# Patient Record
Sex: Female | Born: 1965 | Race: White | Hispanic: No | Marital: Married | Smoking: Never smoker
Health system: Southern US, Community
[De-identification: ages and names within clinical notes are randomized; demographics above are authoritative.]

## PROBLEM LIST (undated history)

## (undated) DIAGNOSIS — F419 Anxiety disorder, unspecified: Secondary | ICD-10-CM

## (undated) DIAGNOSIS — D649 Anemia, unspecified: Secondary | ICD-10-CM

## (undated) DIAGNOSIS — A6000 Herpesviral infection of urogenital system, unspecified: Secondary | ICD-10-CM

## (undated) DIAGNOSIS — E785 Hyperlipidemia, unspecified: Secondary | ICD-10-CM

## (undated) DIAGNOSIS — K219 Gastro-esophageal reflux disease without esophagitis: Secondary | ICD-10-CM

## (undated) HISTORY — DX: Gastro-esophageal reflux disease without esophagitis: K21.9

## (undated) HISTORY — DX: Anxiety disorder, unspecified: F41.9

## (undated) HISTORY — DX: Anemia, unspecified: D64.9

## (undated) HISTORY — DX: Hyperlipidemia, unspecified: E78.5

## (undated) HISTORY — DX: Herpesviral infection of urogenital system, unspecified: A60.00

---
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
## 1998-12-23 ENCOUNTER — Ambulatory Visit (HOSPITAL_COMMUNITY): Admission: RE | Admit: 1998-12-23 | Discharge: 1998-12-23 | Payer: Self-pay | Admitting: Neurosurgery

## 1998-12-23 ENCOUNTER — Encounter: Payer: Self-pay | Admitting: Neurosurgery

## 1999-04-24 ENCOUNTER — Ambulatory Visit (HOSPITAL_COMMUNITY): Admission: RE | Admit: 1999-04-24 | Discharge: 1999-04-24 | Payer: Self-pay | Admitting: *Deleted

## 1999-05-23 ENCOUNTER — Ambulatory Visit (HOSPITAL_COMMUNITY): Admission: RE | Admit: 1999-05-23 | Discharge: 1999-05-23 | Payer: Self-pay | Admitting: Neurosurgery

## 1999-05-23 ENCOUNTER — Encounter: Payer: Self-pay | Admitting: Neurosurgery

## 1999-12-06 ENCOUNTER — Encounter: Payer: Self-pay | Admitting: Neurosurgery

## 1999-12-06 ENCOUNTER — Ambulatory Visit (HOSPITAL_COMMUNITY): Admission: RE | Admit: 1999-12-06 | Discharge: 1999-12-06 | Payer: Self-pay | Admitting: Neurosurgery

## 2000-08-11 ENCOUNTER — Other Ambulatory Visit: Admission: RE | Admit: 2000-08-11 | Discharge: 2000-08-11 | Payer: Self-pay | Admitting: Obstetrics and Gynecology

## 2000-08-11 ENCOUNTER — Encounter: Admission: RE | Admit: 2000-08-11 | Discharge: 2000-08-11 | Payer: Self-pay | Admitting: *Deleted

## 2000-08-13 ENCOUNTER — Other Ambulatory Visit: Admission: RE | Admit: 2000-08-13 | Discharge: 2000-08-13 | Payer: Self-pay | Admitting: *Deleted

## 2000-08-20 ENCOUNTER — Encounter: Payer: Self-pay | Admitting: Obstetrics and Gynecology

## 2000-08-20 ENCOUNTER — Ambulatory Visit (HOSPITAL_COMMUNITY): Admission: RE | Admit: 2000-08-20 | Discharge: 2000-08-20 | Payer: Self-pay | Admitting: Obstetrics and Gynecology

## 2002-04-26 ENCOUNTER — Ambulatory Visit (HOSPITAL_COMMUNITY): Admission: RE | Admit: 2002-04-26 | Discharge: 2002-04-26 | Payer: Self-pay | Admitting: Gastroenterology

## 2003-01-02 ENCOUNTER — Other Ambulatory Visit: Admission: RE | Admit: 2003-01-02 | Discharge: 2003-01-02 | Payer: Self-pay | Admitting: Obstetrics and Gynecology

## 2003-06-10 ENCOUNTER — Encounter: Admission: RE | Admit: 2003-06-10 | Discharge: 2003-06-10 | Payer: Self-pay | Admitting: Neurosurgery

## 2003-06-10 ENCOUNTER — Encounter: Payer: Self-pay | Admitting: Neurosurgery

## 2003-07-09 ENCOUNTER — Encounter: Payer: Self-pay | Admitting: Diagnostic Radiology

## 2003-07-09 ENCOUNTER — Encounter: Admission: RE | Admit: 2003-07-09 | Discharge: 2003-07-09 | Payer: Self-pay | Admitting: Neurosurgery

## 2003-07-09 ENCOUNTER — Encounter: Payer: Self-pay | Admitting: Neurosurgery

## 2003-07-24 ENCOUNTER — Encounter: Admission: RE | Admit: 2003-07-24 | Discharge: 2003-07-24 | Payer: Self-pay | Admitting: Neurosurgery

## 2003-07-24 ENCOUNTER — Encounter: Payer: Self-pay | Admitting: Neurosurgery

## 2004-01-26 ENCOUNTER — Ambulatory Visit (HOSPITAL_COMMUNITY): Admission: RE | Admit: 2004-01-26 | Discharge: 2004-01-26 | Payer: Self-pay | Admitting: Emergency Medicine

## 2004-11-24 ENCOUNTER — Ambulatory Visit (HOSPITAL_COMMUNITY): Admission: RE | Admit: 2004-11-24 | Discharge: 2004-11-24 | Payer: Self-pay | Admitting: Orthopaedic Surgery

## 2004-11-28 ENCOUNTER — Encounter: Admission: RE | Admit: 2004-11-28 | Discharge: 2004-11-28 | Payer: Self-pay | Admitting: Orthopaedic Surgery

## 2005-04-13 ENCOUNTER — Emergency Department (HOSPITAL_COMMUNITY): Admission: EM | Admit: 2005-04-13 | Discharge: 2005-04-13 | Payer: Self-pay | Admitting: Family Medicine

## 2005-04-27 ENCOUNTER — Encounter: Admission: RE | Admit: 2005-04-27 | Discharge: 2005-07-26 | Payer: Self-pay | Admitting: Endocrinology

## 2005-11-20 HISTORY — PX: CHOLECYSTECTOMY: SHX55

## 2005-11-25 ENCOUNTER — Encounter: Admission: RE | Admit: 2005-11-25 | Discharge: 2005-11-25 | Payer: Self-pay | Admitting: Obstetrics and Gynecology

## 2005-11-27 ENCOUNTER — Inpatient Hospital Stay (HOSPITAL_COMMUNITY): Admission: AD | Admit: 2005-11-27 | Discharge: 2005-11-29 | Payer: Self-pay | Admitting: Family Medicine

## 2005-11-27 ENCOUNTER — Emergency Department (HOSPITAL_COMMUNITY): Admission: EM | Admit: 2005-11-27 | Discharge: 2005-11-27 | Payer: Self-pay | Admitting: Family Medicine

## 2005-11-28 ENCOUNTER — Encounter (INDEPENDENT_AMBULATORY_CARE_PROVIDER_SITE_OTHER): Payer: Self-pay | Admitting: Specialist

## 2006-11-26 ENCOUNTER — Encounter: Admission: RE | Admit: 2006-11-26 | Discharge: 2007-02-24 | Payer: Self-pay | Admitting: Endocrinology

## 2007-02-03 ENCOUNTER — Ambulatory Visit (HOSPITAL_BASED_OUTPATIENT_CLINIC_OR_DEPARTMENT_OTHER): Admission: RE | Admit: 2007-02-03 | Discharge: 2007-02-03 | Payer: Self-pay | Admitting: *Deleted

## 2007-02-06 ENCOUNTER — Ambulatory Visit: Payer: Self-pay | Admitting: Internal Medicine

## 2007-02-22 ENCOUNTER — Ambulatory Visit (HOSPITAL_COMMUNITY): Admission: RE | Admit: 2007-02-22 | Discharge: 2007-02-22 | Payer: Self-pay | Admitting: General Surgery

## 2007-02-24 ENCOUNTER — Ambulatory Visit (HOSPITAL_COMMUNITY): Admission: RE | Admit: 2007-02-24 | Discharge: 2007-02-24 | Payer: Self-pay | Admitting: General Surgery

## 2007-03-02 ENCOUNTER — Encounter: Admission: RE | Admit: 2007-03-02 | Discharge: 2007-05-31 | Payer: Self-pay | Admitting: Endocrinology

## 2007-04-13 ENCOUNTER — Emergency Department (HOSPITAL_COMMUNITY): Admission: EM | Admit: 2007-04-13 | Discharge: 2007-04-13 | Payer: Self-pay | Admitting: Family Medicine

## 2007-04-19 ENCOUNTER — Emergency Department (HOSPITAL_COMMUNITY): Admission: EM | Admit: 2007-04-19 | Discharge: 2007-04-19 | Payer: Self-pay | Admitting: Emergency Medicine

## 2007-05-02 ENCOUNTER — Ambulatory Visit: Admission: RE | Admit: 2007-05-02 | Discharge: 2007-05-02 | Payer: Self-pay | Admitting: General Surgery

## 2007-05-03 ENCOUNTER — Encounter: Admission: RE | Admit: 2007-05-03 | Discharge: 2007-05-03 | Payer: Self-pay | Admitting: General Surgery

## 2007-05-05 ENCOUNTER — Ambulatory Visit (HOSPITAL_COMMUNITY): Admission: RE | Admit: 2007-05-05 | Discharge: 2007-05-05 | Payer: Self-pay | Admitting: Surgery

## 2007-06-27 ENCOUNTER — Ambulatory Visit (HOSPITAL_COMMUNITY): Admission: RE | Admit: 2007-06-27 | Discharge: 2007-06-27 | Payer: Self-pay | Admitting: Surgery

## 2007-07-04 ENCOUNTER — Inpatient Hospital Stay (HOSPITAL_COMMUNITY): Admission: RE | Admit: 2007-07-04 | Discharge: 2007-07-06 | Payer: Self-pay | Admitting: General Surgery

## 2007-07-04 HISTORY — PX: GASTRIC BYPASS: SHX52

## 2007-07-07 ENCOUNTER — Ambulatory Visit: Payer: Self-pay | Admitting: Vascular Surgery

## 2007-07-07 ENCOUNTER — Ambulatory Visit (HOSPITAL_COMMUNITY): Admission: RE | Admit: 2007-07-07 | Discharge: 2007-07-07 | Payer: Self-pay | Admitting: General Surgery

## 2007-07-07 ENCOUNTER — Encounter (INDEPENDENT_AMBULATORY_CARE_PROVIDER_SITE_OTHER): Payer: Self-pay | Admitting: General Surgery

## 2007-07-11 ENCOUNTER — Encounter: Admission: RE | Admit: 2007-07-11 | Discharge: 2007-10-09 | Payer: Self-pay | Admitting: Endocrinology

## 2007-08-05 ENCOUNTER — Emergency Department (HOSPITAL_COMMUNITY): Admission: EM | Admit: 2007-08-05 | Discharge: 2007-08-05 | Payer: Self-pay | Admitting: Family Medicine

## 2007-08-11 ENCOUNTER — Encounter: Admission: RE | Admit: 2007-08-11 | Discharge: 2007-08-11 | Payer: Self-pay | Admitting: General Surgery

## 2007-08-30 ENCOUNTER — Ambulatory Visit: Payer: Self-pay | Admitting: Family Medicine

## 2007-08-30 DIAGNOSIS — R5383 Other fatigue: Secondary | ICD-10-CM

## 2007-08-30 DIAGNOSIS — E785 Hyperlipidemia, unspecified: Secondary | ICD-10-CM | POA: Insufficient documentation

## 2007-08-30 DIAGNOSIS — F418 Other specified anxiety disorders: Secondary | ICD-10-CM

## 2007-08-30 DIAGNOSIS — R5381 Other malaise: Secondary | ICD-10-CM

## 2007-08-30 DIAGNOSIS — E282 Polycystic ovarian syndrome: Secondary | ICD-10-CM

## 2007-08-30 HISTORY — DX: Polycystic ovarian syndrome: E28.2

## 2007-08-30 HISTORY — DX: Other specified anxiety disorders: F41.8

## 2007-09-08 ENCOUNTER — Telehealth: Payer: Self-pay | Admitting: Family Medicine

## 2007-09-08 LAB — CONVERTED CEMR LAB
ALT: 77 units/L — ABNORMAL HIGH (ref 0–35)
AST: 64 units/L — ABNORMAL HIGH (ref 0–37)
Albumin: 3.8 g/dL (ref 3.5–5.2)
Alkaline Phosphatase: 76 units/L (ref 39–117)
BUN: 4 mg/dL — ABNORMAL LOW (ref 6–23)
Basophils Absolute: 0 10*3/uL (ref 0.0–0.1)
Basophils Relative: 0.3 % (ref 0.0–1.0)
Bilirubin, Direct: 0.1 mg/dL (ref 0.0–0.3)
CO2: 26 meq/L (ref 19–32)
Calcium: 9.1 mg/dL (ref 8.4–10.5)
Chloride: 107 meq/L (ref 96–112)
Creatinine, Ser: 0.6 mg/dL (ref 0.4–1.2)
Eosinophils Absolute: 0.1 10*3/uL (ref 0.0–0.6)
Eosinophils Relative: 1.8 % (ref 0.0–5.0)
Ferritin: 122.4 ng/mL (ref 10.0–291.0)
GFR calc Af Amer: 142 mL/min
GFR calc non Af Amer: 118 mL/min
Glucose, Bld: 90 mg/dL (ref 70–99)
HCT: 39 % (ref 36.0–46.0)
Hemoglobin: 13.5 g/dL (ref 12.0–15.0)
Iron: 61 ug/dL (ref 42–145)
Lymphocytes Relative: 31.1 % (ref 12.0–46.0)
MCHC: 34.5 g/dL (ref 30.0–36.0)
MCV: 89.1 fL (ref 78.0–100.0)
Monocytes Absolute: 0.3 10*3/uL (ref 0.2–0.7)
Monocytes Relative: 6 % (ref 3.0–11.0)
Neutro Abs: 3.5 10*3/uL (ref 1.4–7.7)
Neutrophils Relative %: 60.8 % (ref 43.0–77.0)
Platelets: 202 10*3/uL (ref 150–400)
Potassium: 3.7 meq/L (ref 3.5–5.1)
RBC: 4.38 M/uL (ref 3.87–5.11)
RDW: 14.9 % — ABNORMAL HIGH (ref 11.5–14.6)
Sodium: 141 meq/L (ref 135–145)
TSH: 1.02 microintl units/mL (ref 0.35–5.50)
Total Bilirubin: 0.7 mg/dL (ref 0.3–1.2)
Total Protein: 6.3 g/dL (ref 6.0–8.3)
Transferrin: 258.2 mg/dL (ref >=212.0)
WBC: 5.7 10*3/uL (ref 4.5–10.5)

## 2007-09-16 ENCOUNTER — Ambulatory Visit: Payer: Self-pay | Admitting: Family Medicine

## 2007-09-23 LAB — CONVERTED CEMR LAB
ALT: 59 units/L — ABNORMAL HIGH (ref 0–35)
AST: 43 units/L — ABNORMAL HIGH (ref 0–37)
Albumin: 4.3 g/dL (ref 3.5–5.2)
Alkaline Phosphatase: 84 units/L (ref 39–117)
Bilirubin, Direct: <0.1 mg/dL (ref 0.0–0.3)
GGT: 10 units/L (ref 7–51)
HCV Ab: NEGATIVE
Hep A IgM: NEGATIVE
Hep B C IgM: NEGATIVE
Hepatitis B Surface Ag: NEGATIVE
Total Bilirubin: 0.6 mg/dL (ref 0.3–1.2)
Total Protein: 6.6 g/dL (ref 6.0–8.3)

## 2007-11-03 ENCOUNTER — Ambulatory Visit (HOSPITAL_COMMUNITY): Admission: RE | Admit: 2007-11-03 | Discharge: 2007-11-03 | Payer: Self-pay | Admitting: Surgery

## 2007-11-08 ENCOUNTER — Encounter: Admission: RE | Admit: 2007-11-08 | Discharge: 2008-02-06 | Payer: Self-pay | Admitting: General Surgery

## 2008-04-04 ENCOUNTER — Encounter: Admission: RE | Admit: 2008-04-04 | Discharge: 2008-04-04 | Payer: Self-pay | Admitting: General Surgery

## 2008-04-26 ENCOUNTER — Emergency Department (HOSPITAL_COMMUNITY): Admission: EM | Admit: 2008-04-26 | Discharge: 2008-04-26 | Payer: Self-pay | Admitting: Emergency Medicine

## 2008-04-29 ENCOUNTER — Emergency Department (HOSPITAL_COMMUNITY): Admission: EM | Admit: 2008-04-29 | Discharge: 2008-04-29 | Payer: Self-pay | Admitting: Family Medicine

## 2008-07-06 ENCOUNTER — Emergency Department (HOSPITAL_COMMUNITY): Admission: EM | Admit: 2008-07-06 | Discharge: 2008-07-06 | Payer: Self-pay | Admitting: Emergency Medicine

## 2008-07-06 ENCOUNTER — Encounter: Admission: RE | Admit: 2008-07-06 | Discharge: 2008-07-06 | Payer: Self-pay | Admitting: General Surgery

## 2008-08-02 ENCOUNTER — Encounter: Payer: Self-pay | Admitting: Family Medicine

## 2008-08-21 ENCOUNTER — Telehealth (INDEPENDENT_AMBULATORY_CARE_PROVIDER_SITE_OTHER): Payer: Self-pay | Admitting: *Deleted

## 2008-08-21 ENCOUNTER — Ambulatory Visit: Payer: Self-pay | Admitting: Family Medicine

## 2008-08-31 ENCOUNTER — Ambulatory Visit: Payer: Self-pay | Admitting: Family Medicine

## 2008-08-31 DIAGNOSIS — E538 Deficiency of other specified B group vitamins: Secondary | ICD-10-CM

## 2008-08-31 DIAGNOSIS — IMO0001 Reserved for inherently not codable concepts without codable children: Secondary | ICD-10-CM

## 2008-08-31 DIAGNOSIS — D509 Iron deficiency anemia, unspecified: Secondary | ICD-10-CM

## 2008-08-31 DIAGNOSIS — E559 Vitamin D deficiency, unspecified: Secondary | ICD-10-CM | POA: Insufficient documentation

## 2008-08-31 HISTORY — DX: Vitamin D deficiency, unspecified: E55.9

## 2008-08-31 HISTORY — DX: Iron deficiency anemia, unspecified: D50.9

## 2008-08-31 LAB — CONVERTED CEMR LAB: Vit D, 1,25-Dihydroxy: 22 — ABNORMAL LOW (ref 30–89)

## 2008-09-03 ENCOUNTER — Telehealth (INDEPENDENT_AMBULATORY_CARE_PROVIDER_SITE_OTHER): Payer: Self-pay | Admitting: *Deleted

## 2008-09-07 ENCOUNTER — Ambulatory Visit: Payer: Self-pay | Admitting: Family Medicine

## 2008-09-14 ENCOUNTER — Ambulatory Visit: Payer: Self-pay | Admitting: Family Medicine

## 2008-09-14 ENCOUNTER — Telehealth (INDEPENDENT_AMBULATORY_CARE_PROVIDER_SITE_OTHER): Payer: Self-pay | Admitting: *Deleted

## 2008-10-05 ENCOUNTER — Ambulatory Visit: Payer: Self-pay | Admitting: Family Medicine

## 2008-10-07 LAB — CONVERTED CEMR LAB
Basophils Absolute: 0 10*3/uL (ref 0.0–0.1)
Basophils Relative: 0.2 % (ref 0.0–3.0)
Eosinophils Absolute: 0.1 10*3/uL (ref 0.0–0.7)
Eosinophils Relative: 1.4 % (ref 0.0–5.0)
HCT: 41.2 % (ref 36.0–46.0)
Hemoglobin: 14.1 g/dL (ref 12.0–15.0)
Lymphocytes Relative: 37.9 % (ref 12.0–46.0)
MCHC: 34.2 g/dL (ref 30.0–36.0)
MCV: 93.4 fL (ref 78.0–100.0)
Monocytes Absolute: 0.3 10*3/uL (ref 0.1–1.0)
Monocytes Relative: 7.5 % (ref 3.0–12.0)
Neutro Abs: 2.3 10*3/uL (ref 1.4–7.7)
Neutrophils Relative %: 53 % (ref 43.0–77.0)
Platelets: 193 10*3/uL (ref 150–400)
RBC: 4.41 M/uL (ref 3.87–5.11)
RDW: 12.1 % (ref 11.5–14.6)
Vitamin B-12: 408 pg/mL (ref 211–911)
WBC: 4.3 10*3/uL — ABNORMAL LOW (ref 4.5–10.5)

## 2008-10-08 ENCOUNTER — Encounter (INDEPENDENT_AMBULATORY_CARE_PROVIDER_SITE_OTHER): Payer: Self-pay | Admitting: *Deleted

## 2008-10-08 LAB — CONVERTED CEMR LAB: Vit D, 1,25-Dihydroxy: 44 (ref 30–89)

## 2008-10-30 ENCOUNTER — Telehealth (INDEPENDENT_AMBULATORY_CARE_PROVIDER_SITE_OTHER): Payer: Self-pay | Admitting: *Deleted

## 2009-04-15 ENCOUNTER — Ambulatory Visit: Payer: Self-pay | Admitting: Family Medicine

## 2009-04-16 ENCOUNTER — Encounter (INDEPENDENT_AMBULATORY_CARE_PROVIDER_SITE_OTHER): Payer: Self-pay | Admitting: *Deleted

## 2009-04-17 ENCOUNTER — Encounter (INDEPENDENT_AMBULATORY_CARE_PROVIDER_SITE_OTHER): Payer: Self-pay | Admitting: *Deleted

## 2009-04-24 ENCOUNTER — Telehealth (INDEPENDENT_AMBULATORY_CARE_PROVIDER_SITE_OTHER): Payer: Self-pay | Admitting: *Deleted

## 2009-07-25 ENCOUNTER — Telehealth (INDEPENDENT_AMBULATORY_CARE_PROVIDER_SITE_OTHER): Payer: Self-pay | Admitting: *Deleted

## 2010-01-07 ENCOUNTER — Ambulatory Visit: Payer: Self-pay | Admitting: Family Medicine

## 2010-01-07 DIAGNOSIS — R319 Hematuria, unspecified: Secondary | ICD-10-CM

## 2010-01-07 HISTORY — DX: Hematuria, unspecified: R31.9

## 2010-01-07 LAB — CONVERTED CEMR LAB
Bilirubin Urine: NEGATIVE
Glucose, Urine, Semiquant: NEGATIVE
Ketones, urine, test strip: NEGATIVE
Nitrite: NEGATIVE
Specific Gravity, Urine: 1.02
Urobilinogen, UA: 0.2
WBC Urine, dipstick: NEGATIVE
pH: 6

## 2010-01-08 ENCOUNTER — Encounter: Payer: Self-pay | Admitting: Family Medicine

## 2010-01-10 ENCOUNTER — Telehealth: Payer: Self-pay | Admitting: Family Medicine

## 2010-01-13 ENCOUNTER — Telehealth (INDEPENDENT_AMBULATORY_CARE_PROVIDER_SITE_OTHER): Payer: Self-pay | Admitting: *Deleted

## 2010-01-21 ENCOUNTER — Ambulatory Visit: Payer: Self-pay | Admitting: Family Medicine

## 2010-01-21 DIAGNOSIS — N390 Urinary tract infection, site not specified: Secondary | ICD-10-CM

## 2010-06-03 ENCOUNTER — Ambulatory Visit: Payer: Self-pay | Admitting: Internal Medicine

## 2010-06-03 DIAGNOSIS — R21 Rash and other nonspecific skin eruption: Secondary | ICD-10-CM | POA: Insufficient documentation

## 2010-06-09 LAB — CONVERTED CEMR LAB
Basophils Absolute: 0 10*3/uL (ref 0.0–0.1)
Basophils Relative: 0.2 % (ref 0.0–3.0)
Eosinophils Absolute: 0.1 10*3/uL (ref 0.0–0.7)
Eosinophils Relative: 1.6 % (ref 0.0–5.0)
HCT: 39.5 % (ref 36.0–46.0)
Hemoglobin: 13.8 g/dL (ref 12.0–15.0)
Lymphocytes Relative: 23.9 % (ref 12.0–46.0)
Lymphs Abs: 1.4 10*3/uL (ref 0.7–4.0)
MCHC: 35 g/dL (ref 30.0–36.0)
MCV: 91.2 fL (ref 78.0–100.0)
Monocytes Absolute: 0.4 10*3/uL (ref 0.1–1.0)
Monocytes Relative: 7.1 % (ref 3.0–12.0)
Neutro Abs: 4 10*3/uL (ref 1.4–7.7)
Neutrophils Relative %: 67.2 % (ref 43.0–77.0)
Platelets: 238 10*3/uL (ref 150.0–400.0)
RBC: 4.33 M/uL (ref 3.87–5.11)
RDW: 12.9 % (ref 11.5–14.6)
WBC: 6 10*3/uL (ref 4.5–10.5)

## 2010-06-11 ENCOUNTER — Encounter: Admission: RE | Admit: 2010-06-11 | Discharge: 2010-06-12 | Payer: Self-pay | Admitting: General Surgery

## 2010-10-15 ENCOUNTER — Ambulatory Visit: Payer: Self-pay | Admitting: Family Medicine

## 2010-10-15 DIAGNOSIS — J069 Acute upper respiratory infection, unspecified: Secondary | ICD-10-CM | POA: Insufficient documentation

## 2010-10-15 DIAGNOSIS — J029 Acute pharyngitis, unspecified: Secondary | ICD-10-CM | POA: Insufficient documentation

## 2011-01-11 ENCOUNTER — Encounter: Payer: Self-pay | Admitting: General Surgery

## 2011-01-12 ENCOUNTER — Ambulatory Visit
Admission: RE | Admit: 2011-01-12 | Discharge: 2011-01-12 | Payer: Self-pay | Source: Home / Self Care | Attending: Family | Admitting: Family

## 2011-01-12 DIAGNOSIS — R599 Enlarged lymph nodes, unspecified: Secondary | ICD-10-CM

## 2011-01-12 HISTORY — DX: Enlarged lymph nodes, unspecified: R59.9

## 2011-01-14 ENCOUNTER — Ambulatory Visit: Payer: Self-pay | Admitting: Cardiovascular Disease

## 2011-01-15 ENCOUNTER — Telehealth: Payer: Self-pay | Admitting: Family Medicine

## 2011-01-18 LAB — CONVERTED CEMR LAB
ALT: 18 units/L (ref 0–35)
AST: 22 units/L (ref 0–37)
Albumin: 4 g/dL (ref 3.5–5.2)
Alkaline Phosphatase: 96 units/L (ref 39–117)
BUN: 8 mg/dL (ref 6–23)
Basophils Absolute: 0.1 10*3/uL (ref 0.0–0.1)
Basophils Relative: 0.9 % (ref 0.0–3.0)
Bilirubin, Direct: 0.1 mg/dL (ref 0.0–0.3)
CO2: 31 meq/L (ref 19–32)
Calcium: 8.8 mg/dL (ref 8.4–10.5)
Chloride: 105 meq/L (ref 96–112)
Cholesterol: 174 mg/dL (ref 0–200)
Creatinine, Ser: 0.8 mg/dL (ref 0.4–1.2)
Eosinophils Absolute: 0.1 10*3/uL (ref 0.0–0.7)
Eosinophils Relative: 0.9 % (ref 0.0–5.0)
Ferritin: 22.9 ng/mL (ref 10.0–291.0)
Folate: 6.7 ng/mL
GFR calc non Af Amer: 83.38 mL/min (ref >60)
Glucose, Bld: 96 mg/dL (ref 70–99)
HCT: 42.3 % (ref 36.0–46.0)
HDL: 47.2 mg/dL (ref >39.00)
Hemoglobin: 14.6 g/dL (ref 12.0–15.0)
Iron: 78 ug/dL (ref 42–145)
LDL Cholesterol: 116 mg/dL — ABNORMAL HIGH (ref 0–99)
Lymphocytes Relative: 30.5 % (ref 12.0–46.0)
Lymphs Abs: 1.9 10*3/uL (ref 0.7–4.0)
MCHC: 34.4 g/dL (ref 30.0–36.0)
MCV: 93.9 fL (ref 78.0–100.0)
Monocytes Absolute: 0.3 10*3/uL (ref 0.1–1.0)
Monocytes Relative: 5.4 % (ref 3.0–12.0)
Neutro Abs: 3.8 10*3/uL (ref 1.4–7.7)
Neutrophils Relative %: 62.3 % (ref 43.0–77.0)
Platelets: 217 10*3/uL (ref 150.0–400.0)
Potassium: 4 meq/L (ref 3.5–5.1)
RBC: 4.5 M/uL (ref 3.87–5.11)
RDW: 12.4 % (ref 11.5–14.6)
Saturation Ratios: 14.9 % — ABNORMAL LOW (ref 20.0–50.0)
Sodium: 141 meq/L (ref 135–145)
TSH: 0.93 microintl units/mL (ref 0.35–5.50)
Total Bilirubin: 0.8 mg/dL (ref 0.3–1.2)
Total CHOL/HDL Ratio: 4
Total Protein: 6.8 g/dL (ref 6.0–8.3)
Transferrin: 373.7 mg/dL — ABNORMAL HIGH (ref 212.0–360.0)
Triglycerides: 56 mg/dL (ref 0.0–149.0)
VLDL: 11.2 mg/dL (ref 0.0–40.0)
Vit D, 25-Hydroxy: 44 ng/mL (ref 30–89)
Vitamin B-12: 266 pg/mL (ref 211–911)
WBC: 6.2 10*3/uL (ref 4.5–10.5)

## 2011-01-22 NOTE — Assessment & Plan Note (Signed)
Summary: RASH ON LEG-TICK BITE/CDJ   Vital Signs:  Patient profile:   45 year old female Weight:      202.6 pounds Temp:     98.5 degrees F oral Pulse rate:   72 / minute Resp:     15 per minute BP sitting:   122 / 76  (left arm) Cuff size:   large  Vitals Entered By: Shonna Chock (June 03, 2010 10:42 AM) CC: Rash on legs since Wed/Thursday of last week. Patient noticed a tick on her arm on June 1st and not sure if this rash on legs is related Comments REVIEWED MED LIST, PATIENT AGREED DOSE AND INSTRUCTION CORRECT      Primary Care Provider:  Laury Axon  CC:  Rash on legs since Wed/Thursday of last week. Patient noticed a tick on her arm on June 1st and not sure if this rash on legs is related.  History of Present Illness: Rash around ankles last week @ beach which was  progressing superiorly , but lighter today.Originally thought from sunscreen , but it was used diffusely & rash was localized to legs. Tick found 05/21/2010; ? not bitten. Rx: none except stopping sun screen & Dramaine last night    Allergies: 1)  ! Pcn  Review of Systems General:  Denies chills, fever, and sweats. Derm:  Complains of changes in color of skin; denies itching. Neuro:  Minor dizziness & headache  last night; minor headache  today w/o balance issues.  Physical Exam  General:  well-nourished,in no acute distress; alert,appropriate and cooperative throughout examination Neck:  No deformities, masses, or tenderness noted. Supple Lungs:  Normal respiratory effort, chest expands symmetrically. Lungs are clear to auscultation, no crackles or wheezes. Heart:  Normal rate and regular rhythm. S1 and S2 normal without gallop, murmur, click, rub.S4 with slurring Abdomen:  Bowel sounds positive,abdomen soft and non-tender without masses, organomegaly or hernias noted. Skin:  Faint subcutaneous eruthema of legs w/o increased heat or tenderness Cervical Nodes:  No lymphadenopathy noted Axillary Nodes:  No  palpable lymphadenopathy   Impression & Recommendations:  Problem # 1:  RASH-NONVESICULAR (ICD-782.1)  Orders: Venipuncture (81191) TLB-CBC Platelet - w/Differential (85025-CBCD) T-Lyme Disease (47829-56213) T- * Misc. Laboratory test [phone])  Problem # 2:  TICK BITE (ICD-E906.4)  unproven  Orders: Venipuncture (84696) TLB-CBC Platelet - w/Differential (85025-CBCD) T-Lyme Disease (29528-41324) T- * Misc. Laboratory test [phone])  Complete Medication List: 1)  Mvi  .... 2 once daily 2)  Tums For Calcium  .... 2 once daily 3)  Iron  .Marland Kitchen.. 1 by mouth once daily 4)  B Complex-b12 Tabs (B complex vitamins) .Marland Kitchen.. 1 by mouth once daily 5)  Vitamin D 72536 Unit Caps (Ergocalciferol) .Marland Kitchen.. 1 by mouth weekly 6)  Paxil 10 Mg Tabs (Paroxetine hcl) .Marland Kitchen.. 1 by mouth daily 7)  Nystatin 100000 Unit/ml Susp (Nystatin) .... Swish and spit four times a day. 8)  Doxycycline Hyclate 100 Mg Caps (Doxycycline hyclate) .Marland Kitchen.. 1 two times a day ;avoid direct sun  Patient Instructions: 1)  Hold all supplements until rash gone.Report Warning Signs  as discussed Prescriptions: DOXYCYCLINE HYCLATE 100 MG CAPS (DOXYCYCLINE HYCLATE) 1 two times a day ;AVOID direct sun  #20 x 0   Entered and Authorized by:   Marga Melnick MD   Signed by:   Marga Melnick MD on 06/03/2010   Method used:   Faxed to ...       [facility]* (retail)  [address] [place], [place]  [address]       Ph: [phone] or [phone]       Fax: [phone]   RxID:   [id number]

## 2011-01-22 NOTE — Assessment & Plan Note (Signed)
Summary: KNOT IN FRONT OF EAR/RH..........--rm 5   Vital Signs:  Patient profile:   46 year old female Height:      63.25 inches Weight:      210.50 pounds BMI:     37.13 Temp:     97.8 degrees F oral Pulse rate:   78 / minute Pulse rhythm:   regular Resp:     16 per minute BP sitting:   110 / 70  (right arm) Cuff size:   large  Vitals Entered By: Mervin Kung CMA Duncan Dull) (January 12, 2011 1:12 PM) CC: Pt states she has had a know in front of her left ear x 1 month. Has noticed decreased hearing in her left x 2 days. Mother has history of lymphoma. Is Patient Diabetic? No Pain Assessment Patient in pain? no      Comments Pt is no longer taking tums, iron,. b complex or flonase. Nicki Guadalajara Fergerson CMA (AAMA)  January 12, 2011 1:20 PM    Primary Care Provider:  Laury Axon  CC:  Pt states she has had a know in front of her left ear x 1 month. Has noticed decreased hearing in her left x 2 days. Mother has history of lymphoma..  History of Present Illness: Martha Hampton is a 45 year old female who presents today with complaint of "lump" in front of her left ear x 1 month.  Denies associated tenderness.  Some discoloration of skin beneath the left ear.  Notes a "tickle in her left ear and some "achey" feeling on the left back of her head.  Denies any associated fever.      Allergies: 1)  ! 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 non-tender mobile mass in front of left auricle approximately 1/2 to 3/4 cm in diameter.   Neck:  No deformities,  masses, or tenderness noted. Lungs:  Normal respiratory effort, chest expands symmetrically. Lungs are clear to auscultation, no crackles or wheezes. Heart:  Normal rate and regular rhythm. S1 and S2 normal without gallop, murmur, click, rub or other extra sounds. Extremities:  No peripheral edema is noted Skin:  mild erythema noted of the left side of neck without swelling Cervical Nodes:  No lymphadenopathy noted Axillary Nodes:  No palpable lymphadenopathy Inguinal Nodes:  No significant adenopathy   Impression & Recommendations:  Problem # 1:  ENLARGEMENT OF LYMPH NODES (ICD-785.6) Assessment New  45 year old female with enlarged lymph node noted L pre-auricular lymph node.  Will plan to evaluate further with CT of the neck given family history of lymphoma. Spoke with Dr. Chestine Spore radiologist on call and he stated that CT neck will include this level and recommended that CT be ordered with contrast.   Orders: Misc. Referral (Misc. Ref)  Complete Medication List: 1)  Mvi  .... 2 once daily 2)  Tums For Calcium  .... 2 once daily 3)  Iron  .Marland Kitchen.. 1 by mouth once daily 4)  B Complex-b12 Tabs (B complex vitamins) .Marland Kitchen.. 1 by mouth once daily 5)  Vitamin D 47829 Unit Caps (Ergocalciferol) .Marland Kitchen.. 1 by mouth weekly 6)  Paxil 10 Mg Tabs (Paroxetine hcl) .Marland Kitchen.. 1 by mouth  daily 7)  Claritin 10 Mg Tabs (Loratadine) .Marland Kitchen.. 1 by mouth once daily as needed 8)  Flonase 50 Mcg/act Susp (Fluticasone propionate) .... 2 sprays each nostril once daily  Patient Instructions: 1)  We will call you about further imaging. 2)  Please follow up with Dr. Laury Axon in 1 month.   Orders Added: 1)  Misc. Referral [Misc. Ref] 2)  Est. Patient Level IV [69629]    Current Allergies (reviewed today): ! PCN  Appended Document: KNOT IN FRONT OF EAR/RH..........--rm 5 Left message on pt's phone (with her permission) re: plan for CT neck and that she will be contacted by our referral coordinator with details.

## 2011-01-22 NOTE — Progress Notes (Signed)
Summary: RX  Phone Note Call from Patient Call back at [facility] Phone [phone]   Caller: Patient Summary of Call: PATIENT IS CAOMING WANTING A REFILL ON HER CIPRO TO BE CALL INTO THE [facility] Initial call taken by: Freddy Jaksch,  January 13, 2010 1:49 PM  Follow-up for Phone Call        see phone note. Army Fossa CMA  January 13, 2010 3:09 PM

## 2011-01-22 NOTE — Assessment & Plan Note (Signed)
Summary: uti?/kdc ok per [place]ille   Vital Signs:  Patient profile:   45 year old female Weight:      193 pounds Temp:     98.2 degrees F oral Pulse rate:   76 / minute Pulse rhythm:   regular BP sitting:   122 / 80  (left arm) Cuff size:   regular  Vitals Entered By: Army Fossa CMA (January 07, 2010 11:12 AM) CC: Pt c/o painful when urination, noticied lots of blood. , Dysuria   History of Present Illness:  Dysuria      This is a 45 year old woman who presents with Dysuria.  The symptoms began 1 day ago.  The patient complains of burning with urination and hematuria, but denies urinary frequency, urgency, vaginal discharge, vaginal itching, vaginal sores, and penile discharge.  Associated symptoms include abdominal pain.  The patient denies the following associated symptoms: nausea, vomiting, fever, shaking chills, flank pain, back pain, pelvic pain, and arthralgias.  The patient denies the following risk factors: diabetes, prior antibiotics, immunosuppression, history of GU anomaly, history of pyelonephritis, pregnancy, history of STD, and analgesic abuse.  History is significant for no urinary tract problems.  Pt used to have  UTIs frequently but has not had one in years.   She always sees blood first.     Current Medications (verified): 1)  Mvi .... 2 Once Daily 2)  Tums For Calcium .... 2 Once Daily 3)  Iron .Marland Kitchen.. 1 By Mouth Once Daily 4)  B Complex-B12   Tabs (B Complex Vitamins) .Marland Kitchen.. 1 By Mouth Once Daily 5)  Vitamin D 69629 Unit Caps (Ergocalciferol) .Marland Kitchen.. 1 By Mouth Weekly 6)  Paxil 10 Mg Tabs (Paroxetine Hcl) .Marland Kitchen.. 1 By Mouth Daily 7)  Cipro 500 Mg Tabs (Ciprofloxacin Hcl) .Marland Kitchen.. 1 By Mouth Two Times A Day  Allergies: 1)  ! Pcn  Past History:  Past medical, surgical, family and social histories (including risk factors) reviewed for relevance to current acute and chronic problems.  Past Medical History: Reviewed history from 08/31/2008 and no changes  required. Anxiety Hyperlipidemia Anemia-iron deficiency  Past Surgical History: Reviewed history from 08/30/2007 and no changes required. Gastric bypass:07/04/2007 Cholecystectomy:11/2005 LEFT ANKLE [id number]:4401  Family History: Reviewed history from 08/30/2007 and no changes required. MOTHER: LIVING FATHER;LIVING 2 SISTERS: LIVING  Family History Hypertension: MOTHER AND FATHER CANCER:MOTHER NON-HODGKINS MANTEL CELL RECTAL CANCER:MGM   Family History Breast cancer 1st degree relative <50: AUNT (MOTHER'S SIDE)  Social History: Reviewed history from 08/30/2007 and no changes required. Occupation: [place] [employer] Married  Review of Systems      See HPI  Physical Exam  General:  Well-developed,well-nourished,in no acute distress; alert,appropriate and cooperative throughout examination Abdomen:  Bowel sounds positive,abdomen soft and non-tender without masses, organomegaly or hernias noted. Msk:  no back pain Psych:  Cognition and judgment appear intact. Alert and cooperative with normal attention span and concentration. No apparent delusions, illusions, hallucinations   Impression & Recommendations:  Problem # 1:  HEMATURIA UNSPECIFIED (ICD-599.70) rto 2 weeks  Her updated medication list for this problem includes:    Cipro 500 Mg Tabs (Ciprofloxacin hcl) .Marland Kitchen... 1 by mouth two times a day  Orders: T-Culture, Urine (02725-36644) UA Dipstick w/o Micro (manual) (03474)  Discussed medication use and hematuria work-up.   Problem # 2:  ANXIETY (ICD-300.00)  Her updated medication list for this problem includes:    Paxil 10 Mg Tabs (Paroxetine hcl) .Marland Kitchen... 1 by mouth daily  Discussed medication use  and relaxation techniques.   Complete Medication List: 1)  Mvi  .... 2 once daily 2)  Tums For Calcium  .... 2 once daily 3)  Iron  .Marland Kitchen.. 1 by mouth once daily 4)  B Complex-b12 Tabs (B complex vitamins) .Marland Kitchen.. 1 by mouth once daily 5)  Vitamin D 60454 Unit  Caps (Ergocalciferol) .Marland Kitchen.. 1 by mouth weekly 6)  Paxil 10 Mg Tabs (Paroxetine hcl) .Marland Kitchen.. 1 by mouth daily 7)  Cipro 500 Mg Tabs (Ciprofloxacin hcl) .Marland Kitchen.. 1 by mouth two times a day  Prescriptions: PAXIL 10 MG TABS (PAROXETINE HCL) 1 by mouth daily  #30 x 11   Entered and Authorized by:   Loreen Freud DO   Signed by:   Loreen Freud DO on 01/07/2010   Method used:   Electronically to        [facility]* (retail)       [address] [place], [place]  [address]       Ph: [phone] or [phone]       Fax: [phone]   RxID:   [id number] CIPRO 500 MG TABS (CIPROFLOXACIN HCL) 1 by mouth two times a day  #10 x 0   Entered and Authorized by:   Loreen Freud DO   Signed by:   Loreen Freud DO on 01/07/2010   Method used:   Electronically to        [facility]* (retail)       [address] [place], [place]  [address]       Ph: [phone] or [phone]       Fax: [phone]   RxID:   [id number]   Laboratory Results   Urine Tests    Routine Urinalysis   Color: orange Appearance: Hazy Glucose: negative   (Normal Range: Negative) Bilirubin: negative   (Normal Range: Negative) Ketone: negative   (Normal Range: Negative) Spec. Gravity: 1.020   (Normal Range: 1.003-1.035) Blood: large   (Normal Range: Negative) pH: 6.0   (Normal Range: 5.0-8.0) Protein: trace   (Normal Range: Negative) Urobilinogen: 0.2   (Normal Range: 0-1) Nitrite: negative   (Normal Range: Negative) Leukocyte Esterace: negative   (Normal Range: Negative)    Comments: Army Fossa CMA  January 07, 2010 11:21 AM

## 2011-01-22 NOTE — Progress Notes (Signed)
Summary: Culture Results  Phone Note Outgoing Call   Call placed by: [facility] CMA,  January 10, 2010 10:15 AM Summary of Call: left message to call  office................Marland KitchenFelecia Deloach CMA  January 10, 2010 10:15 AM   + UTI---treated with cipro  Follow-up for Phone Call        Spoke with patient, patient aware of Culture results. Patient said the blood is gone but still with a little pressure and she feels like she is getting thrush in her mouth form ABX. Patient requested that another round of ABX be called in and a mouthwash  Dr.Lowne please futher advise  [facility]. Follow-up by: Shonna Chock,  January 13, 2010 2:42 PM  Additional Follow-up for Phone Call Additional follow up Details #1::        levaquin 500 mg 1 by mouth once daily for 5 days nystatin swish and spit  #150 cc  5 ml swish and spit qid  Additional Follow-up by: Loreen Freud DO,  January 13, 2010 3:46 PM    Additional Follow-up for Phone Call Additional follow up Details #2::    pt is aware. Army Fossa CMA  January 13, 2010 4:51 PM   New/Updated Medications: LEVAQUIN 500 MG TABS (LEVOFLOXACIN) 1 by mouth once daily for 5 day. NYSTATIN 100000 UNIT/ML SUSP (NYSTATIN) swish and spit four times a day. Prescriptions: NYSTATIN 100000 UNIT/ML SUSP (NYSTATIN) swish and spit four times a day.  #150cc x 0   Entered by:   Army Fossa CMA   Authorized by:   Loreen Freud DO   Signed by:   Army Fossa CMA on 01/13/2010   Method used:   Electronically to        [facility]* (retail)       [address] [place], [place]  [address]       Ph: [phone] or [phone]       Fax: [phone]   RxID:   [id number] LEVAQUIN 500 MG TABS (LEVOFLOXACIN) 1 by mouth once daily for 5 day.  #5 x 0   Entered by:   Army Fossa CMA   Authorized by:   Loreen Freud DO   Signed by:   Army Fossa CMA on 01/13/2010   Method  used:   Electronically to        [facility]* (retail)       [address] [place], [place]  [address]       Ph: [phone] or [phone]       Fax: [phone]   RxID:   [id number]

## 2011-01-22 NOTE — Assessment & Plan Note (Signed)
Summary: STREP TEST//PH   Vital Signs:  Patient profile:   45 year old female Weight:      205.2 pounds Temp:     97.0 degrees F oral Pulse rate:   84 / minute Pulse rhythm:   regular BP sitting:   110 / 62  (left arm) Cuff size:   large  Vitals Entered By: Almeta Monas CMA Duncan Dull) (October 15, 2010 10:55 AM) CC: c/o sore throat, post nasal drip and clogged ears, URI symptoms   History of Present Illness:       This is a 45 year old woman who presents with URI symptoms.  The symptoms began 4 days ago.  The patient complains of nasal congestion, sore throat, dry cough, earache, and sick contacts, but denies clear nasal discharge and purulent nasal discharge.  The patient denies fever, low-grade fever (<100.5 degrees), fever of 100.5-103 degrees, fever of 103.1-104 degrees, fever to >104 degrees, stiff neck, dyspnea, wheezing, rash, vomiting, diarrhea, use of an antipyretic, and response to antipyretic.  The patient also reports itchy throat.  The patient denies itchy watery eyes, sneezing, seasonal symptoms, response to antihistamine, headache, muscle aches, and severe fatigue.  The patient denies the following risk factors for Strep sinusitis: unilateral facial pain, unilateral nasal discharge, poor response to decongestant, double sickening, tooth pain, Strep exposure, tender adenopathy, and absence of cough.  Pt only taking sudafed.  Current Medications (verified): 1)  Mvi .... 2 Once Daily 2)  Tums For Calcium .... 2 Once Daily 3)  Iron .Marland Kitchen.. 1 By Mouth Once Daily 4)  B Complex-B12   Tabs (B Complex Vitamins) .Marland Kitchen.. 1 By Mouth Once Daily 5)  Vitamin D 04540 Unit Caps (Ergocalciferol) .Marland Kitchen.. 1 By Mouth Weekly 6)  Paxil 10 Mg Tabs (Paroxetine Hcl) .Marland Kitchen.. 1 By Mouth Daily 7)  Claritin 10 Mg Tabs (Loratadine) .Marland Kitchen.. 1 By Mouth Once Daily As Needed 8)  Flonase 50 Mcg/act Susp (Fluticasone Propionate) .... 2 Sprays Each Nostril Once Daily  Allergies (verified): 1)  ! Pcn  Past  History:  Family History: Last updated: 08/30/2007 MOTHER: LIVING FATHER;LIVING 2 SISTERS: LIVING  Family History Hypertension: MOTHER AND FATHER CANCER:MOTHER NON-HODGKINS MANTEL CELL RECTAL CANCER:MGM   Family History Breast cancer 1st degree relative <50: AUNT (MOTHER'S SIDE)  Social History: Last updated: 08/30/2007 Occupation: [place] [employer] Married  Risk Factors: Exercise: yes (08/30/2007)  Risk Factors: Smoking Status: never (08/30/2007)  Past medical, surgical, family and social histories (including risk factors) reviewed for relevance to current acute and chronic problems.  Past Medical History: Reviewed history from 08/31/2008 and no changes required. Anxiety Hyperlipidemia Anemia-iron deficiency  Past Surgical History: Reviewed history from 08/30/2007 and no changes required. Gastric bypass:07/04/2007 Cholecystectomy:11/2005 LEFT ANKLE [id number]:7829  Family History: Reviewed history from 08/30/2007 and no changes required. MOTHER: LIVING FATHER;LIVING 2 SISTERS: LIVING  Family History Hypertension: MOTHER AND FATHER CANCER:MOTHER NON-HODGKINS MANTEL CELL RECTAL CANCER:MGM   Family History Breast cancer 1st degree relative <50: AUNT (MOTHER'S SIDE)  Social History: Reviewed history from 08/30/2007 and no changes required. Occupation: [place] [employer] Married  Review of Systems      See HPI  Physical Exam  General:  Well-developed,well-nourished,in no acute distress; alert,appropriate and cooperative throughout examination Ears:  External ear exam shows no significant lesions or deformities.  Otoscopic examination reveals clear canals, tympanic membranes are intact bilaterally without bulging, retraction, inflammation or discharge. Hearing is grossly normal bilaterally. Nose:  mucosal erythema and mucosal edema.  Clear drainage. Mouth:  pharyngeal erythema and postnasal drip.   Neck:  No deformities, masses, or  tenderness noted. Lungs:  Normal respiratory effort, chest expands symmetrically. Lungs are clear to auscultation, no crackles or wheezes. Heart:  Normal rate and regular rhythm. S1 and S2 normal without gallop, murmur, click, rub or other extra sounds. Psych:  Oriented X3 and normally interactive.     Impression & Recommendations:  Problem # 1:  URI (ICD-465.9)  Instructed on symptomatic treatment. Call if symptoms persist or worsen.   Her updated medication list for this problem includes:    Claritin 10 Mg Tabs (Loratadine) .Marland Kitchen... 1 by mouth once daily as needed  Orders: Rapid Strep ([address])  Complete Medication List: 1)  Mvi  .... 2 once daily 2)  Tums For Calcium  .... 2 once daily 3)  Iron  .Marland Kitchen.. 1 by mouth once daily 4)  B Complex-b12 Tabs (B complex vitamins) .Marland Kitchen.. 1 by mouth once daily 5)  Vitamin D 60454 Unit Caps (Ergocalciferol) .Marland Kitchen.. 1 by mouth weekly 6)  Paxil 10 Mg Tabs (Paroxetine hcl) .Marland Kitchen.. 1 by mouth daily 7)  Claritin 10 Mg Tabs (Loratadine) .Marland Kitchen.. 1 by mouth once daily as needed 8)  Flonase 50 Mcg/act Susp (Fluticasone propionate) .... 2 sprays each nostril once daily Prescriptions: FLONASE 50 MCG/ACT SUSP (FLUTICASONE PROPIONATE) 2 sprays each nostril once daily  #1 x 1   Entered and Authorized by:   Loreen Freud DO   Signed by:   Loreen Freud DO on 10/15/2010   Method used:   Electronically to        [facility]* (retail)       [address] [place], [place]  [address]       Ph: [phone] or [phone]       Fax: [phone]   RxID:   [id number] CLARITIN 10 MG TABS (LORATADINE) 1 by mouth once daily as needed  #30 x 5   Entered and Authorized by:   Loreen Freud DO   Signed by:   Loreen Freud DO on 10/15/2010   Method used:   Electronically to        [facility]* (retail)       [address] [place], [place]  [address]        Ph: [phone] or [phone]       Fax: [phone]   RxID:   [id number]    Orders Added: 1)  Est. Patient Level III [70623] 2)  Rapid Strep [76283]

## 2011-01-22 NOTE — Assessment & Plan Note (Signed)
Summary: 2 WEEK FU/NS/KDC   Vital Signs:  Patient profile:   45 year old female Height:      63.25 inches Weight:      198 pounds BMI:     34.92 Temp:     98.7 degrees F oral Pulse rate:   80 / minute Pulse rhythm:   regular BP sitting:   124 / 80  (left arm) Cuff size:   regular  Vitals Entered By: Army Fossa CMA (January 21, 2010 8:18 AM) CC: 2 week follow up- UTI symptoms have cleared up.    History of Present Illness: Pt here for f/u UTI-- No complaints.    Current Medications (verified): 1)  Mvi .... 2 Once Daily 2)  Tums For Calcium .... 2 Once Daily 3)  Iron .Marland Kitchen.. 1 By Mouth Once Daily 4)  B Complex-B12   Tabs (B Complex Vitamins) .Marland Kitchen.. 1 By Mouth Once Daily 5)  Vitamin D 16109 Unit Caps (Ergocalciferol) .Marland Kitchen.. 1 By Mouth Weekly 6)  Paxil 10 Mg Tabs (Paroxetine Hcl) .Marland Kitchen.. 1 By Mouth Daily 7)  Nystatin 100000 Unit/ml Susp (Nystatin) .... Swish and Spit Four Times A Day.  Allergies: 1)  ! Pcn  Past History:  Past medical, surgical, family and social histories (including risk factors) reviewed for relevance to current acute and chronic problems.  Past Medical History: Reviewed history from 08/31/2008 and no changes required. Anxiety Hyperlipidemia Anemia-iron deficiency  Past Surgical History: Reviewed history from 08/30/2007 and no changes required. Gastric bypass:07/04/2007 Cholecystectomy:11/2005 LEFT ANKLE [id number]  Family History: Reviewed history from 08/30/2007 and no changes required. MOTHER: LIVING FATHER;LIVING 2 SISTERS: LIVING  Family History Hypertension: MOTHER AND FATHER CANCER:MOTHER NON-HODGKINS MANTEL CELL RECTAL CANCER:MGM   Family History Breast cancer 1st degree relative <50: AUNT (MOTHER'S SIDE)  Social History: Reviewed history from 08/30/2007 and no changes required. 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     Impression & Recommendations:  Problem # 1:  UTI (ICD-599.0) Assessment Improved  symptoms have resolved--rto as needed  The following medications were removed from the medication list:    Levaquin 500 Mg Tabs (Levofloxacin) .Marland Kitchen... 1 by mouth once daily for 5 day.  Orders: UA Dipstick w/o Micro (manual) (14782)  Complete Medication List: 1)  Mvi  .... 2 once daily 2)  Tums For Calcium  .... 2 once daily 3)  Iron  .Marland Kitchen.. 1 by mouth once daily 4)  B Complex-b12 Tabs (B complex vitamins) .Marland Kitchen.. 1 by mouth once daily 5)  Vitamin D 95621 Unit Caps (Ergocalciferol) .Marland Kitchen.. 1 by mouth weekly 6)  Paxil 10 Mg Tabs (Paroxetine hcl) .Marland Kitchen.. 1 by mouth daily 7)  Nystatin 100000 Unit/ml Susp (Nystatin) .... Swish and spit four times a day.

## 2011-01-22 NOTE — Progress Notes (Signed)
Summary: CT results  Phone Note Call from Patient Call back at Home Phone [phone] Call back at Work Phone [phone]   Caller: Patient Summary of Call: Pt called wanting results of CT. Pt seen on yesterday by [facility]. Pls advise...........Marland KitchenFelecia Deloach CMA  January 15, 2011 2:36 PM   Follow-up for Phone Call        normally the Dr or PA ordering the test gives the results--- but CT showed benign lymph node only Follow-up by: Loreen Freud DO,  January 15, 2011 2:59 PM  Additional Follow-up for Phone Call Additional follow up Details #1::        Discuss with patient...........Marland KitchenFelecia Deloach CMA  January 15, 2011 3:12 PM

## 2011-01-29 ENCOUNTER — Telehealth (INDEPENDENT_AMBULATORY_CARE_PROVIDER_SITE_OTHER): Payer: Self-pay | Admitting: *Deleted

## 2011-02-05 NOTE — Progress Notes (Signed)
Summary: refill  Phone Note Refill Request  on January 29, 2011 8:44 AM  Refills Requested: Medication #1:  PAXIL 10 MG TABS 1 by mouth daily [facility] - fax [phone] -- phone [phone]  Initial call taken by: Okey Regal [place],  January 29, 2011 8:45 AM  Follow-up for Phone Call        last written 01/07/10 with 11 refills-- last seen 01/12/11 by Melissa... CPX due...please advise Follow-up by: Almeta Monas CMA Duncan Dull),  January 29, 2011 9:13 AM  Additional Follow-up for Phone Call Additional follow up Details #1::        refill x1  2 refills ---pt should schedule cpe Additional Follow-up by: Loreen Freud DO,  January 29, 2011 12:06 PM    Prescriptions: PAXIL 10 MG TABS (PAROXETINE HCL) 1 by mouth daily  #30 x 2   Entered by:   Doristine Devoid CMA   Authorized by:   Loreen Freud DO   Signed by:   Doristine Devoid CMA on 01/29/2011   Method used:   Electronically to        [facility]* (retail)       [address] [place], [place]  [address]       Ph: [phone] or [phone]       Fax: [phone]   RxID:   [id number]

## 2011-04-15 ENCOUNTER — Encounter: Payer: Self-pay | Admitting: Family Medicine

## 2011-04-20 ENCOUNTER — Encounter: Payer: Self-pay | Admitting: Family Medicine

## 2011-04-20 ENCOUNTER — Ambulatory Visit (INDEPENDENT_AMBULATORY_CARE_PROVIDER_SITE_OTHER): Payer: 59 | Admitting: Family Medicine

## 2011-04-20 VITALS — BP 102/62 | HR 80 | Wt 215.6 lb

## 2011-04-20 DIAGNOSIS — J309 Allergic rhinitis, unspecified: Secondary | ICD-10-CM

## 2011-04-20 DIAGNOSIS — F411 Generalized anxiety disorder: Secondary | ICD-10-CM

## 2011-04-20 DIAGNOSIS — R5383 Other fatigue: Secondary | ICD-10-CM

## 2011-04-20 DIAGNOSIS — J302 Other seasonal allergic rhinitis: Secondary | ICD-10-CM

## 2011-04-20 DIAGNOSIS — F419 Anxiety disorder, unspecified: Secondary | ICD-10-CM

## 2011-04-20 DIAGNOSIS — R5381 Other malaise: Secondary | ICD-10-CM

## 2011-04-20 MED ORDER — PAROXETINE HCL 10 MG PO TABS: 10.0000 mg | ORAL_TABLET | Freq: Every day | ORAL | Status: DC

## 2011-04-20 MED ORDER — LORATADINE 10 MG PO TABS: 10.0000 mg | ORAL_TABLET | Freq: Every day | ORAL | Status: DC

## 2011-04-20 NOTE — Assessment & Plan Note (Signed)
con't paxil  rto prn or 1 year

## 2011-04-20 NOTE — Assessment & Plan Note (Signed)
Check labs Keep sleep schedule rto prn

## 2011-04-20 NOTE — Patient Instructions (Signed)
Refills sent to pharmacy. 

## 2011-04-20 NOTE — Progress Notes (Signed)
  Subjective:    Patient ID: Martha Hampton, female    DOB: 1966/04/13, 45 y.o.   MRN: [id number]  HPI Pt here c/o extreme fatigue and decreased memory for about 1 month.  She feels "cloudy"  And someone at work ask her what was wrong because she forgot something that occurs every work.        Review of Systems  Constitutional: Positive for diaphoresis. Negative for fever, chills, activity change, appetite change and unexpected weight change.  HENT: Negative.   Respiratory: Negative.   Cardiovascular: Negative.   Hematological: Negative for adenopathy.  skin--itching , no rash     Objective:   Physical Exam  Constitutional: She is oriented to person, place, and time. She appears well-developed and well-nourished.  Neck: Normal range of motion. Neck supple.  Cardiovascular: Normal rate, regular rhythm and normal heart sounds.   No murmur heard. Pulmonary/Chest: Effort normal and breath sounds normal.  Musculoskeletal: She exhibits no edema and no tenderness.  Lymphadenopathy:    She has no cervical adenopathy.  Neurological: She is alert and oriented to person, place, and time. She has normal reflexes.  Psychiatric: She has a normal mood and affect. Her behavior is normal. Judgment and thought content normal.          Assessment & Plan:

## 2011-04-21 LAB — CBC WITH DIFFERENTIAL/PLATELET
Basophils Absolute: 0 10*3/uL (ref 0.0–0.1)
Hemoglobin: 13.1 g/dL (ref 12.0–15.0)
Lymphocytes Relative: 43.6 % (ref 12.0–46.0)
Monocytes Relative: 11.8 % (ref 3.0–12.0)
Neutro Abs: 1.7 10*3/uL (ref 1.4–7.7)
Neutrophils Relative %: 42.5 % — ABNORMAL LOW (ref 43.0–77.0)
RBC: 4.32 Mil/uL (ref 3.87–5.11)
RDW: 13.6 % (ref 11.5–14.6)

## 2011-04-21 LAB — BASIC METABOLIC PANEL
CO2: 26 mEq/L (ref 19–32)
Chloride: 104 mEq/L (ref 96–112)
Creatinine, Ser: 0.6 mg/dL (ref 0.4–1.2)
Potassium: 5 mEq/L (ref 3.5–5.1)

## 2011-04-21 LAB — ALLERGY PROFILE REGION II-DC, DE, MD, ~~LOC~~, VA
Aspergillus fumigatus, IgG: <0.35 kU/L (ref <0.35)
Cladosporium Herbarum: <0.35 kU/L (ref <0.35)
Common Ragweed: <0.35 kU/L (ref <0.35)
Elm IgE: <0.35 kU/L (ref <0.35)
IgE (Immunoglobulin E), Serum: 66.1 IU/mL (ref 0.0–180.0)
Johnson Grass: <0.35 kU/L (ref <0.35)
Lamb's Quarters: <0.35 kU/L (ref <0.35)
Meadow Grass: <0.35 kU/L (ref <0.35)

## 2011-04-21 LAB — TSH: TSH: 0.64 u[IU]/mL (ref 0.35–5.50)

## 2011-04-21 LAB — VITAMIN B12: Vitamin B-12: >1500 pg/mL — ABNORMAL HIGH (ref 211–911)

## 2011-04-22 ENCOUNTER — Encounter: Payer: Self-pay | Admitting: *Deleted

## 2011-04-23 LAB — VITAMIN D 1,25 DIHYDROXY
Vitamin D 1, 25 (OH)2 Total: 94 pg/mL — ABNORMAL HIGH (ref 18–72)
Vitamin D2 1, 25 (OH)2: <8 pg/mL
Vitamin D3 1, 25 (OH)2: 94 pg/mL

## 2011-05-05 NOTE — Op Note (Signed)
NAMELAURANNE, BEYERSDORF                ACCOUNT NO.:  [id number]   MEDICAL RECORD NO.:  [id number]          PATIENT TYPE:  AMB   LOCATION:  ENDO                         FACILITY:  [facility]   PHYSICIAN:  Sandria Bales. Ezzard Standing, M.D.  DATE OF BIRTH:  10/20/1966   DATE OF PROCEDURE:  06/27/2007  DATE OF DISCHARGE:                               OPERATIVE REPORT   PREOPERATIVE DIAGNOSIS:  History of pyloric channel ulcer on endoscopy  on May 05, 2007.   POSTOPERATIVE DIAGNOSIS:  Normal esophagus, stomach and duodenum with no  evidence of residual ulcer, esophagogastric junction at 40 cm.   PROCEDURE:  Esophagogastroduodenoscopy.   SURGEON:  Sandria Bales. Ezzard Standing, M.D.   FIRST ASSISTANT:  None.   ANESTHESIA:  50 mg of fentanyl and 4.5 mg of Versed.   COMPLICATIONS:  None.   PROCEDURE:  Ms. [name] is a 45 year old white female who is morbidly  obese who is considering gastric bypass.  Her surgery is scheduled in  one week.  I did an upper endoscopy on May 05, 2007, and found a  prepyloric channel ulcer.  She has undergone treatment.  Her abdominal  symptoms have resolved.  She now comes for repeat endoscopy to document  healing of this ulcer.   OPERATIVE NOTE:  The patient was placed in the left lateral decubitus position.  After  the back of her throat was anesthetized with Cetacaine, she was  monitored with an EKG, pulse oximetry, blood pressure cuff and placed on  2 liters nasal O2.   A flexible adult Pentax upper endoscope was passed down her esophagus,  into her stomach without difficulty.  I cannulated her duodenum, got  down to the second portion of the duodenum.  Her duodenum and duodenal  bulb were unremarkable.  I did create some trauma around the pylorus,  but I did a extensive exam of the pyloric channel and saw no evidence of  residual ulcer, mass or lesion.  Her stomach was unremarkable.  I  retroflexed the scope back.  The cardia was unremarkable.  The scope was  withdrawn to the  esophagogastric junction, which was at 40 cm.  The  esophagus itself was unremarkable.   The patient tolerated the procedure well.  She is scheduled for  bariatric surgery next Monday, which will be July 04, 2007, which I  think is okay to go ahead.      Sandria Bales. Ezzard Standing, M.D.  Electronically Signed     DHN/MEDQ  D:  06/27/2007  T:  06/27/2007  Job:  [id number]   cc:   Lorne Skeens. Hoxworth, M.D.  Tera Mater. Evlyn Kanner, M.D.

## 2011-05-05 NOTE — H&P (Signed)
NAMEGRADIE, BUTRICK                ACCOUNT NO.:  [id number]   MEDICAL RECORD NO.:  [id number]          PATIENT TYPE:  INP   LOCATION:  0103                         FACILITY:  [facility]   PHYSICIAN:  Ollen Gross. Vernell Morgans, M.D. DATE OF BIRTH:  05-Oct-1966   DATE OF ADMISSION:  07/06/2008  DATE OF DISCHARGE:                              HISTORY & PHYSICAL   Ms. Martha Hampton is a 45 year old white female who presented to the emergency  department today with epigastric pain that started acutely at about 4  p.m. this afternoon.  The patient is about a year out from a gastric  bypass procedure by Dr. Johna Sheriff.  She states that the pain feels like  her bowels want to move but cannot, although she does note flatus and  bowel movements today that have been normal.  She denies any nausea or  vomiting.  She denies any fevers.  The pain is a little bit better when  she lies back.  She otherwise denies any chest pain, shortness of  breath, diarrhea, dysuria.  The rest of her review of systems are  unremarkable.   PAST MEDICAL HISTORY:  1. Morbid obesity.  2. Borderline diabetes.   PAST SURGICAL HISTORY:  1. Cholecystectomy.  2. Gastric bypass.   MEDICATIONS:  Multivitamins.   ALLERGIES:  PENICILLIN.   SOCIAL HISTORY:  She denies the use of alcohol or tobacco products.   FAMILY HISTORY:  Noncontributory.   PHYSICAL EXAMINATION:  Temp 97.2, blood pressure 142/76, pulse 71.  O2  sat 98%.  GENERAL:  She is a well-developed and well-nourished white female in no  acute distress.  She has lost about 143 pounds.  SKIN:  Warm and dry.  No jaundice.  HEENT:  Extraocular muscles are intact.  Pupils are equal, round and  reactive to light.  Sclerae are anicteric.  LUNGS:  Clear bilaterally with no use of accessory respiratory muscles.  HEART:  Regular rate and rhythm with known pulse in the left chest.  ABDOMEN:  Soft.  She does have some point tenderness in the epigastric  region but no guarding, no  peritonitis.  The rest of her abdomen is very  soft and benign.  No palpable mass or hepatosplenomegaly.  No abdominal  distention.  EXTREMITIES:  No clubbing, cyanosis or edema with good strength of her  arms and legs.  Psychologically, she is alert and oriented with no evidence of anxiety  or depression.   Labs are pending.   ASSESSMENT/PLAN:  This is a 45 year old white female with acute onset of  epigastric pain about a year out from gastric bypass.  At this point,  probably the most worrisome things might be an internal hernia or a  marginal ulcer.  Will begin by admitting her to the hospital for bowel rest and IV  hydration.  Will check her labs and abdominal x-rays and will discuss  her situation with the bariatric team.  She may also need a CT scan of  her abdomen and pelvis tonight.      Ollen Gross. Vernell Morgans, M.D.  Electronically Signed  PST/MEDQ  D:  07/06/2008  T:  07/06/2008  Job:  [id number]

## 2011-05-05 NOTE — Op Note (Signed)
Martha Hampton, INGMAN                ACCOUNT NO.:  [id number]   MEDICAL RECORD NO.:  [id number]          PATIENT TYPE:  AMB   LOCATION:  ENDO                         FACILITY:  [facility]   PHYSICIAN:  Sandria Bales. Ezzard Standing, M.D.  DATE OF BIRTH:  [birth year]   DATE OF PROCEDURE:  11/03/2007  DATE OF DISCHARGE:                               OPERATIVE REPORT   PREOPERATIVE DIAGNOSIS:  Questionable problems with Roux-en-Y gastric  bypass.   POSTOPERATIVE DIAGNOSIS:  Normal gastrojejunal anastomosis at 40 cm,  esophagogastric junction at 36 cm, normal post Roux-en-Y anatomy.   PROCEDURE:  Esophagogastroduodenoscopy.   SURGEON:  Dr. Ovidio Kin.   ANESTHESIA:  50 mg of Fentanyl, 5 mg of Versed.   COMPLICATIONS:  None.   PROCEDURE:  Mrs. [name] had a Roux-en-Y gastric bypass by Dr. Jaclynn Guarneri on July 04, 2007. She has had trouble with regurgitation of  food and has been eating carbohydrates, to  slip food by her pouch.  However, she has had fairly successful weight loss, but she has a  preoperative weight of 288. She says her weight was even greater than  that when she first started.  She says her weight is currently 227.   The indications of the procedure and complications were explained to the  patient. The patient has actually had upper endoscopy, so she  understands what she goes on   PROCEDURE:  The patient was placed in a sitting position. The back of he  throat was anesthetized with Cetacaine. She was then rolled on her left  side. She had an IV in her left arm. She had nasal oxygen pulse  oximetry, blood pressure cuff and EKG monitoring.   She was given 5 mg of Versed, 50 mg of Fentanyl and flexible Pentax  endoscope was passed down her esophagus without difficulty.   I got into the gastric pouch identified the gastrojejunal anastomosis  and passed down about 40 cm down to the jejunum. I found no obstruction,  mass or tumor. At the gastrojejunal anastomosis about 40 cm from the  incisors, easily admitted the width of the endoscope, which was about 8  or 9 mm, there was a stable anastomosis, which I pulled through but  there was no other irritation or inflammation of the pouch itself. It  was about 4 cm in size and appeared normal with no ulcer or  inflammation. The esophagogastric junction was noted about 36 cm. The  distal esophagus looked normal though she said on x-rays that it showed  to be dilated.   This appeared to be normal upper endoscopy post Roux-en-Y gastric  bypass. I took photos of this, which I will give the patient with the  chart. She tolerated the procedure well. She will go home today. I spoke  to her and her husband after the case.  She sees Dr. Johna Sheriff in two or  three weeks for follow up.      Sandria Bales. Ezzard Standing, M.D.  Electronically Signed     DHN/MEDQ  D:  11/03/2007  T:  11/04/2007  Job:  [id number]   cc:  Lorne Skeens. Hoxworth, M.D.  [address]  [place]  [place] [address]

## 2011-05-05 NOTE — Discharge Summary (Signed)
NAMEKASSADIE, PANCAKE                ACCOUNT NO.:  [id number]   MEDICAL RECORD NO.:  [id number]          PATIENT TYPE:  INP   LOCATION:  1607                         FACILITY:  [facility]   PHYSICIAN:  Sharlet Salina T. Hoxworth, M.D.DATE OF BIRTH:  12/31/1965   DATE OF ADMISSION:  07/04/2007  DATE OF DISCHARGE:  07/06/2007                               DISCHARGE SUMMARY   DISCHARGE DIAGNOSIS:  Morbid obesity.   OPERATIONS AND PROCEDURES:  Laparoscopic Roux-en-Y gastric bypass, Dr.  Johna Sheriff, July 04, 2007,   HISTORY OF PRESENT ILLNESS:  Jamisen Hawes is a 45 year old female with  progressive morbid obesity unresponsive to medical management.  Following extensive preoperative evaluation and teaching, she is  admitted for laparoscopic right gastric bypass.   PAST MEDICAL HISTORY:  Previous surgery includes laparoscopic  cholecystectomy and ankle reconstruction.  She is followed medically for  mild OCD, elevated cholesterol.   MEDICATIONS:  Paxil and Lipitor daily.   ALLERGIES:  PENICILLIN.   SOCIAL HISTORY, FAMILY HISTORY AND REVIEW OF SYSTEMS:  See H&P.   PERTINENT PHYSICAL EXAMINATION:  Weight is 290 pounds, BMI 51.  Her  physical exam was otherwise unremarkable except for obesity.   HOSPITAL COURSE:  On the morning of admission, she underwent an  uneventful laparoscopic Roux-en-Y gastric bypass.  Her postoperative  course was smooth and uncomplicated.  Gastrografin swallow postop day #1  showed no leak or obstruction.  Lower extremity Doppler showed no  evidence of DVT.  Lab was unremarkable with white count of 8, hemoglobin  of 13.  She was begun on clear liquids on the first postoperative day  which she tolerated well and was advanced to protein shake on the second  postoperative day.  At this point, her white count remained normal.  Abdomen was nontender.  She was afebrile.  She was discharged home.   DISCHARGE MEDICATIONS:  Are the same as admission.   FOLLOWUP:  With me in my  office in 1 week.      Lorne Skeens. Hoxworth, M.D.  Electronically Signed     BTH/MEDQ  D:  07/19/2007  T:  07/20/2007  Job:  [id number]   cc:   Tera Mater. Evlyn Kanner, M.D.  Fax: [phone]

## 2011-05-05 NOTE — Op Note (Signed)
NAMEANTHONIA, Martha Hampton                ACCOUNT NO.:  [id number]   MEDICAL RECORD NO.:  [id number]          PATIENT TYPE:  AMB   LOCATION:  ENDO                         FACILITY:  [facility]   PHYSICIAN:  Sandria Bales. Ezzard Standing, M.D.  DATE OF BIRTH:  [birth year]   DATE OF PROCEDURE:  DATE OF DISCHARGE:                               OPERATIVE REPORT   PREOP DIAGNOSIS:  Abdominal pain   POSTOP DIAGNOSIS:  Small prepyloric channel ulcer   PROCEDURE:  Esophagoduodenoscopy, biopsy for CLO test   SURGEON:  Ovidio Kin   ANESTHESIA:  50 mcg Fentanyl, 5 mg Versed   INDICATION:  Ms. [name] is a 44 year old white female who is morbidly obese who is  considering gastric bypass.  She has developed some epigastric abdominal  pain and Dr. Johna Sheriff who is evaluating her and seeing her for the  surgery has asked me to endoscope her preoperatively.   The indications, potential complications of endoscopy are explained to  the patient.   OPERATIVE NOTE:  The patient is placed in a left lateral decubitus  position after the back of her throat was anesthetized with Cetacaine.  We gave her 50 mcg of fentanyl, 5 mg of Versed.  I passed the Pentax  endoscope down her esophagus without difficulty.   I advanced the scope into the duodenum.  The first and second portion of  the duodenum were unremarkable.  The pylorus was unremarkable.  However,  about 3-4 cm proximal to the pylorus was a small ulcer.  Multiple  pictures were taken.  This ulcer appears benign.  It actually appears to  be healing.   I retroflexed the scope within the stomach.  I visualized the cardia and  GE junction, otherwise, her stomach was unremarkable.  I did take a  biopsy for CLO.  I then withdrew the scope to the EG junction, which was  at about 38 cm.  Her esophagus was unremarkable.   I took pictures of this and gave them to her mother-in-law so she had  copies of this, but this may very well delay her surgery.  I told her I  would be in  touch with Dr. Johna Sheriff.  I went on and gave her Protonix 40  mg twice a day to start her for treatment in this with CLOtest pending.      Sandria Bales. Ezzard Standing, M.D.  Electronically Signed     DHN/MEDQ  D:  05/05/2007  T:  05/05/2007  Job:  [id number]   cc:   Sharlet Salina T. Hoxworth, M.D.

## 2011-05-05 NOTE — Op Note (Signed)
[name], Hampton                ACCOUNT NO.:  [id number]   MEDICAL RECORD NO.:  [id number]          PATIENT TYPE:  INP   LOCATION:  1607                         FACILITY:  [facility]   PHYSICIAN:  Sharlet Salina T. Hoxworth, M.D.DATE OF BIRTH:  1966-04-01   DATE OF PROCEDURE:  07/04/2007  DATE OF DISCHARGE:                               OPERATIVE REPORT   PRE-AND-POSTOPERATIVE DIAGNOSIS:  Morbid obesity.   SURGICAL PROCEDURES:  Laparoscopic Roux-en-Y gastric bypass.   SURGEON:  Lorne Skeens. Hoxworth, M.D.   ASSISTANT:  Thornton Park. Daphine Deutscher, MD   ANESTHESIA:  General.   BRIEF HISTORY:  Martha Hampton is a 45 year old female with a long history  of progressive morbid obesity unresponsive to medical management.  Her  initial presentation was a BMI of 51.  Following extensive preoperative  discussion and workup, we have elected to proceed with laparoscopic Roux-  en-Y gastric bypass for surgical treatment of her obesity.  The  procedure and risks have been discussed extensively and detailed  elsewhere.  The patient is now brought to operating room for this  procedure.   DESCRIPTION OF OPERATION:  The patient has undergone a mechanical  antibiotic bowel prep.  She was brought to the operating room, placed in  the supine position on the operating table; and a general endotracheal  anesthesia was induced.  She received preoperative broad-spectrum  antibiotics.  Lovenox 40 mg had been given subcutaneously  preoperatively.  PAS were placed. The abdomen was widely sterilely  prepped and draped.  Correct patient and procedure were verified.   Access was obtained with an 11-mm OptiVu trocar in the left subcostal  space without difficulty and pneumoperitoneum established.  Under direct  vision, a 12-mm trocar was placed in the right paraxiphoid area through  the falciform ligament, in the right upper quadrant, in the left  periumbilical area for the camera port, and an additional 5-mm trocar  was  placed in the left flank.   The omentum was elevated, mesocolon elevated, and ligament of Treitz  carefully identified.  A 40-cm afferent limb was measured; at which  point the mesentery was mobile with the bowel extending easily up to the  inferior edge of liver.  The small intestine was divided, at this point,  with a single firing of the white load 45-mm stapler.  Mesentery was  divided a short distance further with the harmonic scalpel.  Penrose  drain was sutured to the end of the Roux limb for identification.  A 100-  cm Roux limb was then carefully measured.  At this point, a side-to-side  anastomosis was created between the end of the biliopancreatic limb and  the side of the Roux limb with a single firing of the white load 45 mm  stapler.  The staple line was inspected; and there was no bleeding.  The  common enterotomy was then closed with running 2-0 Vicryl begun at  either end and tied centrally.   The mesenteric defect below the jejunojejunostomy was closed with a  running 2-0 silk begun at the base and extended up onto the bowel.  Staple  and suture lines were then coated with Tisseel tissue sealant.  The patient was then placed in steep reverse Trendelenburg and through a  5-mm subxiphoid site, the Nathanson's retractor was placed, and the left  lobe of the liver elevated with very good exposure of the hiatus and  upper stomach.  The peritoneum along the angle of His was incised, and  blunt dissection carried back down toward the posterior stomach with the  finger retractor.  At this point, a 4-cm long gastric pouch was measured  from the EG junction along the lesser curve.   The peritoneum along the lesser curve was incised with the harmonic  scalpel, and dissecting just along the stomach working posteriorly with  the harmonic scalpel; and blunt dissection, the free lesser sac was  entered.  All tubes were removed from the stomach.  Initial firing of  the gold 60-mm  stapler applied for about 4 cm to right angles along the  lesser curve was made.  Two further firings of a 60-mm blue load stapler  up through the previously dissected area of the angle of His performed  to create a nice small gastric pouch.  This staple line of the gastric  remnant was oversewn with a 2-0 silk.  The Roux limb was then brought up  with left facing candy cane to the gastric pouch.  It was sutured to the  pouch along the staple line with a running 2-0 Vicryl suture.   The Maricela Curet was then advanced into the pouch, and enterotomies were made  with the harmonic scalpel and the Roux limb in the gastric pouch.  Approximately a 2 cm anastomosis was then created with a single firing  of the blue load 45-mm linear stapler.  Staple line, again, was  inspected.  It was intact without bleeding.  The common enterotomy was  then closed with running 2-0 Vicryl begun at either end of the staple  line and tied centrally.  The closure was a little longer along the Roux  limb; and there were two small gaps noted at the end of the closure; and  these were additionally closed with interrupted simple sutures of 2-0  Vicryl with good closure of the common enterotomy.  The Ewald tube was  then passed down through the anastomosis into the Roux limb; and an  outer seromuscular running 2-0 Vicryl suture was placed.   Following this with the Roux limb clamped at the outlet, the patient was  endoscoped by Dr. Daphine Deutscher and with the pouch tightly distended, with air  under saline, there was no evidence of leak.  The pouch was  decompressed.  Saline was suctioned.  The abdomen was carefully  inspected for hemostasis.  The Vonita Moss defect had been closed prior to  the endoscopy, closing the edge of the Roux mesentery down to the  transverse mesocolon and transverse colon with running 2-0 silk.  Tisseel tissue sealant was placed over the staple and suture lines.  The  viscera were carefully inspect for any  evidence of trocar injury, and  none seen.  Trocars removed and all CO2 evacuated.  Skin incisions were  closed with staples.  Sponge, needle, and instrument counts were  correct.  Dry dressings were applied; and the patient taken to recovery  in good condition.      Lorne Skeens. Hoxworth, M.D.  Electronically Signed     BTH/MEDQ  D:  07/04/2007  T:  07/04/2007  Job:  [id number]

## 2011-05-06 ENCOUNTER — Telehealth: Payer: Self-pay | Admitting: *Deleted

## 2011-05-06 ENCOUNTER — Encounter: Payer: Self-pay | Admitting: *Deleted

## 2011-05-06 DIAGNOSIS — J309 Allergic rhinitis, unspecified: Secondary | ICD-10-CM

## 2011-05-06 NOTE — Telephone Encounter (Addendum)
Left message to call office, copy of labs mailed  Message copied by Candie Echevaria on Wed May 06, 2011 10:36 AM ------      Message from: Loreen Freud      Created: Tue May 05, 2011 11:31 AM       Negative--- it doesn't test for everything --we can still refer to allergy if pt would like

## 2011-05-08 ENCOUNTER — Encounter: Payer: Self-pay | Admitting: Family Medicine

## 2011-05-08 NOTE — Procedures (Signed)
[place]  Patient:    Martha Hampton, Martha Hampton Visit Number: [id number] MRN: [id number]          Service Type: END Location: ENDO Attending Physician:  Charna Elizabeth Dictated by:   Anselmo Rod, M.D. Proc. Date: 04/26/02 Admit Date:  04/26/2002   CC:         Viviann Spare A. Cleta Alberts, M.D.   Procedure Report  DATE OF BIRTH:  [birth year].  REFERRING PHYSICIAN:  Maylon Peppers. Cleta Alberts, M.D.  PROCEDURE PERFORMED:  Colonoscopy.  ENDOSCOPIST:  Anselmo Rod, M.D.  INSTRUMENT USED:  Olympus video colonoscope.  INDICATIONS FOR PROCEDURE:  Rectal bleeding with a history of rectal cancer in her maternal grandmother and family history of breast cancer in a 45 year old white female.  Rule out colonic polyps, masses, hemorrhoids, etc.  PREPROCEDURE PREPARATION:  Informed consent was procured from the patient. The patient was fasted for eight hours prior to the procedure and prepped with a bottle of magnesium citrate and a gallon of NuLytely the night prior to the procedure.  PREPROCEDURE PHYSICAL:  The patient had stable vital signs.  Neck supple. Chest clear to auscultation.  S1, S2 regular.  Abdomen soft with normal bowel sounds.  DESCRIPTION OF PROCEDURE:  The patient was placed in the left lateral decubitus position and sedated with an additional 25 mg of Demerol and 1 mg of Versed intravenously.  Once the patient was adequately sedated and maintained on low-flow oxygen and continuous cardiac monitoring, the Olympus video colonoscope was advanced from the rectum to the cecum.  There was some residual stool.  Multiple washes were done.  The patient had a few early left-sided diverticula.  Small internal hemorrhoids were appreciated on retroflexion in the rectum.  No masses or polyps were seen.  The appendiceal orifice and ileocecal valve were clearly visualized and photographed.  IMPRESSION: 1. Small nonbleeding internal hemorrhoids. 2. Few early  left-sided diverticula.  RECOMMENDATIONS: 1. A high fiber diet has been recommended for the patient. 2. Repeat colorectal cancer screening is recommended in the next five    years considering her family history of colon cancer. 3. Outpatient follow-up is advised in the next two weeks.  Further    recommendations will be made at that time. Dictated by:   Anselmo Rod, M.D. Attending Physician:  Charna Elizabeth DD:  04/26/02 TD:  04/27/02 Job: [id number]

## 2011-05-08 NOTE — Op Note (Signed)
NAMEKASHAWN, MANZANO                ACCOUNT NO.:  [id number]   MEDICAL RECORD NO.:  [id number]          PATIENT TYPE:  INP   LOCATION:  5737                         FACILITY:  [facility]   PHYSICIAN:  Cherylynn Ridges, M.D.    DATE OF BIRTH:  Aug 15, 1966   DATE OF PROCEDURE:  11/28/2005  DATE OF DISCHARGE:  11/29/2005                                 OPERATIVE REPORT   PREOPERATIVE DIAGNOSIS:  Symptomatic gallstones.   POSTOPERATIVE DIAGNOSIS:  Symptomatic gallstones.   PROCEDURE:  Laparoscopic cholecystectomy.   SURGEON:  Cherylynn Ridges, M.D.   ANESTHESIA:  General endotracheal.   ESTIMATED BLOOD LOSS:  Less than 20 mL.   ASSISTANT:  Clovis Pu. Cornett, M.D.   SPECIMENS:  Gallbladder.   COMPLICATIONS:  None.   CONDITION:  Stable.   INDICATIONS FOR OPERATION:  The patient is a 45 year old with a history of  malignant hypothermia in her family, who comes in with symptomatic  gallstones for cholecystectomy.   FINDINGS:  The patient had probable chronic cholecystitis with  cholelithiasis. No evidence of acute disease.  Anatomy was otherwise normal.  She is morbidly obese.   OPERATION:  The patient was taken to the operating room, placed on the table  in supine position. After an adequate endotracheal anesthetic was  administered, she was prepped and draped in usual sterile manner exposing  the midline and right upper quadrant.   A supraumbilical curvilinear incision was made using #11 blade and taken  down to the midline fascia. It was at the level of the fascia that it was  grasped with two Kocher clamps and incision made in the fascia. Once we made  an incision in the fascia, we opened that into the peritoneum using blunt  dissection with a Kelly clamp. Once we had to penetrate the peritoneum,  while tenting up on it with the Kocher clamps, we placed in a pursestring  suture of 0 Vicryl which was used to secure in the [place] cannula which was  in place. We insufflated carbon  dioxide gas through the Hassan cannula up to  a maximal intra-abdominal pressure of 15 mmHg. Once this was done, two right  costal margin 5 mm cannulas and a subxiphoid 11 and 12 mm cannula were  passed under direct vision into the peritoneal cavity. The patient was  placed in reverse Trendelenburg.  The left side was tilted down and the  dissection begun.   The dome of the gallbladder was grasped using a ratcheted grasper through  the lateral most 5 mm cannula.  A second one was placed on the infundibulum.  We dissected out the peritoneum overlying the triangle of Calot and hepatic  duodenal triangle dissecting out the cystic duct and the cystic artery. We  had an adequate window around the cystic duct and we placed a clip across  the proximal and gallbladder side of the cystic duct and then attempted to  make a cholecystodochotomy the using the laparoscopic scissors. However,  because of the small size of the duct, we could not do so without perhaps  losing control of the  distal end of the cystic duct and therefore the  decision was made not to do a cholangiogram and to clip the distal duct with  three Endo clips. The patient's preoperative LFTs were normal.   Once we did so, we were able to find the cystic artery. We doubly ligated  that along the remaining side and singly on the gallbladder side. We then  dissected out the gallbladder from its bed with minimal difficulty, removing  it from the supraumbilical fascia using an EndoCatch bag. We tied off the  supraumbilical site with a pursestring suture and reinforced with figure-of-  eight stitches of 0 Vicryl. Once this was done, we inspected the gallbladder  bed. There was some bleeding which was cauterized and controlled. Once this  was done, we removed all gas and fluid using the aspirator and then removed  all cannulas.   A 0.7 Marcaine with epi was injected all sites and the we closed the skin  using running subcuticular stitch  of 5-0 Monocryl. Sterile dressing were  applied including Steri-Strips at all sites. All needle, sponge counts and  instrument counts were correct.      Cherylynn Ridges, M.D.  Electronically Signed     JOW/MEDQ  D:  11/28/2005  T:  11/29/2005  Job:  [id number]

## 2011-05-08 NOTE — Procedures (Signed)
[place]  Patient:    Martha Hampton, Martha Hampton Visit Number: [id number] MRN: [id number]          Service Type: END Location: ENDO Attending Physician:  Charna Elizabeth Dictated by:   Anselmo Rod, M.D. Proc. Date: 04/26/02 Admit Date:  04/26/2002   CC:         Viviann Spare A. Cleta Alberts, M.D.   Procedure Report  DATE OF BIRTH:  02/13/1966  REFERRING PHYSICIAN:  Maylon Peppers. Cleta Alberts, M.D.  PROCEDURE PERFORMED:  Esophagogastroduodenoscopy with biopsies.  ENDOSCOPIST:  Anselmo Rod, M.D.  INSTRUMENT USED:  Olympus video panendoscope;  INDICATIONS FOR PROCEDURE:  Epigastric pain and history of rectal bleeding in a 45 year old white female.  Rule out peptic ulcer disease, esophagitis, gastritis, etc.  PREPROCEDURE PREPARATION:  Informed consent was procured from the patient. The patient was fasted for eight hours prior to the procedure.  PREPROCEDURE PHYSICAL:  The patient had stable vital signs.  Neck supple. Chest clear to auscultation.  S1, S2 regular.  Abdomen soft with normal abdominal bowel sounds.  DESCRIPTION OF PROCEDURE:  The patient was placed in left lateral decubitus position and sedated with 75 mg of Demerol and 7 mg of Versed intravenously. Once the patient was adequately sedated and maintained on low-flow oxygen and continuous cardiac monitoring, the Olympus video panendoscope was advanced through the mouthpiece, over the tongue, into the esophagus under direct vision.  The entire esophagus, gastric mucosa and the proximal small bowel appeared normal.  IMPRESSION:  Normal esophagogastroduodenoscopy.  RECOMMENDATION:  Proceed with colonoscopy at this time. Dictated by:   Anselmo Rod, M.D. Attending Physician:  Charna Elizabeth DD:  04/26/02 TD:  04/27/02 Job: [id number]

## 2011-05-08 NOTE — H&P (Signed)
Martha Hampton                ACCOUNT NO.:  [id number]   MEDICAL RECORD NO.:  [id number]          PATIENT TYPE:  INP   LOCATION:  5731                         FACILITY:  [facility]   PHYSICIAN:  Sharlet Salina T. Hoxworth, M.D.DATE OF BIRTH:  [birth year]   DATE OF ADMISSION:  11/27/2005  DATE OF DISCHARGE:                                HISTORY & PHYSICAL   CHIEF COMPLAINT:  Abdominal pain.   HISTORY OF PRESENT ILLNESS:  Martha Hampton is a 45 year old white female who  was in her usual state of health until about two days ago.  At that time she  developed acute onset of mid epigastric abdominal pain.  This was not  particularly severe and then did seem to improve over the next 24 hours.  She had recurrence of her pain early this morning, again not particularly  severe.  She then, however, drank a milkshake later in the morning and had  the rapid onset of severe pain which she describes above her umbilicus in  the midline without radiation.  This was described as a severe burning pain.  It did come and go in waves.  She develops nausea and vomiting following  this.  She did have one diarrheal stool.  She denies any history of any  previous similar pain.  She presented to the Urgent [facility] at [facility] for evaluation.  She does have a history about a year ago some  intermittent mild left lower quadrant pain and underwent EGD and colonoscopy  by Dr. Anselmo Rod with reported findings of just diverticulosis and  this pain resolved.  She has not had fever chills.  No jaundice.  She, after  a long stay at the urgent care center this evening, has continued pain  although slightly improved with medication.   PAST MEDICAL HISTORY:   SURGERY:  Significant only for a left ankle fracture at age 57, done under  spinal anesthesia.   MEDICAL:  1.  She is followed for OCD.  2.  Elevated cholesterol.   MEDICATIONS:  1.  Birth control pills.  2.  Paxil 10 mg a day.  3.  Lipitor  daily.   Followed by Dr. Ardyth Harps.   ALLERGIES:  1.  PENICILLIN.  2.  Also noted is a strong family history of malignant hyperthermia in her      grandfather and proven in her sister with a muscle biopsy.   SOCIAL HISTORY:  She is married, employed, does not smoke cigarettes or  drink alcohol.   FAMILY HISTORY:  Grandfather died from apparent malignant hyperthermia.  Son  had a less well characterized severe reaction following anesthesia.   REVIEW OF SYSTEMS:  GENERAL:  No fever chills.  HEENT:  No vision, hearing,  swallowing problems.  RESPIRATORY:  Denies shortness of breath, cough,  wheezing.  CARDIAC:  Denies chest pain, palpitations, history of heart  disease.  ABDOMEN/GI:  As above.  GU:  No urinary burning, frequency,  hematuria.  EXTREMITIES:  Positive for some ankle swelling.  NEUROLOGIC:  No  syncope, numbness, weakness.   PHYSICAL  EXAMINATION:  VITAL SIGNS:  Temperature is 98, respirations 16,  blood pressure 145/95, pulse 90, O2 sat is 98%.  GENERAL:  She is a morbidly obese white female in no acute distress.  SKIN:  Warm and dry, no rash or infection.  HEENT:  No palpable mass or thyromegaly.  Sclerae not icteric.  Nares and  oropharynx clear.  LUNGS:  Clear without wheezing or increased work of breathing.  CARDIAC:  Regular rate and rhythm without murmurs. No edema.  No JVD.  LYMPH NODES:  No cervical, supraclavicular, inguinal nodes palpable.  ABDOMEN:  Obese.  Bowel sounds present.  There is well localized significant  tenderness in the epigastrium and mostly in the right upper quadrant with  guarding.  No discernable masses or organomegaly.  No hernias palpable.  EXTREMITIES:  Trace ankle edema.  No joint swelling.  NEUROLOGIC:  Alert, oriented.  Motor and sensory exam is grossly normal.   LABORATORY/X-RAY:  White count mildly elevated at 10.8, hemoglobin normal at  15.6.  Electrolytes, LFTs, amylase within normal limits.   Ultrasound of the gallbladder  was obtained which reveals cholelithiasis.  No  apparent gallbladder wall thickening or pericholecystic fluid.  Common bile  duct minimally dilated at 8.4-mm.   ASSESSMENT:  1.  Acute abdominal pain consistent with cholelithiasis and early      cholecystitis.  2.  Morbid obesity.  3.  Strong family history of malignant hyperthermia.   PLAN:  1.  The patient will be admitted.  2.  Made NPO.  3.  Given IV fluids.  4.  Treated for pain control.  5.  Given IV antibiotics.  6.  Anesthesia has been alerted regarding likelihood of malignant      hyperthermia.  7.  We will plan early laparoscopic cholecystectomy.  This was discussed      with the patient and her family.      Lorne Skeens. Hoxworth, M.D.  Electronically Signed     BTH/MEDQ  D:  11/27/2005  T:  11/27/2005  Job:  [id number]

## 2011-05-08 NOTE — Telephone Encounter (Signed)
Discuss with patient referral put in.

## 2011-05-08 NOTE — Procedures (Signed)
Martha Hampton, SHURTZ                ACCOUNT NO.:  [id number]   MEDICAL RECORD NO.:  [id number]          PATIENT TYPE:  OUT   LOCATION:  SLEEP CENTER                 FACILITY:  [facility]   PHYSICIAN:  Clinton D. Maple Hudson, MD, FCCP, FACPDATE OF BIRTH:  1966-12-09   DATE OF STUDY:  02/01/2007                            NOCTURNAL POLYSOMNOGRAM   REFERRING PHYSICIAN:  Alfonse Ras, MD   INDICATIONS FOR PROCEDURE:  Hypersomnia with sleep apnea.   EPWORTH SLEEPINESS SCORE:  8/24; BMI 54.4; weight 308 pounds.   HOME MEDICATIONS:  Lipitor and Paxil.   SLEEP ARCHITECTURE:  Total sleep time 256 minutes with sleep efficiency  61%. Stage 1 was 22%; Stage 2, 74%; Stages 3 and 4, 3%; REM absent.  Sleep latency 65 minutes, awake after sleep onset 103 minutes, arousal  index 4.9. No bedtime medication was taken. Overall impression is of  very frequent awakenings throughout the study.   RESPIRATORY DATA:  Apnea hypopnea index (AHI, RDI) 2.6 obstructive  events per hour, which is within normal limits (normal range 0 to 5 per  hour.) There were 3 obstructive apnea's and 8 hypopnea's. Events were  not positional, occurring mainly while she slept on her sides. There  were insufficiency events to qualify for CPAP titration by split  protocol, on this study night.   OXYGEN DATA:  Mild to moderate snoring with oxygen desaturation to a  nadir of 91%. Mean oxygen saturation through the study was 96% on room  air.   CARDIAC DATA:  Sinus rhythm with occasional PAC.   MOVEMENT/PARASOMNIA:  Rare limb jerk, insignificant.   IMPRESSION/RECOMMENDATION:  1. Short total sleep time, non-specific, with absent REM. The patient      was described as having difficulty maintaining sleep through the      night and this is seen in the pattern of frequent non-specific      waking's.  2. Occasional sleep disorder breathing events, AHI 2.6 per hour, which      is within normal limits ( 0 to 5 per hour.) Events were not    positional. Mild to moderate snoring associated with oxygen      desaturation to a nadir of 91%.      Clinton D. Maple Hudson, MD, [facility], FACP  Diplomate, Biomedical engineer of Sleep Medicine  Electronically Signed     CDY/MEDQ  D:  02/06/2007 11:42:54  T:  02/06/2007 21:17:30  Job:  [id number]

## 2011-06-15 ENCOUNTER — Encounter: Payer: Self-pay | Admitting: Internal Medicine

## 2011-06-16 ENCOUNTER — Institutional Professional Consult (permissible substitution): Payer: 59 | Admitting: Internal Medicine

## 2011-06-29 ENCOUNTER — Other Ambulatory Visit: Payer: Self-pay | Admitting: Obstetrics and Gynecology

## 2011-07-23 ENCOUNTER — Institutional Professional Consult (permissible substitution): Payer: 59 | Admitting: Internal Medicine

## 2011-07-23 NOTE — Progress Notes (Unsigned)
  Subjective:    Patient ID: Martha Hampton, female    DOB: April 17, 1966, 45 y.o.   MRN: [id number]  HPI    Review of Systems     Objective:   Physical Exam        Assessment & Plan:

## 2011-09-01 ENCOUNTER — Ambulatory Visit (INDEPENDENT_AMBULATORY_CARE_PROVIDER_SITE_OTHER): Payer: 59 | Admitting: Family Medicine

## 2011-09-01 ENCOUNTER — Encounter: Payer: Self-pay | Admitting: Family Medicine

## 2011-09-01 DIAGNOSIS — R1310 Dysphagia, unspecified: Secondary | ICD-10-CM

## 2011-09-01 NOTE — Progress Notes (Signed)
  Subjective:    Patient ID: Martha Hampton, female    DOB: 03-28-1966, 45 y.o.   MRN: [id number]  HPI Pt here c/o dysphagia and the feeling that food is getting stuck.  Eventually the food does go down but it is uncomfortable.  She used to take protonix from [facility] but they stopped giving it to her--- ( she has not been there since 2010).     Review of Systems As above    Objective:   Physical Exam  Constitutional: She is oriented to person, place, and time. She appears well-developed and well-nourished.  Cardiovascular: Normal rate, regular rhythm and normal heart sounds.   Pulmonary/Chest: Effort normal and breath sounds normal.  Abdominal: Soft. Bowel sounds are normal.  Neurological: She is alert and oriented to person, place, and time.  Psychiatric: She has a normal mood and affect. Her behavior is normal. Judgment and thought content normal.          Assessment & Plan:  Dysphagia/ gerd---  nexium samples given to pt                               We got [facility] on the phone while pt was here--they will get pt an appointment to f/u                               Pt given GI cocktail in office with relief.

## 2011-09-01 NOTE — Patient Instructions (Signed)
Esophagitis (Heartburn) Esophagitis (heartburn) is a painful, burning sensation in the chest. It may feel worse in certain positions, such as lying down or bending over. It is caused by stomach acid backing up into the tube that carries food from the mouth down to the stomach (lower esophagus). TREATMENT There are a number of non-prescription medicines used to treat heartburn, including:  Antacids.   Acid reducers (also called H-2 blockers).   Proton-pump inhibitors.  HOME CARE INSTRUCTIONS  Raise the head of your bead by putting blocks under the legs.   Eat 2-3 hours before going to bed.   Stop smoking.   Try to reach and maintain a healthy weight.   Do not eat just a few very large meals. Instead, eat many smaller meals throughout the day.   Try to identify foods and beverages that make your symptoms worse, and avoid these.   Avoid tight clothing.   Do not exercise right after eating.  SEEK IMMEDIATE MEDICAL CARE IF YOU:  Have severe chest pain that goes down your arm, or into your jaw or neck.   Feel sweaty, dizzy, or lightheaded.   Are short of breath.   Throw up (vomit) blood.   Have difficulty or pain with swallowing.   Have bloody or black, tarry stools.   Have bouts of heartburn more than three times a week for more than two weeks.  Document Released: 01/14/2005 Document Re-Released: 03/03/2010 [employer] Patient Information 2011 [employer]. 

## 2011-09-02 ENCOUNTER — Telehealth (INDEPENDENT_AMBULATORY_CARE_PROVIDER_SITE_OTHER): Payer: Self-pay | Admitting: General Surgery

## 2011-09-02 NOTE — Telephone Encounter (Signed)
PT CALLED YESTERDAY AFTERNOON/ 09-01-11 RE SWALLOWING ISSUES WHICH STARTED ON Sunday/ FOOD WAS GETTING STUCK AND HER THROAT FELT SORE. SOME EPIGASTRIC BURNING. SHE STATED SHE HAS HAD SIMILAR ISSUES SINCE SURGERY. I RELATED SYMPTOMS TO DR. HOXWORTH AND HE ORDERED PROTONIX 40MG  TO BE CALLED TO [facility]/ [id number]. PT TO CALL WITH PROGRESS REPORT. MAY NEED ENDOSCOPY AT A LATER DATE. PT AWARE

## 2011-09-04 ENCOUNTER — Ambulatory Visit (INDEPENDENT_AMBULATORY_CARE_PROVIDER_SITE_OTHER): Payer: 59 | Admitting: Surgery

## 2011-09-04 ENCOUNTER — Telehealth (INDEPENDENT_AMBULATORY_CARE_PROVIDER_SITE_OTHER): Payer: Self-pay | Admitting: General Surgery

## 2011-09-04 ENCOUNTER — Encounter (INDEPENDENT_AMBULATORY_CARE_PROVIDER_SITE_OTHER): Payer: Self-pay | Admitting: Surgery

## 2011-09-04 DIAGNOSIS — K219 Gastro-esophageal reflux disease without esophagitis: Secondary | ICD-10-CM

## 2011-09-04 DIAGNOSIS — Z9884 Bariatric surgery status: Secondary | ICD-10-CM

## 2011-09-04 NOTE — Telephone Encounter (Signed)
I CALLED PT AT SAME TIME SHE WAS CALLING IN. I WAS CHECKING ON HER SYMPTOMS AND IF PROTONIX WAS HELPING. PT STATED SHE DID NOT PICK-UP PROTONIX DUE TO COST. SHE IS TAKING NEXIUM. NOT HELPING SYMPTOMS. PT C/O PAIN WHEN SHE SWALLOWS. PAIN IN CHEST AND BACK AND ALSO SHE STATES SHE HAS SHORTNESS OF BREATH WITH THE PAIN. ALSO I SPOKE WITH DR. Sallye Lat EVENING RE ENDOSCOPY POSSIBILITY. HE SAID SHE SHOULD BE SEEN FIRST BEFORE ENDO. I CONVEYED THIS TO PT ALSO. I TALKED WITH DR. HOXWORTH IN OR RE PT'S SYMPTOMS. HE SAID FOR HER TO BE SEEN TODAY. PT IS SCHEDULED TO SEE DR. Sheron Nightingale THIS PM.

## 2011-09-04 NOTE — Progress Notes (Signed)
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  She has had problems with GER since her bypass.  Her weight is down to 217 from 288.  She has been lost to followup since September 2010.  She is seen in the urgent office but this is a chronic problem.  Will get UGI and have her followup with Dr. Johna Sheriff.  If she has a significant HH then perhaps he can go in and repair it.

## 2011-09-07 ENCOUNTER — Ambulatory Visit
Admission: RE | Admit: 2011-09-07 | Discharge: 2011-09-07 | Disposition: A | Discharge status: Home / Self Care | Payer: 59 | Source: Ambulatory Visit | Attending: Surgery | Admitting: Surgery

## 2011-09-07 DIAGNOSIS — Z9884 Bariatric surgery status: Secondary | ICD-10-CM

## 2011-09-18 LAB — COMPREHENSIVE METABOLIC PANEL
Albumin: 3.8
BUN: 8
CO2: 23
Chloride: 105
Creatinine, Ser: 0.58
GFR calc non Af Amer: >60
Total Bilirubin: 1.1

## 2011-09-18 LAB — PROTIME-INR
INR: 1.1
Prothrombin Time: 14.5

## 2011-09-18 LAB — URINALYSIS, ROUTINE W REFLEX MICROSCOPIC
Glucose, UA: NEGATIVE
Hgb urine dipstick: NEGATIVE
Specific Gravity, Urine: 1.014
Urobilinogen, UA: 1
pH: 6

## 2011-09-18 LAB — POCT PREGNANCY, URINE
Operator id: 261601
Preg Test, Ur: NEGATIVE

## 2011-09-18 LAB — LIPASE, BLOOD: Lipase: 14

## 2011-09-18 LAB — CBC
HCT: 39.6
MCHC: 32.8
MCV: 93.7
Platelets: 209
WBC: 7.6

## 2011-09-18 LAB — DIFFERENTIAL
Basophils Absolute: 0
Lymphocytes Relative: 30
Neutro Abs: 4.8

## 2011-10-02 LAB — COMPREHENSIVE METABOLIC PANEL
Alkaline Phosphatase: 75
BUN: 7
GFR calc non Af Amer: >60
Glucose, Bld: 93
Potassium: 2.7 — CL
Total Protein: 6.5

## 2011-10-02 LAB — I-STAT 8, (EC8 V) (CONVERTED LAB)
Acid-Base Excess: 2
Bicarbonate: 26.2 — ABNORMAL HIGH
HCT: 46
Operator id: 200941
pCO2, Ven: 40.6 — ABNORMAL LOW

## 2011-10-02 LAB — IRON: Iron: 64

## 2011-10-02 LAB — DIFFERENTIAL
Basophils Absolute: 0
Basophils Relative: 1
Monocytes Relative: 9
Neutro Abs: 3.2
Neutrophils Relative %: 55

## 2011-10-02 LAB — CBC
HCT: 43.5
Hemoglobin: 14.6
MCHC: 33.6
RDW: 14.9 — ABNORMAL HIGH

## 2011-10-02 LAB — VITAMIN B12: Vitamin B-12: 918 — ABNORMAL HIGH (ref 211–911)

## 2011-10-02 LAB — T4, FREE: Free T4: 0.98

## 2011-10-02 LAB — T3, FREE: T3, Free: 2.8 (ref 2.3–4.2)

## 2011-10-05 LAB — CBC
MCHC: 34.5
Platelets: 218
Platelets: 234
RBC: 4.43
RDW: 13.9
RDW: 13.9
WBC: 6.9

## 2011-10-05 LAB — DIFFERENTIAL
Basophils Absolute: 0
Basophils Absolute: 0
Basophils Relative: 0
Eosinophils Absolute: 0
Eosinophils Absolute: 0.1
Lymphocytes Relative: 32
Lymphs Abs: 2.2
Neutro Abs: 5.4
Neutrophils Relative %: 56
Neutrophils Relative %: 67

## 2011-10-05 LAB — HEMOGLOBIN AND HEMATOCRIT, BLOOD
HCT: 36.5
Hemoglobin: 12.4

## 2011-10-06 LAB — COMPREHENSIVE METABOLIC PANEL
AST: 22
Albumin: 3.9
Alkaline Phosphatase: 65
CO2: 26
Chloride: 102
Creatinine, Ser: 0.66
GFR calc Af Amer: >60
GFR calc non Af Amer: >60
Potassium: 3.9
Total Bilirubin: 0.5

## 2011-10-06 LAB — DIFFERENTIAL
Basophils Absolute: 0
Basophils Relative: 0
Eosinophils Absolute: 0.1
Eosinophils Relative: 1
Lymphocytes Relative: 30
Monocytes Absolute: 0.4

## 2011-10-06 LAB — URINALYSIS, ROUTINE W REFLEX MICROSCOPIC
Bilirubin Urine: NEGATIVE
Nitrite: NEGATIVE
Protein, ur: NEGATIVE
Urobilinogen, UA: 0.2

## 2011-10-06 LAB — HEMOGLOBIN AND HEMATOCRIT, BLOOD: HCT: 37.6

## 2011-10-06 LAB — CBC
HCT: 38.8
MCV: 85.7
Platelets: 269
RBC: 4.53
WBC: 6.8

## 2011-11-24 ENCOUNTER — Ambulatory Visit (INDEPENDENT_AMBULATORY_CARE_PROVIDER_SITE_OTHER): Payer: 59 | Admitting: Family

## 2011-11-24 ENCOUNTER — Encounter: Payer: Self-pay | Admitting: Family

## 2011-11-24 DIAGNOSIS — J209 Acute bronchitis, unspecified: Secondary | ICD-10-CM | POA: Insufficient documentation

## 2011-11-24 MED ORDER — FLUTICASONE-SALMETEROL 250-50 MCG/DOSE IN AEPB: 1.0000 | INHALATION_SPRAY | Freq: Two times a day (BID) | RESPIRATORY_TRACT | Status: DC

## 2011-11-24 MED ORDER — HYDROCOD POLST-CHLORPHEN POLST 10-8 MG/5ML PO LQCR: 5.0000 mL | Freq: Every evening | ORAL | Status: DC | PRN

## 2011-11-24 MED ORDER — AZITHROMYCIN 250 MG PO TABS: ORAL_TABLET | ORAL | Status: AC

## 2011-11-24 NOTE — Assessment & Plan Note (Signed)
Will treat with zithromax.  While pt is not overtly wheezing, I suspect a component of bronchospasm given her report.  2 week supply of advair provided to the patient today.  Tussionex HS prn- pt instructed not to take if driving.

## 2011-11-24 NOTE — Progress Notes (Signed)
Subjective:    Patient ID: Martha Hampton, female    DOB: Mar 20, 1966, 45 y.o.   MRN: [id number]  HPI  [name] is a 45 yr old female who presents today with chief complaint of cough.  She reports that symptoms started with a sore throat and runny nose.  Now has turned into a cough, worse at night before she goes to bed.  Noes that she has thick sputum.  Feels a tickle when she breaths. Has tried sudafed and zyrtec without much improvement.  She initially had a headache.  She has some soreness with swallowing.  + post nasal drip- clear nasal drainage.    Review of Systems See HPI  Past Medical History  Diagnosis Date  . Anxiety   . Hyperlipidemia   . Anemia     iron deficiency  . GERD (gastroesophageal reflux disease)     History   Social History  . Marital Status: Married    Spouse Name: N/A    Number of Children: N/A  . Years of Education: N/A   Occupational History  . Not on file.   Social History Main Topics  . Smoking status: Never Smoker   . Smokeless tobacco: Never Used  . Alcohol Use: No  . Drug Use: No  . Sexually Active: Not on file   Other Topics Concern  . Not on file   Social History Narrative  . No narrative on file    Past Surgical History  Procedure Date  . Gastric bypass 07-04-07  . Cholecystectomy 11-2005  . Ankle surgery 1986    left    Family History  Problem Relation Age of Onset  . Hypertension Mother   . Hypertension Father   . Cancer Mother     non-dodgkins mantel cell  . Rectal cancer Maternal Grandmother   . Breast cancer Maternal Aunt     Allergies  Allergen Reactions  . Anesthetics, Amide     Pt has malignant hyperthermia   . Penicillins     REACTION: RASH    Current Outpatient Prescriptions on File Prior to Visit  Medication Sig Dispense Refill  . B Complex Vitamins (B COMPLEX 1 PO) Take by mouth daily.        . Multiple Vitamin (MULTIVITAMIN PO) Take 2 tablets by mouth daily.        Marland Kitchen PARoxetine (PAXIL) 10 MG  tablet Take 1 tablet (10 mg total) by mouth daily.  90 tablet  3  . Vitamin D, Ergocalciferol, (DRISDOL) 50000 UNITS CAPS Take 50,000 Units by mouth every 7 (seven) days.        . Esomeprazole Magnesium (NEXIUM PO) Take by mouth daily.          BP 100/60  Pulse 97  Temp(Src) 98.1 F (36.7 C) (Oral)  Ht 5' 3.25" (1.607 m)  Wt 228 lb 0.6 oz (103.438 kg)  BMI 40.08 kg/m2  SpO2 99%       Objective:   Physical Exam  Constitutional: She appears well-developed and well-nourished.  HENT:  Head: Normocephalic and atraumatic.  Right Ear: Tympanic membrane and ear canal normal.  Left Ear: Tympanic membrane and ear canal normal.  Mouth/Throat: No posterior oropharyngeal edema or posterior oropharyngeal erythema.  Cardiovascular: Normal rate and regular rhythm.   No murmur heard. Pulmonary/Chest: Effort normal and breath sounds normal. No respiratory distress. She has no wheezes. She has no rales. She exhibits no tenderness.       Dry hacking cough intermittently throughout  exam.           Assessment & Plan:

## 2011-11-24 NOTE — Patient Instructions (Signed)
Please call if your symptoms worsen or if your are not feeling better in 2-3 days.  You may use Delsym as needed for cough during the day.   Bronchitis Bronchitis is the body's way of reacting to injury and/or infection (inflammation) of the bronchi. Bronchi are the air tubes that extend from the windpipe into the lungs. If the inflammation becomes severe, it may cause shortness of breath. CAUSES  Inflammation may be caused by:  A virus.   Germs (bacteria).   Dust.   Allergens.   Pollutants and many other irritants.  The cells lining the bronchial tree are covered with tiny hairs (cilia). These constantly beat upward, away from the lungs, toward the mouth. This keeps the lungs free of pollutants. When these cells become too irritated and are unable to do their job, mucus begins to develop. This causes the characteristic cough of bronchitis. The cough clears the lungs when the cilia are unable to do their job. Without either of these protective mechanisms, the mucus would settle in the lungs. Then you would develop pneumonia. Smoking is a common cause of bronchitis and can contribute to pneumonia. Stopping this habit is the single most important thing you can do to help yourself. TREATMENT   Your caregiver may prescribe an antibiotic if the cough is caused by bacteria. Also, medicines that open up your airways make it easier to breathe. Your caregiver may also recommend or prescribe an expectorant. It will loosen the mucus to be coughed up. Only take over-the-counter or prescription medicines for pain, discomfort, or fever as directed by your caregiver.   Removing whatever causes the problem (smoking, for example) is critical to preventing the problem from getting worse.   Cough suppressants may be prescribed for relief of cough symptoms.   Inhaled medicines may be prescribed to help with symptoms now and to help prevent problems from returning.   For those with recurrent (chronic)  bronchitis, there may be a need for steroid medicines.  SEEK IMMEDIATE MEDICAL CARE IF:   During treatment, you develop more pus-like mucus (purulent sputum).   You have a fever.   Your baby is older than 3 months with a rectal temperature of 102 F (38.9 C) or higher.   Your baby is 38 months old or younger with a rectal temperature of 100.4 F (38 C) or higher.   You become progressively more ill.   You have increased difficulty breathing, wheezing, or shortness of breath.  It is necessary to seek immediate medical care if you are elderly or sick from any other disease. MAKE SURE YOU:   Understand these instructions.   Will watch your condition.   Will get help right away if you are not doing well or get worse.  Document Released: 12/07/2005 Document Revised: 08/19/2011 Document Reviewed: 10/16/2008 [facility] Patient Information 2012 [place], [place].

## 2012-01-29 ENCOUNTER — Encounter: Payer: Self-pay | Admitting: Family Medicine

## 2012-01-29 ENCOUNTER — Ambulatory Visit (INDEPENDENT_AMBULATORY_CARE_PROVIDER_SITE_OTHER): Payer: 59 | Admitting: Family Medicine

## 2012-01-29 DIAGNOSIS — J329 Chronic sinusitis, unspecified: Secondary | ICD-10-CM | POA: Insufficient documentation

## 2012-01-29 DIAGNOSIS — F419 Anxiety disorder, unspecified: Secondary | ICD-10-CM

## 2012-01-29 DIAGNOSIS — F411 Generalized anxiety disorder: Secondary | ICD-10-CM

## 2012-01-29 HISTORY — DX: Chronic sinusitis, unspecified: J32.9

## 2012-01-29 MED ORDER — CLARITHROMYCIN ER 500 MG PO TB24: 1000.0000 mg | ORAL_TABLET | Freq: Every day | ORAL | Status: AC

## 2012-01-29 MED ORDER — PAROXETINE HCL 10 MG PO TABS: ORAL_TABLET | ORAL | Status: DC
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
## 2012-01-29 NOTE — Patient Instructions (Signed)
Schedule an appt w/ Dr Laury Axon in 1 month to follow up anxiety This is a sinus infection Start the Biaxin- 2 tabs at the same time daily- w/ food Drink plenty of fluids Add Mucinex to thin your congestion Delsym as needed for cough Increase Paxil to 1.5 tabs daily REST! Hang in there!
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
## 2012-01-29 NOTE — Assessment & Plan Note (Signed)
Pt's sxs and PE consistent w/ sinus infxn.  Start abx.  Reviewed supportive care and red flags that should prompt return.  Pt expressed understanding and is in agreement w/ plan.  

## 2012-02-05 ENCOUNTER — Telehealth: Payer: Self-pay

## 2012-02-05 MED ORDER — NYSTATIN 100000 UNIT/ML MT SUSP: 500000.0000 [IU] | Freq: Four times a day (QID) | OROMUCOSAL | Status: AC

## 2012-02-05 MED ORDER — DOXYCYCLINE HYCLATE 100 MG PO TABS: 100.0000 mg | ORAL_TABLET | Freq: Two times a day (BID) | ORAL | Status: AC

## 2012-02-05 NOTE — Telephone Encounter (Signed)
Patient made aware Rx sent to the pharmacy     KP 

## 2012-02-05 NOTE — Telephone Encounter (Signed)
Patient was seen 01/29/12 and given Biaxin she stated she took it for 2 days and developed thrush in her mouth so she stopped taking the Abx thinking the thrush would get better, she stated it got a little better but it is still all over her tongue and her sinus symptoms have gotten worst. She wanted to know if we could change Abx and call in something for the thrush. Please advise   KP

## 2012-02-05 NOTE — Telephone Encounter (Signed)
Can switch to Doxy 100mg  bid x10 days (w/ food) For the thrush will need nystatin 100,000 units/ml, 5 ml swish and spit/swallow 4x/day until sxs improve

## 2012-03-04 ENCOUNTER — Encounter: Payer: 59 | Admitting: Family Medicine

## 2012-03-04 DIAGNOSIS — Z0289 Encounter for other administrative examinations: Secondary | ICD-10-CM

## 2012-04-20 ENCOUNTER — Encounter: Payer: Self-pay | Admitting: Family Medicine

## 2012-04-20 ENCOUNTER — Ambulatory Visit (INDEPENDENT_AMBULATORY_CARE_PROVIDER_SITE_OTHER): Payer: 59 | Admitting: Family Medicine

## 2012-04-20 VITALS — BP 127/80 | HR 89 | Temp 98.2°F | Ht 63.5 in | Wt 242.2 lb

## 2012-04-20 DIAGNOSIS — M2550 Pain in unspecified joint: Secondary | ICD-10-CM

## 2012-04-20 HISTORY — DX: Pain in unspecified joint: M25.50

## 2012-04-20 LAB — SEDIMENTATION RATE: Sed Rate: 17 mm/hr (ref 0–22)

## 2012-04-20 NOTE — Progress Notes (Signed)
  Subjective:    Patient ID: Martha Hampton, female    DOB: [birth year], 46 y.o.   MRN: [id number]  HPI Pain- pt reports + family hx of RA.  Has been having joint pains for 'years'.  Has had injxns in knees, spine.  'it feels like i'm splitting in half'.  Hips are particularly bad.  Getting up is the worst.  Has always attributed pain to weight.  Pain is worst in the AM.  Bilateral sxs.  Fingers, wrists, elbows, shoulders.  Unable to lie on her side at night due to pain.   Review of Systems For ROS see HPI     Objective:   Physical Exam  Vitals reviewed. Constitutional: She appears well-developed and well-nourished. No distress.  Musculoskeletal:       Some enlargement of PIP joints w/ ulnar deviation          Assessment & Plan:

## 2012-04-20 NOTE — Patient Instructions (Signed)
We'll notify you of your lab results Someone will call you with your Rheumatology appt Call with any questions or concerns Hang in there!!!

## 2012-04-20 NOTE — Assessment & Plan Note (Signed)
New.  Pt's sxs are diffuse and symmetric, worse in AM.  This leans to RA as dx.  Fact that she is complaining most about large joints points more towards OA.  Check labs and refer to rheum.  Pt expressed understanding and is in agreement w/ plan.

## 2012-04-21 ENCOUNTER — Encounter: Payer: Self-pay | Admitting: *Deleted

## 2012-04-21 LAB — ANA: Anti Nuclear Antibody(ANA): NEGATIVE

## 2012-04-21 LAB — RHEUMATOID FACTOR: Rhuematoid fact SerPl-aCnc: <10 IU/mL (ref <=14)

## 2012-09-26 ENCOUNTER — Encounter: Payer: Self-pay | Admitting: Family Medicine

## 2012-09-26 ENCOUNTER — Ambulatory Visit (INDEPENDENT_AMBULATORY_CARE_PROVIDER_SITE_OTHER): Payer: 59 | Admitting: Family Medicine

## 2012-09-26 ENCOUNTER — Ambulatory Visit
Admission: RE | Admit: 2012-09-26 | Discharge: 2012-09-26 | Disposition: A | Discharge status: Home / Self Care | Payer: 59 | Source: Ambulatory Visit | Attending: Family Medicine | Admitting: Family Medicine

## 2012-09-26 VITALS — BP 116/80 | HR 79 | Temp 98.3°F | Wt 236.8 lb

## 2012-09-26 DIAGNOSIS — R42 Dizziness and giddiness: Secondary | ICD-10-CM

## 2012-09-26 DIAGNOSIS — R51 Headache: Secondary | ICD-10-CM

## 2012-09-26 LAB — CBC WITH DIFFERENTIAL/PLATELET
Basophils Absolute: 0 10*3/uL (ref 0.0–0.1)
Eosinophils Relative: 1.6 % (ref 0.0–5.0)
MCV: 84.1 fl (ref 78.0–100.0)
Monocytes Absolute: 0.5 10*3/uL (ref 0.1–1.0)
Neutrophils Relative %: 54.5 % (ref 43.0–77.0)
Platelets: 267 10*3/uL (ref 150.0–400.0)
RDW: 15.2 % — ABNORMAL HIGH (ref 11.5–14.6)
WBC: 5.6 10*3/uL (ref 4.5–10.5)

## 2012-09-26 LAB — BASIC METABOLIC PANEL
BUN: 9 mg/dL (ref 6–23)
Chloride: 104 mEq/L (ref 96–112)
Creatinine, Ser: 0.8 mg/dL (ref 0.4–1.2)
GFR: 82.07 mL/min (ref >60.00)

## 2012-09-26 LAB — HEPATIC FUNCTION PANEL
ALT: 17 U/L (ref 0–35)
Bilirubin, Direct: 0 mg/dL (ref 0.0–0.3)
Total Bilirubin: 0.2 mg/dL — ABNORMAL LOW (ref 0.3–1.2)

## 2012-09-26 MED ORDER — TRAMADOL HCL 50 MG PO TABS: 50.0000 mg | ORAL_TABLET | Freq: Three times a day (TID) | ORAL | Status: DC | PRN

## 2012-09-26 MED ORDER — CYCLOBENZAPRINE HCL 10 MG PO TABS: 10.0000 mg | ORAL_TABLET | Freq: Three times a day (TID) | ORAL | Status: DC | PRN

## 2012-09-26 NOTE — Progress Notes (Signed)
  Subjective:    Martha Hampton is a 46 y.o. female who presents for evaluation of headache. Symptoms began about 1 week ago. Generally, the headaches last about all day and occur continuously. The headaches do not seem to be related to any time of the day. The headaches are usually dull and are located in back of head and go up and around to L frontal region..  The patient rates her most severe headaches a 8 on a scale from 1 to 10. Recently, the headaches have been increasing in both severity and frequency. Work attendance or other daily activities are affected by the headaches. Precipitating factors include: none which have been determined. The headaches are usually not preceded by an aura. Associated neurologic symptoms: dizziness. The patient denies muscle weakness, numbness of extremities, speech difficulties, vision problems, vomiting in the early morning and worsening school/work performance. Home treatment has included acetaminophen and ibuprofen, darkening the room and resting with little improvement. Other history includes: nothing pertinent. Family history includes no known family members with significant headaches. Pt states she gets very dizzy with changes in position. The following portions of the patient's history were reviewed and updated as appropriate: allergies, current medications, past family history, past medical history, past social history, past surgical history and problem list.  Review of Systems Pertinent items are noted in HPI.    Objective:    BP 116/80  Pulse 79  Temp 98.3 F (36.8 C) (Oral)  Wt 236 lb 12.8 oz (107.412 kg)  SpO2 98% General appearance: alert, cooperative, appears stated age and no distress Eyes: conjunctivae/corneas clear. PERRL, EOM's intact. Fundi benign. Ears: normal TM's and external ear canals both ears Nose: Nares normal. Septum midline. Mucosa normal. No drainage or sinus tenderness. Neck: no adenopathy, no carotid bruit, no JVD, supple,  symmetrical, trachea midline and thyroid not enlarged, symmetric, no tenderness/mass/nodules Neurologic: Alert and oriented X 3, normal strength and tone. Normal symmetric reflexes. Normal coordination and gait --no dizziness now-- she took dramamine.   Assessment:    headache and dizziness    Plan:    Lie in darkened room and apply cold packs as needed for pain. Episodic therapy: ultram and flexeril due to low frequency of pain. Side effect profile discussed in detail. Asked to keep headache diary. Patient reassured that neurodiagnostic workup not indicated from benign H&P. Neurodiagnostic workup: MRI to r/o bleed, mass.

## 2012-09-26 NOTE — Patient Instructions (Addendum)

## 2012-09-27 ENCOUNTER — Telehealth: Payer: Self-pay

## 2012-09-27 MED ORDER — CLARITHROMYCIN ER 500 MG PO TB24: 1000.0000 mg | ORAL_TABLET | Freq: Every day | ORAL | Status: DC

## 2012-09-27 NOTE — Telephone Encounter (Signed)
Does pt have copy of films?  Otherwise there is nothing to compare it too.

## 2012-09-27 NOTE — Telephone Encounter (Signed)
Discussed with patient and she voiced understanding, she wanted the radiologist to do a comparison on the pineal glans because she wants to know if anything has changes and whether or not she should wait 6 mos or not. Please advise      KP

## 2012-09-27 NOTE — Telephone Encounter (Signed)
Message copied by Arnette Norris on Tue Sep 27, 2012  8:16 AM ------      Message from: Lelon Perla      Created: Mon Sep 26, 2012  5:50 PM       ? Abnormality pineal gland---- repeat MRI 6 months      ?sinus inflammation----- biaxin xl  2 po qd for 14 days

## 2012-09-27 NOTE — Telephone Encounter (Signed)
Called GI and they will have the radiologist who read it compare the previous MRI's.     KP

## 2012-09-27 NOTE — Telephone Encounter (Signed)
Radiologist did not mention comparing to one done in 2000---  Please call radiology an ask to have radiologist compare pineal gland with mri done in 2000.

## 2012-09-27 NOTE — Telephone Encounter (Signed)
Call from imaging and was made aware I would need to call [facility] at [phone]. Jane from [facility] made me aware that their were no films to compare too so this is why it could not be done, the only thing available are the written reports.      KP

## 2012-09-27 NOTE — Telephone Encounter (Signed)
Discussed with patient and she voice understanding, she will try to call [place] and see if they may have old films for a comparison.     KP

## 2012-12-02 ENCOUNTER — Other Ambulatory Visit: Payer: Self-pay | Admitting: Gastroenterology

## 2012-12-02 ENCOUNTER — Ambulatory Visit: Payer: 59 | Admitting: Family Medicine

## 2013-01-06 ENCOUNTER — Encounter (HOSPITAL_COMMUNITY)
Admission: RE | Disposition: A | Discharge status: Home / Self Care | Payer: Self-pay | Source: Ambulatory Visit | Attending: Gastroenterology

## 2013-01-06 ENCOUNTER — Ambulatory Visit (HOSPITAL_COMMUNITY)
Admission: RE | Admit: 2013-01-06 | Discharge: 2013-01-06 | Disposition: A | Discharge status: Home / Self Care | Payer: 59 | Source: Ambulatory Visit | Attending: Gastroenterology | Admitting: Gastroenterology

## 2013-01-06 ENCOUNTER — Encounter (HOSPITAL_COMMUNITY): Payer: Self-pay | Admitting: Anesthesiology

## 2013-01-06 ENCOUNTER — Ambulatory Visit (HOSPITAL_COMMUNITY): Payer: 59 | Admitting: Anesthesiology

## 2013-01-06 DIAGNOSIS — K644 Residual hemorrhoidal skin tags: Secondary | ICD-10-CM | POA: Insufficient documentation

## 2013-01-06 DIAGNOSIS — Z800 Family history of malignant neoplasm of digestive organs: Secondary | ICD-10-CM | POA: Insufficient documentation

## 2013-01-06 DIAGNOSIS — E785 Hyperlipidemia, unspecified: Secondary | ICD-10-CM | POA: Insufficient documentation

## 2013-01-06 DIAGNOSIS — K6289 Other specified diseases of anus and rectum: Secondary | ICD-10-CM | POA: Insufficient documentation

## 2013-01-06 DIAGNOSIS — K5730 Diverticulosis of large intestine without perforation or abscess without bleeding: Secondary | ICD-10-CM | POA: Insufficient documentation

## 2013-01-06 DIAGNOSIS — D128 Benign neoplasm of rectum: Secondary | ICD-10-CM | POA: Insufficient documentation

## 2013-01-06 DIAGNOSIS — Z9884 Bariatric surgery status: Secondary | ICD-10-CM | POA: Insufficient documentation

## 2013-01-06 DIAGNOSIS — K219 Gastro-esophageal reflux disease without esophagitis: Secondary | ICD-10-CM | POA: Insufficient documentation

## 2013-01-06 DIAGNOSIS — K648 Other hemorrhoids: Secondary | ICD-10-CM | POA: Insufficient documentation

## 2013-01-06 HISTORY — PX: COLONOSCOPY: SHX5424

## 2013-01-06 SURGERY — COLONOSCOPY
Anesthesia: Monitor Anesthesia Care

## 2013-01-06 SURGERY — COLONOSCOPY
Anesthesia: Moderate Sedation

## 2013-01-06 MED ORDER — OXYCODONE HCL 5 MG PO TABS: 5.0000 mg | ORAL_TABLET | Freq: Once | ORAL | Status: DC | PRN

## 2013-01-06 MED ORDER — ACETAMINOPHEN 10 MG/ML IV SOLN
1000.0000 mg | Freq: Once | INTRAVENOUS | Status: DC | PRN
  Filled 2013-01-06: qty 100

## 2013-01-06 MED ORDER — FENTANYL CITRATE 0.05 MG/ML IJ SOLN
INTRAMUSCULAR | Status: DC | PRN
  Administered 2013-01-06: 100 ug via INTRAVENOUS

## 2013-01-06 MED ORDER — OXYCODONE HCL 5 MG/5ML PO SOLN: 5.0000 mg | Freq: Once | ORAL | Status: DC | PRN

## 2013-01-06 MED ORDER — LACTATED RINGERS IV SOLN
INTRAVENOUS | Status: DC
  Administered 2013-01-06: 1000 mL via INTRAVENOUS

## 2013-01-06 MED ORDER — SODIUM CHLORIDE 0.9 % IV SOLN: INTRAVENOUS | Status: DC

## 2013-01-06 MED ORDER — PROMETHAZINE HCL 25 MG/ML IJ SOLN: 6.2500 mg | INTRAMUSCULAR | Status: DC | PRN

## 2013-01-06 MED ORDER — MIDAZOLAM HCL 5 MG/5ML IJ SOLN
INTRAMUSCULAR | Status: DC | PRN
  Administered 2013-01-06: 2 mg via INTRAVENOUS

## 2013-01-06 MED ORDER — PROPOFOL INFUSION 10 MG/ML OPTIME
INTRAVENOUS | Status: DC | PRN
  Administered 2013-01-06: 300 ug/kg/min via INTRAVENOUS

## 2013-01-06 NOTE — Anesthesia Preprocedure Evaluation (Addendum)
Anesthesia Evaluation  Patient identified by MRN, date of birth, ID band Patient awake    Reviewed: Allergy & Precautions, H&P , NPO status , Patient's Chart, lab work & pertinent test results  History of Anesthesia Complications (+) MALIGNANT HYPERTHERMIA  Airway Mallampati: I TM Distance: >3 FB Neck ROM: Full    Dental  (+) Dental Advisory Given and Teeth Intact   Pulmonary neg pulmonary ROS,  breath sounds clear to auscultation  Pulmonary exam normal       Cardiovascular - Past MI and - CHF Rhythm:Regular Rate:Normal     Neuro/Psych PSYCHIATRIC DISORDERS Anxiety  Neuromuscular disease    GI/Hepatic Neg liver ROS, GERD-  Medicated,  Endo/Other  Morbid obesity  Renal/GU negative Renal ROS     Musculoskeletal   Abdominal   Peds  Hematology  (+) Blood dyscrasia, anemia ,   Anesthesia Other Findings   Reproductive/Obstetrics                        Anesthesia Physical Anesthesia Plan  ASA: III  Anesthesia Plan: MAC   Post-op Pain Management:    Induction: Intravenous  Airway Management Planned: Simple Face Mask  Additional Equipment:   Intra-op Plan:   Post-operative Plan:   Informed Consent: I have reviewed the patients History and Physical, chart, labs and discussed the procedure including the risks, benefits and alternatives for the proposed anesthesia with the patient or authorized representative who has indicated his/her understanding and acceptance.   Dental advisory given  Plan Discussed with: CRNA  Anesthesia Plan Comments:        Anesthesia Quick Evaluation

## 2013-01-06 NOTE — Op Note (Addendum)
[facility] ,   OPERATIVE PROCEDURE REPORT  PATIENT: Martha Hampton, Martha Hampton  MR#: [id number] BIRTHDATE: 1966-10-30  GENDER: Female ENDOSCOPIST: Jeani Hawking, MD ASSISTANT:   Cathlean Marseilles, RN, CGRN Oletha Blend, technician PROCEDURE DATE: 01/06/2013 PROCEDURE:   Colonoscopy with snare polypectomy ASA CLASS:   Class III INDICATIONS:FH of colon cancer. MEDICATIONS: See Anesthesia Report.  DESCRIPTION OF PROCEDURE:   After the risks benefits and alternatives of the procedure were thoroughly explained, informed consent was obtained.  A digital rectal exam revealed no abnormalities of the rectum.    The     endoscope was introduced through the anus  and advanced to the cecum, which was identified by both the appendix and ileocecal valve , No adverse events experienced.    The quality of the prep was good. .  The instrument was then slowly withdrawn as the colon was fully examined.     COLON FINDINGS: A 3 mm sessile rectosigmoid colon polyp was removed with a cold snare.  A few scattered left sided diverticula were identified.  No other abnormalities noted.   Retroflexed views revealed internal/external hemorrhoids.     The scope was then withdrawn from the patient and the procedure terminated.  COMPLICATIONS: There were no complications.  IMPRESSION: 1) Polyp. 2) Diverticula. 3) Hemorrhoids  RECOMMENDATIONS: 1) Repeat colonoscopy in 5 years. 2) Follow up with Dr. Loreta Ave as needed.   _______________________________ eSignedJeani Hawking, MD 01/06/2013 9:49 AM

## 2013-01-06 NOTE — Anesthesia Postprocedure Evaluation (Signed)
Anesthesia Post Note  Patient: Martha Hampton  Procedure(s) Performed: Procedure(s) (LRB): COLONOSCOPY (N/A)  Anesthesia type: MAC  Patient location: PACU  Post pain: Pain level controlled  Post assessment: Post-op Vital signs reviewed  Last Vitals: BP 101/62  Pulse 68  Temp 36.6 C  Resp 18  SpO2 100%  Post vital signs: Reviewed  Level of consciousness: awake  Complications: No apparent anesthesia complications

## 2013-01-06 NOTE — H&P (Signed)
Reason for Consult: Rectal pain Referring Physician: Lurline Hampton, M.D.  Martha Hampton HPI:[name] is a 47 year old female with complaints of proctalgia.  Initially it was not certain if the pain was secondary to hemorrhoidal issues versus and abscess.  Ultimately it was positive for an abscess that responded to the sue of antibiotics.  She is much better at this time.  Her grandmother had a history of rectal cancer and died in her 59's.  Past Medical History  Diagnosis Date  . Anxiety   . Hyperlipidemia   . Anemia     iron deficiency  . GERD (gastroesophageal reflux disease)     Past Surgical History  Procedure Date  . Gastric bypass 07-04-07  . Cholecystectomy 11-2005  . Ankle surgery 1986    left    Family History  Problem Relation Age of Onset  . Hypertension Mother   . Hypertension Father   . Cancer Mother     non-dodgkins mantel cell  . Rectal cancer Maternal Grandmother   . Breast cancer Maternal Aunt     Social History:  reports that she has never smoked. She has never used smokeless tobacco. She reports that she does not drink alcohol or use illicit drugs.  Allergies:  Allergies  Allergen Reactions  . Anesthetics, Amide     Pt has malignant hyperthermia   . Penicillins     REACTION: RASH    Medications:  Scheduled:  Continuous:   . sodium chloride    . lactated ringers 1,000 mL (01/06/13 0840)    No results found for this or any previous visit (from the past 24 hour(s)).   No results found.  ROS:  As stated above in the HPI otherwise negative.  There were no vitals taken for this visit.    PE: Gen: NAD, Alert and Oriented HEENT:  [place]/AT, EOMI Neck: Supple, no LAD Lungs: CTA Bilaterally CV: RRR without M/G/R ABM: Soft, NTND, +BS Ext: No C/C/E  Assessment/Plan: 1) Rectal abscess - resolved. 2) FH of rectal cancer.  Plan: 1) Colonoscopy.  [name] D 01/06/2013, 9:12 AM

## 2013-01-06 NOTE — Preoperative (Signed)
Beta Blockers   Reason not to administer Beta Blockers:Not Applicable 

## 2013-01-06 NOTE — Transfer of Care (Signed)
Immediate Anesthesia Transfer of Care Note  Patient: Martha Hampton  Procedure(s) Performed: Procedure(s) (LRB) with comments: COLONOSCOPY (N/A)  Patient Location: PACU  Anesthesia Type:MAC  Level of Consciousness: awake, alert , oriented and patient cooperative  Airway & Oxygen Therapy: Patient Spontanous Breathing and Patient connected to face mask oxygen  Post-op Assessment: Report given to PACU RN and Post -op Vital signs reviewed and stable  Post vital signs: Reviewed and stable  Complications: No apparent anesthesia complications

## 2013-01-13 ENCOUNTER — Encounter (HOSPITAL_COMMUNITY): Payer: Self-pay | Admitting: Gastroenterology

## 2013-02-06 ENCOUNTER — Telehealth: Payer: Self-pay | Admitting: Family Medicine

## 2013-02-06 DIAGNOSIS — F419 Anxiety disorder, unspecified: Secondary | ICD-10-CM

## 2013-02-06 NOTE — Telephone Encounter (Signed)
Refill: Paroxetine hcl 10 mg tab # 135. Take 1 and 1/2 tablets by mouth daily. Last fill 12-15-12

## 2013-02-07 MED ORDER — PAROXETINE HCL 10 MG PO TABS: ORAL_TABLET | ORAL | Status: DC

## 2013-02-07 NOTE — Telephone Encounter (Signed)
Refill x1 

## 2013-02-07 NOTE — Telephone Encounter (Signed)
Please advise on RF request.  Last OV:09-26-12.//AB/CMA

## 2013-05-26 ENCOUNTER — Other Ambulatory Visit: Payer: Self-pay | Admitting: Family Medicine

## 2013-05-26 DIAGNOSIS — M545 Low back pain: Secondary | ICD-10-CM

## 2013-06-12 ENCOUNTER — Ambulatory Visit
Admission: RE | Admit: 2013-06-12 | Discharge: 2013-06-12 | Disposition: A | Discharge status: Home / Self Care | Payer: 59 | Source: Ambulatory Visit | Attending: Family Medicine | Admitting: Family Medicine

## 2013-06-12 DIAGNOSIS — M545 Low back pain: Secondary | ICD-10-CM

## 2013-08-10 ENCOUNTER — Ambulatory Visit (INDEPENDENT_AMBULATORY_CARE_PROVIDER_SITE_OTHER): Payer: 59 | Admitting: Family Medicine

## 2013-08-10 ENCOUNTER — Encounter: Payer: Self-pay | Admitting: Family Medicine

## 2013-08-10 ENCOUNTER — Other Ambulatory Visit: Payer: Self-pay | Admitting: *Deleted

## 2013-08-10 ENCOUNTER — Other Ambulatory Visit: Payer: Self-pay

## 2013-08-10 VITALS — BP 124/84 | HR 80 | Temp 98.7°F | Wt 240.0 lb

## 2013-08-10 DIAGNOSIS — F419 Anxiety disorder, unspecified: Secondary | ICD-10-CM

## 2013-08-10 DIAGNOSIS — R5381 Other malaise: Secondary | ICD-10-CM

## 2013-08-10 DIAGNOSIS — F988 Other specified behavioral and emotional disorders with onset usually occurring in childhood and adolescence: Secondary | ICD-10-CM

## 2013-08-10 DIAGNOSIS — L299 Pruritus, unspecified: Secondary | ICD-10-CM

## 2013-08-10 DIAGNOSIS — F411 Generalized anxiety disorder: Secondary | ICD-10-CM

## 2013-08-10 DIAGNOSIS — R51 Headache: Secondary | ICD-10-CM

## 2013-08-10 MED ORDER — PAROXETINE HCL 10 MG PO TABS: ORAL_TABLET | ORAL | Status: DC

## 2013-08-10 MED ORDER — AMPHETAMINE-DEXTROAMPHET ER 10 MG PO CP24: 10.0000 mg | ORAL_CAPSULE | ORAL | Status: DC

## 2013-08-10 NOTE — Patient Instructions (Signed)
Attention Deficit Hyperactivity Disorder Attention deficit hyperactivity disorder (ADHD) is a problem with behavior issues based on the way the brain functions (neurobehavioral disorder). It is a common reason for behavior and academic problems in school. CAUSES  The cause of ADHD is unknown in most cases. It may run in families. It sometimes can be associated with learning disabilities and other behavioral problems. SYMPTOMS  There are 3 types of ADHD. The 3 types and some of the symptoms include:  Inattentive  Gets bored or distracted easily.  Loses or forgets things. Forgets to hand in homework.  Has trouble organizing or completing tasks.  Difficulty staying on task.  An inability to organize daily tasks and school work.  Leaving projects, chores, or homework unfinished.  Trouble paying attention or responding to details. Careless mistakes.  Difficulty following directions. Often seems like is not listening.  Dislikes activities that require sustained attention (like chores or homework).  Hyperactive-impulsive  Feels like it is impossible to sit still or stay in a seat. Fidgeting with hands and feet.  Trouble waiting turn.  Talking too much or out of turn. Interruptive.  Speaks or acts impulsively.  Aggressive, disruptive behavior.  Constantly busy or on the go, noisy.  Combined  Has symptoms of both of the above. Often children with ADHD feel discouraged about themselves and with school. They often perform well below their abilities in school. These symptoms can cause problems in home, school, and in relationships with peers. As children get older, the excess motor activities can calm down, but the problems with paying attention and staying organized persist. Most children do not outgrow ADHD but with good treatment can learn to cope with the symptoms. DIAGNOSIS  When ADHD is suspected, the diagnosis should be made by professionals trained in ADHD.  Diagnosis will  include:  Ruling out other reasons for the child's behavior.  The caregivers will check with the child's school and check their medical records.  They will talk to teachers and parents.  Behavior rating scales for the child will be filled out by those dealing with the child on a daily basis. A diagnosis is made only after all information has been considered. TREATMENT  Treatment usually includes behavioral treatment often along with medicines. It may include stimulant medicines. The stimulant medicines decrease impulsivity and hyperactivity and increase attention. Other medicines used include antidepressants and certain blood pressure medicines. Most experts agree that treatment for ADHD should address all aspects of the child's functioning. Treatment should not be limited to the use of medicines alone. Treatment should include structured classroom management. The parents must receive education to address rewarding good behavior, discipline, and limit-setting. Tutoring or behavioral therapy or both should be available for the child. If untreated, the disorder can have long-term serious effects into adolescence and adulthood. HOME CARE INSTRUCTIONS   Often with ADHD there is a lot of frustration among the family in dealing with the illness. There is often blame and anger that is not warranted. This is a life long illness. There is no way to prevent ADHD. In many cases, because the problem affects the family as a whole, the entire family may need help. A therapist can help the family find better ways to handle the disruptive behaviors and promote change. If the child is young, most of the therapist's work is with the parents. Parents will learn techniques for coping with and improving their child's behavior. Sometimes only the child with the ADHD needs counseling. Your caregivers can help   you make these decisions.  Children with ADHD may need help in organizing. Some helpful tips include:  Keep  routines the same every day from wake-up time to bedtime. Schedule everything. This includes homework and playtime. This should include outdoor and indoor recreation. Keep the schedule on the refrigerator or a bulletin board where it is frequently seen. Mark schedule changes as far in advance as possible.  Have a place for everything and keep everything in its place. This includes clothing, backpacks, and school supplies.  Encourage writing down assignments and bringing home needed books.  Offer your child a well-balanced diet. Breakfast is especially important for school performance. Children should avoid drinks with caffeine including:  Soft drinks.  Coffee.  Tea.  However, some older children (adolescents) may find these drinks helpful in improving their attention.  Children with ADHD need consistent rules that they can understand and follow. If rules are followed, give small rewards. Children with ADHD often receive, and expect, criticism. Look for good behavior and praise it. Set realistic goals. Give clear instructions. Look for activities that can foster success and self-esteem. Make time for pleasant activities with your child. Give lots of affection.  Parents are their children's greatest advocates. Learn as much as possible about ADHD. This helps you become a stronger and better advocate for your child. It also helps you educate your child's teachers and instructors if they feel inadequate in these areas. Parent support groups are often helpful. A national group with local chapters is called CHADD (Children and Adults with Attention Deficit Hyperactivity Disorder). PROGNOSIS  There is no cure for ADHD. Children with the disorder seldom outgrow it. Many find adaptive ways to accommodate the ADHD as they mature. SEEK MEDICAL CARE IF:  Your child has repeated muscle twitches, cough or speech outbursts.  Your child has sleep problems.  Your child has a marked loss of  appetite.  Your child develops depression.  Your child has new or worsening behavioral problems.  Your child develops dizziness.  Your child has a racing heart.  Your child has stomach pains.  Your child develops headaches. Document Released: 11/27/2002 Document Revised: 02/29/2012 Document Reviewed: 07/09/2008 Exit[employer] Patient Information 2014 [employer].  

## 2013-08-10 NOTE — Telephone Encounter (Signed)
Pharmacy is requesting a refill for paroxetine 10mg .  However the patient has not had a office visit since 09/26/12.  Patient needs an office visit before medication can be refilled.   Ag cma

## 2013-08-10 NOTE — Telephone Encounter (Signed)
Last seen 09/26/12 and filled 02/07/13 #135, apt has been scheduled for 08/10/13.      KP

## 2013-08-10 NOTE — Telephone Encounter (Signed)
Patient has been scheduled to see Dr. Laury Axon today at 3:45 pm.  Ag cma

## 2013-08-11 LAB — VITAMIN B12: Vitamin B-12: >1500 pg/mL — ABNORMAL HIGH (ref 211–911)

## 2013-08-11 LAB — HEPATIC FUNCTION PANEL
AST: 17 U/L (ref 0–37)
Total Bilirubin: 0.4 mg/dL (ref 0.3–1.2)

## 2013-08-12 DIAGNOSIS — E669 Obesity, unspecified: Secondary | ICD-10-CM | POA: Insufficient documentation

## 2013-08-12 DIAGNOSIS — F988 Other specified behavioral and emotional disorders with onset usually occurring in childhood and adolescence: Secondary | ICD-10-CM

## 2013-08-12 HISTORY — DX: Obesity, unspecified: E66.9

## 2013-08-12 HISTORY — DX: Other specified behavioral and emotional disorders with onset usually occurring in childhood and adolescence: F98.8

## 2013-08-12 NOTE — Assessment & Plan Note (Signed)
Refill meds No new symptoms

## 2013-08-12 NOTE — Progress Notes (Signed)
  Subjective:    Patient ID: Martha Hampton, female    DOB: 29-Jan-1966, 47 y.o.   MRN: [id number]  HPI Pt here to f/u anxiety.  She also c/o difficculty concentrating and getting tasks done. One of her kids has ADD and she thinks she has it too.    Review of Systems As above     Objective:   Physical Exam  BP 124/84  Pulse 80  Temp(Src) 98.7 F (37.1 C) (Other (Comment))  Wt 240 lb (108.863 kg)  BMI 41.84 kg/m2  SpO2 98% General appearance: alert, cooperative, appears stated age and no distress Lungs: clear to auscultation bilaterally Heart: S1, S2 normal Extremities: extremities normal, atraumatic, no cyanosis or edema Neurologic: Alert and oriented X 3, normal strength and tone. Normal symmetric reflexes. Normal coordination and gait      Assessment & Plan:

## 2013-08-12 NOTE — Assessment & Plan Note (Signed)
Try adderall rto 1 month 

## 2013-08-14 ENCOUNTER — Telehealth: Payer: Self-pay

## 2013-08-14 ENCOUNTER — Telehealth: Payer: Self-pay | Admitting: Family Medicine

## 2013-08-14 NOTE — Telephone Encounter (Signed)
[id number]  Sep 28 2013 exp   They ok'd it.  ---dr Laury Axon

## 2013-08-14 NOTE — Telephone Encounter (Signed)
Zenon Mayo will call and add the Uric acid   KP

## 2013-08-14 NOTE — Telephone Encounter (Signed)
Message copied by Arnette Norris on Mon Aug 14, 2013 11:52 AM ------      Message from: Darral Dash E      Created: Mon Aug 14, 2013 11:32 AM      Regarding: Peer to Peer         Precert for MRI   Needs peer to peer   [phone] option 3 case # [id number]    Need pt name and date of birth ------

## 2013-08-14 NOTE — Telephone Encounter (Signed)
Can a uric acid be added?

## 2013-08-14 NOTE — Telephone Encounter (Signed)
To MD for review     KP 

## 2013-08-14 NOTE — Telephone Encounter (Signed)
08/14/2013  Martha Hampton called to ask about the blood tests she had drawn on Thursday, wondering if they tested for gout or if she may need to come back in to have that done.  Her right ankle has been bothering her for aprox 2 1/2 weeks.  She did not address with Dr. Laury Axon during her appt 08/10/2013.  Please advise.

## 2013-08-14 NOTE — Telephone Encounter (Signed)
See note with approval number-- it was okd

## 2013-08-14 NOTE — Telephone Encounter (Signed)
Patient is concerned about a possibility of Gout in her foot and would like to be tested. Please advise      KP

## 2013-08-15 ENCOUNTER — Ambulatory Visit: Payer: 59

## 2013-08-15 DIAGNOSIS — M109 Gout, unspecified: Secondary | ICD-10-CM

## 2013-08-15 LAB — URIC ACID: Uric Acid, Serum: 4 mg/dL (ref 2.4–7.0)

## 2013-08-18 ENCOUNTER — Other Ambulatory Visit: Payer: 59

## 2013-08-25 ENCOUNTER — Ambulatory Visit
Admission: RE | Admit: 2013-08-25 | Discharge: 2013-08-25 | Disposition: A | Discharge status: Home / Self Care | Payer: 59 | Source: Ambulatory Visit | Attending: Family Medicine | Admitting: Family Medicine

## 2013-08-25 DIAGNOSIS — R51 Headache: Secondary | ICD-10-CM

## 2013-08-25 MED ORDER — GADOBENATE DIMEGLUMINE 529 MG/ML IV SOLN
20.0000 mL | Freq: Once | INTRAVENOUS | Status: AC | PRN
  Administered 2013-08-25: 20 mL via INTRAVENOUS

## 2013-08-28 ENCOUNTER — Encounter: Payer: Self-pay | Admitting: General Practice

## 2013-08-30 ENCOUNTER — Telehealth: Payer: Self-pay | Admitting: Family Medicine

## 2013-08-30 NOTE — Telephone Encounter (Signed)
Notes Recorded by Lelon Perla, DO on 08/26/2013 at 7:44 PM Unchanged ---recheck 2 years

## 2013-08-30 NOTE — Telephone Encounter (Signed)
Patient aware of results.       KP

## 2013-08-30 NOTE — Telephone Encounter (Signed)
Patient is calling to request 08/25/13 MRI results. Please advise.

## 2013-09-15 ENCOUNTER — Other Ambulatory Visit: Payer: Self-pay | Admitting: Orthopaedic Surgery

## 2013-09-15 DIAGNOSIS — M79671 Pain in right foot: Secondary | ICD-10-CM

## 2013-09-22 ENCOUNTER — Ambulatory Visit
Admission: RE | Admit: 2013-09-22 | Discharge: 2013-09-22 | Disposition: A | Discharge status: Home / Self Care | Payer: 59 | Source: Ambulatory Visit | Attending: Orthopaedic Surgery | Admitting: Orthopaedic Surgery

## 2013-09-22 DIAGNOSIS — M79671 Pain in right foot: Secondary | ICD-10-CM

## 2013-10-02 ENCOUNTER — Other Ambulatory Visit: Payer: Self-pay | Admitting: *Deleted

## 2013-10-02 DIAGNOSIS — F988 Other specified behavioral and emotional disorders with onset usually occurring in childhood and adolescence: Secondary | ICD-10-CM

## 2013-10-02 NOTE — Telephone Encounter (Signed)
Patient would like refill for Aderall ER. Pt states that she would like to know if there is any way that she can get around from having to come in every month to pick up the prescription.  Last visit:08/10/2013  Last filled: 08/10/2013, #30 0 refills   No UDS or contract on file  Please advise. SW

## 2013-10-02 NOTE — Telephone Encounter (Signed)
We can give 3 separate rxs

## 2013-10-03 ENCOUNTER — Telehealth: Payer: Self-pay | Admitting: Family Medicine

## 2013-10-03 NOTE — Telephone Encounter (Signed)
Patient called about seeing was her adderall medication ready to pick up.

## 2013-10-04 ENCOUNTER — Other Ambulatory Visit: Payer: Self-pay | Admitting: *Deleted

## 2013-10-04 DIAGNOSIS — F988 Other specified behavioral and emotional disorders with onset usually occurring in childhood and adolescence: Secondary | ICD-10-CM

## 2013-10-04 MED ORDER — AMPHETAMINE-DEXTROAMPHET ER 10 MG PO CP24: 10.0000 mg | ORAL_CAPSULE | ORAL | Status: DC

## 2013-10-04 NOTE — Telephone Encounter (Signed)
Medication filled and patient has already pick up Rx's

## 2013-10-04 NOTE — Telephone Encounter (Signed)
Pt was given 3 different Rx for Adderall with the date to fill.

## 2014-01-26 ENCOUNTER — Emergency Department (HOSPITAL_COMMUNITY)
Admission: EM | Admit: 2014-01-26 | Discharge: 2014-01-26 | Disposition: A | Discharge status: Home / Self Care | Payer: 59 | Source: Home / Self Care | Attending: Family Medicine | Admitting: Family Medicine

## 2014-01-26 ENCOUNTER — Encounter (HOSPITAL_COMMUNITY): Payer: Self-pay | Admitting: Emergency Medicine

## 2014-01-26 DIAGNOSIS — J069 Acute upper respiratory infection, unspecified: Secondary | ICD-10-CM

## 2014-01-26 MED ORDER — HYDROCOD POLST-CHLORPHEN POLST 10-8 MG/5ML PO LQCR: 5.0000 mL | Freq: Two times a day (BID) | ORAL | Status: DC | PRN

## 2014-01-26 MED ORDER — IPRATROPIUM BROMIDE 0.06 % NA SOLN: 2.0000 | Freq: Four times a day (QID) | NASAL | Status: DC

## 2014-01-26 NOTE — ED Provider Notes (Addendum)
CSN: [id number]     Arrival date & time 01/26/14  5427 History   None    Chief Complaint  Patient presents with  . URI   (Consider location/radiation/quality/duration/timing/severity/associated sxs/prior Treatment) Patient is a 48 y.o. female presenting with URI. The history is provided by the patient.  URI Presenting symptoms: congestion, cough, rhinorrhea and sore throat   Presenting symptoms: no fever   Severity:  Mild Onset quality:  Gradual Progression:  Waxing and waning Chronicity:  New Ineffective treatments:  OTC medications Associated symptoms: no myalgias   Risk factors: sick contacts     Past Medical History  Diagnosis Date  . Anxiety   . Hyperlipidemia   . Anemia     iron deficiency  . GERD (gastroesophageal reflux disease)    Past Surgical History  Procedure Laterality Date  . 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 Cholecystectomy  11-2005  . Ankle surgery  1986    left  . Colonoscopy  01/06/2013    Procedure: COLONOSCOPY;  Surgeon: Beryle Beams, MD;  Location: [facility];  Service: Gastroenterology;  Laterality: N/A;   Family History  Problem Relation Age of Onset  . Hypertension Mother   . Hypertension Father   . Cancer Mother     non-dodgkins mantel cell  . Rectal cancer Maternal Grandmother   . Breast cancer Maternal Aunt    History  Substance Use Topics  . Smoking status: Never Smoker   . Smokeless tobacco: Never Used  . Alcohol Use: No   OB History   Grav Para Term Preterm Abortions TAB SAB Ect Mult Living                 Review of Systems  Constitutional: Negative.  Negative for fever.  HENT: Positive for congestion, postnasal drip, rhinorrhea and sore throat.   Respiratory: Positive for cough.   Cardiovascular: Negative.   Gastrointestinal: Negative.   Genitourinary: Negative.   Musculoskeletal: Negative for myalgias.    Allergies  Anesthetics, amide and Penicillins  Home Medications   Current Outpatient Rx  Name  Route  Sig  Dispense   Refill  . amphetamine-dextroamphetamine (ADDERALL XR) 10 MG 24 hr capsule   Oral   Take 1 capsule (10 mg total) by mouth every morning.   30 capsule   0     Do not fill until December 04, 2013   . B Complex Vitamins (B COMPLEX 1 PO)   Oral   Take by mouth daily.           . cetirizine (ZYRTEC) 10 MG tablet   Oral   Take 10 mg by mouth daily.           . clarithromycin (BIAXIN XL) 500 MG 24 hr tablet   Oral   Take 2 tablets (1,000 mg total) by mouth daily.   28 tablet   0   . guaiFENesin-codeine (CHERATUSSIN AC) 100-10 MG/5ML syrup      1-2 tsp po qhs prn cough   120 mL   0   . methotrexate (RHEUMATREX) 2.5 MG tablet   Oral   Take 6 tablets by mouth once a week.         . Multiple Vitamin (MULTIVITAMIN PO)   Oral   Take 2 tablets by mouth daily.           Marland Kitchen PARoxetine (PAXIL) 10 MG tablet      1.5 tabs daily   45 tablet   11   . predniSONE (  DELTASONE) 10 MG tablet      3 po qd for 3 days then 2 po qd for 3 days the 1 po qd for 3 days   18 tablet   0   . Vitamin D, Ergocalciferol, (DRISDOL) 50000 UNITS CAPS   Oral   Take 50,000 Units by mouth every 7 (seven) days.            BP 124/76  Pulse 69  Temp(Src) 98.5 F (36.9 C) (Oral)  Resp 18  SpO2 100% Physical Exam  Nursing note and vitals reviewed. Constitutional: She is oriented to person, place, and time. She appears well-developed and well-nourished.  HENT:  Head: Normocephalic.  Right Ear: External ear normal.  Left Ear: External ear normal.  Mouth/Throat: Oropharynx is clear and moist.  Neck: Normal range of motion. Neck supple.  Cardiovascular: Normal rate, regular rhythm, normal heart sounds and intact distal pulses.   Pulmonary/Chest: Effort normal and breath sounds normal.  Lymphadenopathy:    She has no cervical adenopathy.  Neurological: She is alert and oriented to person, place, and time.  Skin: Skin is warm and dry.    ED Course  Procedures (including critical care  time) Labs Review Labs Reviewed - No data to display Imaging Review No results found.    MDM      Billy Fischer, MD 01/26/14 [place], MD 01/31/14 2005

## 2014-01-26 NOTE — Discharge Instructions (Signed)
Drink plenty of fluids as discussed, use medicine as prescribed, and mucinex for cough. Return or see your doctor if further problems °

## 2014-01-26 NOTE — ED Notes (Signed)
C/o cold sx States she has nasal drainage, sneezing, non productive cough which is making her throat sore Denies any vomiting and diarrhea Did use zyrtec and cold medication but no relief.

## 2014-01-30 ENCOUNTER — Encounter: Payer: Self-pay | Admitting: Family Medicine

## 2014-01-30 ENCOUNTER — Ambulatory Visit (INDEPENDENT_AMBULATORY_CARE_PROVIDER_SITE_OTHER): Payer: 59 | Admitting: Family Medicine

## 2014-01-30 VITALS — BP 111/64 | HR 89 | Temp 98.4°F | Wt 229.4 lb

## 2014-01-30 DIAGNOSIS — J019 Acute sinusitis, unspecified: Secondary | ICD-10-CM

## 2014-01-30 DIAGNOSIS — J209 Acute bronchitis, unspecified: Secondary | ICD-10-CM

## 2014-01-30 MED ORDER — PREDNISONE 10 MG PO TABS: ORAL_TABLET | ORAL | Status: DC

## 2014-01-30 MED ORDER — GUAIFENESIN-CODEINE 100-10 MG/5ML PO SYRP: ORAL_SOLUTION | ORAL | Status: DC

## 2014-01-30 MED ORDER — CLARITHROMYCIN ER 500 MG PO TB24: 1000.0000 mg | ORAL_TABLET | Freq: Every day | ORAL | Status: AC

## 2014-01-30 NOTE — Progress Notes (Signed)
Pre visit review using our clinic review tool, if applicable. No additional management support is needed unless otherwise documented below in the visit note. 

## 2014-01-30 NOTE — Patient Instructions (Signed)

## 2014-01-30 NOTE — Progress Notes (Signed)
  Subjective:     Martha Hampton is a 48 y.o. female who presents for evaluation of symptoms of a URI. Symptoms include bilateral ear pressure/pain, congestion, cough described as productive and worsening over time, nasal congestion and sinus pressure. Onset of symptoms was 5 days ago, and has been gradually worsening since that time. Treatment to date: cough suppressants.  The following portions of the patient's history were reviewed and updated as appropriate: allergies, current medications, past family history, past medical history, past social history, past surgical history and problem list.  Review of Systems Pertinent items are noted in HPI.   Objective:    BP 111/64  Pulse 89  Temp(Src) 98.4 F (36.9 C) (Oral)  Wt 229 lb 6.4 oz (104.055 kg)  SpO2 98% General appearance: alert, cooperative, appears stated age and no distress Ears: B/L TM dull Nose: green discharge, moderate congestion, sinus tenderness bilateral Throat: lips, mucosa, and tongue normal; teeth and gums normal Neck: mild anterior cervical adenopathy, supple, symmetrical, trachea midline and thyroid not enlarged, symmetric, no tenderness/mass/nodules Lungs: diminished breath sounds bilaterally Heart: S1, S2 normal   Assessment:    bronchitis and sinusitis   Plan:    Discussed the diagnosis and treatment of sinusitis. Suggested symptomatic OTC remedies. Nasal saline spray for congestion. biaxin per orders. Follow up as needed. -

## 2014-02-05 ENCOUNTER — Other Ambulatory Visit: Payer: Self-pay | Admitting: *Deleted

## 2014-02-05 DIAGNOSIS — B370 Candidal stomatitis: Secondary | ICD-10-CM

## 2014-02-05 MED ORDER — MAGIC MOUTHWASH: 5.0000 mL | Freq: Three times a day (TID) | ORAL | Status: DC | PRN

## 2014-02-05 NOTE — Telephone Encounter (Signed)
Rx for magic mouthwash sent to [facility]. Patient made aware.

## 2014-04-05 ENCOUNTER — Encounter: Payer: Self-pay | Admitting: Family Medicine

## 2014-04-05 ENCOUNTER — Ambulatory Visit (INDEPENDENT_AMBULATORY_CARE_PROVIDER_SITE_OTHER): Payer: 59 | Admitting: Family Medicine

## 2014-04-05 VITALS — BP 110/70 | HR 72 | Temp 98.3°F | Wt 232.4 lb

## 2014-04-05 DIAGNOSIS — B3731 Acute candidiasis of vulva and vagina: Secondary | ICD-10-CM

## 2014-04-05 DIAGNOSIS — B373 Candidiasis of vulva and vagina: Secondary | ICD-10-CM

## 2014-04-05 DIAGNOSIS — J019 Acute sinusitis, unspecified: Secondary | ICD-10-CM

## 2014-04-05 LAB — CBC WITH DIFFERENTIAL/PLATELET
BASOS ABS: 0 10*3/uL (ref 0.0–0.1)
Basophils Relative: 0.3 % (ref 0.0–3.0)
EOS ABS: 0.2 10*3/uL (ref 0.0–0.7)
Eosinophils Relative: 2.1 % (ref 0.0–5.0)
HEMATOCRIT: 34.7 % — AB (ref 36.0–46.0)
Hemoglobin: 11.4 g/dL — ABNORMAL LOW (ref 12.0–15.0)
LYMPHS ABS: 1.5 10*3/uL (ref 0.7–4.0)
Lymphocytes Relative: 20.3 % (ref 12.0–46.0)
MCHC: 32.8 g/dL (ref 30.0–36.0)
MCV: 85.8 fl (ref 78.0–100.0)
MONO ABS: 0.6 10*3/uL (ref 0.1–1.0)
Monocytes Relative: 7.6 % (ref 3.0–12.0)
NEUTROS PCT: 69.7 % (ref 43.0–77.0)
Neutro Abs: 5.1 10*3/uL (ref 1.4–7.7)
PLATELETS: 338 10*3/uL (ref 150.0–400.0)
RBC: 4.04 Mil/uL (ref 3.87–5.11)
RDW: 15.6 % — AB (ref 11.5–14.6)
WBC: 7.3 10*3/uL (ref 4.5–10.5)

## 2014-04-05 MED ORDER — FLUCONAZOLE 150 MG PO TABS: ORAL_TABLET | ORAL | Status: DC

## 2014-04-05 MED ORDER — SULFAMETHOXAZOLE-TMP DS 800-160 MG PO TABS: 1.0000 | ORAL_TABLET | Freq: Two times a day (BID) | ORAL | Status: DC

## 2014-04-05 NOTE — Patient Instructions (Signed)

## 2014-04-05 NOTE — Progress Notes (Signed)
  Subjective:     Martha Hampton is a 48 y.o. female here for evaluation of a cough. Onset of symptoms was 1 week ago. Symptoms have been gradually worsening since that time. The cough is dry and productive and is aggravated by exercise and reclining position. Associated symptoms include: postnasal drip, shortness of breath and sputum production. Patient does not have a history of asthma. Patient does not have a history of environmental allergens. Patient has not traveled recently. Patient does not have a history of smoking. Patient has not had a previous chest x-ray. Patient has not had a PPD done.  The following portions of the patient's history were reviewed and updated as appropriate: allergies, current medications, past family history, past medical history, past social history, past surgical history and problem list.  Review of Systems Pertinent items are noted in HPI.    Objective:    Oxygen saturation 98% on room air BP 110/70  Pulse 72  Temp(Src) 98.3 F (36.8 C) (Oral)  Wt 232 lb 6.4 oz (105.416 kg)  SpO2 98% General appearance: alert, cooperative, appears stated age and no distress Ears: normal TM's and external ear canals both ears Nose: green discharge, moderate congestion, turbinates red, swollen, sinus tenderness bilateral Throat: lips, mucosa, and tongue normal; teeth and gums normal Neck: mild anterior cervical adenopathy, supple, symmetrical, trachea midline and thyroid not enlarged, symmetric, no tenderness/mass/nodules Lungs: clear to auscultation bilaterally Heart: S1, S2 normal    Assessment:    Sinusitis    Plan:    Antibiotics per medication orders. Avoid exposure to tobacco smoke and fumes. Call if shortness of breath worsens, blood in sputum, change in character of cough, development of fever or chills, inability to maintain nutrition and hydration. Avoid exposure to tobacco smoke and fumes. Trial of steroid nasal spray.

## 2014-04-05 NOTE — Progress Notes (Signed)
Pre visit review using our clinic review tool, if applicable. No additional management support is needed unless otherwise documented below in the visit note. 

## 2014-06-20 ENCOUNTER — Telehealth: Payer: Self-pay | Admitting: Family Medicine

## 2014-06-20 NOTE — Telephone Encounter (Signed)
Caller name: Javeah Relation to pt: Call back number:[phone]   Reason for call:  Pt would like refills on RX amphetamine-dextroamphetamine (ADDERALL XR) 10 MG 24 hr capsule ( 3 month supply)

## 2014-06-20 NOTE — Telephone Encounter (Signed)
amphetamine-dextroamphetamine (ADDERALL XR) 10 MG 24 hr capsule   Last filled:  10/04/13--3 month supply Last OV:  04/05/14 No UDS or contract on file  Please advise.

## 2014-06-20 NOTE — Telephone Encounter (Signed)
Pt needs ov. 

## 2014-06-21 NOTE — Telephone Encounter (Signed)
Tried to leave VM, but voicemail box was not set up. Will try again this afternoon

## 2014-06-21 NOTE — Telephone Encounter (Signed)
Please offer the patient an apt for medication management.     KP

## 2014-08-13 ENCOUNTER — Telehealth: Payer: Self-pay | Admitting: Family Medicine

## 2014-08-13 DIAGNOSIS — F988 Other specified behavioral and emotional disorders with onset usually occurring in childhood and adolescence: Secondary | ICD-10-CM

## 2014-08-13 MED ORDER — AMPHETAMINE-DEXTROAMPHET ER 10 MG PO CP24: 10.0000 mg | ORAL_CAPSULE | ORAL | Status: DC

## 2014-08-13 NOTE — Telephone Encounter (Signed)
Tried calling the patient, VM not setup yet. Rx left at check in.     KP

## 2014-08-13 NOTE — Telephone Encounter (Signed)
Caller name: Lyllie  Relation to pt: self  Call back number: [phone]    Reason for call:   pt requesting  amphetamine-dextroamphetamine (ADDERALL XR) 10 MG 24 hr capsule script. Pt is off today from work and would like to pick up script please call to notify when ready.

## 2014-08-21 ENCOUNTER — Encounter: Payer: Self-pay | Admitting: Family Medicine

## 2014-08-21 ENCOUNTER — Ambulatory Visit (INDEPENDENT_AMBULATORY_CARE_PROVIDER_SITE_OTHER): Payer: 59 | Admitting: Family Medicine

## 2014-08-21 VITALS — BP 122/80 | HR 88 | Temp 97.5°F | Wt 238.8 lb

## 2014-08-21 DIAGNOSIS — F988 Other specified behavioral and emotional disorders with onset usually occurring in childhood and adolescence: Secondary | ICD-10-CM

## 2014-08-21 DIAGNOSIS — R319 Hematuria, unspecified: Secondary | ICD-10-CM

## 2014-08-21 DIAGNOSIS — N390 Urinary tract infection, site not specified: Secondary | ICD-10-CM

## 2014-08-21 DIAGNOSIS — R300 Dysuria: Secondary | ICD-10-CM

## 2014-08-21 LAB — POCT URINALYSIS DIPSTICK
Bilirubin, UA: NEGATIVE
Glucose, UA: NEGATIVE
Ketones, UA: NEGATIVE
NITRITE UA: POSITIVE
PH UA: 5
Spec Grav, UA: 1.01
UROBILINOGEN UA: 0.2

## 2014-08-21 MED ORDER — AMPHETAMINE-DEXTROAMPHET ER 10 MG PO CP24: 10.0000 mg | ORAL_CAPSULE | Freq: Every day | ORAL | Status: DC

## 2014-08-21 MED ORDER — SULFAMETHOXAZOLE-TMP DS 800-160 MG PO TABS: 1.0000 | ORAL_TABLET | Freq: Two times a day (BID) | ORAL | Status: DC

## 2014-08-21 MED ORDER — AMPHETAMINE-DEXTROAMPHET ER 10 MG PO CP24: 10.0000 mg | ORAL_CAPSULE | ORAL | Status: DC

## 2014-08-21 NOTE — Progress Notes (Signed)
Pre visit review using our clinic review tool, if applicable. No additional management support is needed unless otherwise documented below in the visit note. 

## 2014-08-21 NOTE — Progress Notes (Signed)
  Subjective:    Martha Hampton is a 48 y.o. female who complains of burning with urination. She has had symptoms for 2 days. Patient also complains of stomach ache. Patient denies back pain, congestion, cough, fever, headache, rhinitis, sorethroat and vaginal discharge. Patient does not have a history of recurrent UTI. Patient does not have a history of pyelonephritis.   The following portions of the patient's history were reviewed and updated as appropriate: allergies, current medications, past family history, past medical history, past social history, past surgical history and problem list.  Review of Systems Pertinent items are noted in HPI.    Objective:    BP 122/80  Pulse 88  Temp(Src) 97.5 F (36.4 C) (Oral)  Wt 238 lb 12.1 oz (108.3 kg)  SpO2 98% General appearance: alert, cooperative, appears stated age and no distress Neck: no adenopathy, supple, symmetrical, trachea midline and thyroid not enlarged, symmetric, no tenderness/mass/nodules Lungs: clear to auscultation bilaterally Heart: S1, S2 normal Extremities: extremities normal, atraumatic, no cyanosis or edema  Laboratory:  Urine dipstick: large for hemoglobin, 3+ for leukocyte esterase and positive for nitrites.   Micro exam: not done.    Assessment:    UTI     Plan:    Medications: TMP/SMX. Maintain adequate hydration. Follow up if symptoms not improving, and as needed.   1. Dysuria  - POCT Urinalysis Dipstick - Urine Culture  2. Urinary tract infection with hematuria, site unspecified  - sulfamethoxazole-trimethoprim (BACTRIM DS) 800-160 MG per tablet; Take 1 tablet by mouth 2 (two) times daily.  Dispense: 20 tablet; Refill: 0  3. ADD (attention deficit disorder)  - amphetamine-dextroamphetamine (ADDERALL XR) 10 MG 24 hr capsule; Take 1 capsule (10 mg total) by mouth every morning.  Dispense: 30 capsule; Refill: 0 - amphetamine-dextroamphetamine (ADDERALL XR) 10 MG 24 hr capsule; Take 1 capsule (10 mg  total) by mouth daily.  Dispense: 30 capsule; Refill: 0

## 2014-08-21 NOTE — Patient Instructions (Signed)

## 2014-08-21 NOTE — Addendum Note (Signed)
Addended by: Rosalita Chessman on: 08/21/2014 09:52 AM   Modules accepted: Orders

## 2014-08-23 LAB — URINE CULTURE: Colony Count: >=100000

## 2014-08-28 ENCOUNTER — Telehealth: Payer: Self-pay | Admitting: *Deleted

## 2014-08-28 MED ORDER — CIPROFLOXACIN HCL 500 MG PO TABS: 500.0000 mg | ORAL_TABLET | Freq: Two times a day (BID) | ORAL | Status: DC

## 2014-08-28 NOTE — Telephone Encounter (Signed)
Rx sent [id number] - [place], [place].  Attempted to call pt to make her aware.  No answer.  Will try calling again later.

## 2014-08-28 NOTE — Telephone Encounter (Signed)
Spoke with patient who expressed her frustrations with having to go all weekend without any relief from her current prescription.  She was very upset that Dr. Sherren Mocha would not prescribe her anything to get her through the weekend.  Please see note below for details.    Today she still does not feel well. Pain and pressure on urination.  No blood in urine.  Afebrile.  She would like a prescription for Cipro.  She also shares that she feels she's getting a yeast infection, but states she plans to self treat with Monistat.    Please advise.

## 2014-08-28 NOTE — Telephone Encounter (Signed)
cipro 500 mg 1 po bid x 5 days---- she had bactrim on her med list which should have taken care of it -- may be why Dr todd did not call anything in

## 2014-08-28 NOTE — Telephone Encounter (Signed)
Called work number.  Phone busy.

## 2014-08-28 NOTE — Telephone Encounter (Signed)
Pt called office. She was made aware that her Cipro prescription has [facility].  No further needs expressed.

## 2014-08-28 NOTE — Telephone Encounter (Signed)
[facility] Triage Call Report Triage Record Num: [id number] Operator: Feliberto Harts Patient Name: Martha Hampton Call Date & Time: 08/25/2014 12:36:31PM Patient Phone: [phone] PCP: Patient Gender: Female PCP Fax : Patient DOB: December 22, 1965 Practice Name: [facility] Reason for Call: Caller: [name]/Patient; PCP: Rosalita Chessman.; CB#: [phone]; Call regarding Has a UTI and medication not helping.; Seen in office 08/21/2014 and diagnosed with a UTI. Placed on Bactrim DS [phone] and Takes one tab BID x 10 days. Per C&S UTI is sensitive to Bactrim DS. Not any better since 08/21/2014. Still hurts on urination. Took AZO 08/24/2014. Also seeing a little blood in urine. Afebrile. Triaged per Urinary Symptoms guideline. Disposition See Provider within 2 weeks per Bloody U[facility] . Dr Sherren Mocha contacted and made aware of symptoms. Orders received for pt to go to [facility] to be re evaluated. Pt notified. Pt states on vacation and can't go to [facility] could MD call her in something else until she can be seen at office on Tues. Dr Sherren Mocha called back and made aware pt out of town and pt request. States pt has to be evaluated again and he cannot prescribe medication over the phone. Pt notified and upset that a different medication can't be called in. Protocol(s) Used: Bloody Urine Protocol(s) Used: Urinary Symptoms - Female Recommended Outcome per Protocol: See Provider within 2 Weeks Reason for Outcome: Blood in urine Any blood in urine Care Advice:

## 2014-09-03 ENCOUNTER — Telehealth: Payer: Self-pay | Admitting: Family Medicine

## 2014-09-03 DIAGNOSIS — F419 Anxiety disorder, unspecified: Secondary | ICD-10-CM

## 2014-09-03 MED ORDER — PAROXETINE HCL 10 MG PO TABS
ORAL_TABLET | ORAL | Status: DC
Start: 2014-09-03 — End: 2014-11-30

## 2014-09-03 NOTE — Telephone Encounter (Signed)
[facility], PARoxetine (PAXIL) 10 MG tablet Pt gets 45 tabs takes 1 1/2 daily.

## 2014-11-19 ENCOUNTER — Ambulatory Visit (INDEPENDENT_AMBULATORY_CARE_PROVIDER_SITE_OTHER): Payer: 59 | Admitting: Family Medicine

## 2014-11-19 ENCOUNTER — Telehealth: Payer: Self-pay | Admitting: Family Medicine

## 2014-11-19 ENCOUNTER — Encounter: Payer: Self-pay | Admitting: Family Medicine

## 2014-11-19 VITALS — BP 150/90 | HR 94 | Temp 98.9°F | Wt 238.0 lb

## 2014-11-19 DIAGNOSIS — F418 Other specified anxiety disorders: Secondary | ICD-10-CM

## 2014-11-19 MED ORDER — ALPRAZOLAM 0.25 MG PO TABS: 0.2500 mg | ORAL_TABLET | Freq: Three times a day (TID) | ORAL | Status: DC | PRN

## 2014-11-19 MED ORDER — PAROXETINE HCL 20 MG PO TABS: 20.0000 mg | ORAL_TABLET | Freq: Every day | ORAL | Status: DC

## 2014-11-19 NOTE — Progress Notes (Signed)
   Subjective:    Patient ID: Martha Hampton, female    DOB: Jul 13, 1966, 48 y.o.   MRN: [id number]  HPI Pt is here c/o severe depression and anxiety.  Her husband left her 3 weeks ago for a neighbor in their new neighborhood at [place].  He told her after 30 years he did not love her anymore.  Pt is not sleeping and is crying constantly.   Review of Systems As above    Objective:   Physical Exam BP 150/90 mmHg  Pulse 94  Temp(Src) 98.9 F (37.2 C) (Oral)  Wt 238 lb (107.956 kg)  SpO2 97% General appearance: alert, crying Neurologic: Mental status: Alert, oriented, thought content appropriate, when questioned about suicide, the patient expresses no suicidal ideation, no homicidal ideation-- pt is crying uncontrollably       Assessment & Plan:  1. Depression with anxiety rto 1 month or sooner prn We will get the pt an appointment with counselor Pt is not suicidal - PARoxetine (PAXIL) 20 MG tablet; Take 1 tablet (20 mg total) by mouth daily.  Dispense: 30 tablet; Refill: 5 - ALPRAZolam (XANAX) 0.25 MG tablet; Take 1 tablet (0.25 mg total) by mouth 3 (three) times daily as needed for anxiety or sleep.  Dispense: 30 tablet; Refill: 0 - Ambulatory referral to Psychology Pt here for 30 min > 50% face to face

## 2014-11-19 NOTE — Telephone Encounter (Signed)
To MD for review     KP 

## 2014-11-19 NOTE — Telephone Encounter (Signed)
Tell her to come right after lunch and we will get her in-- but remind her my schedule is full and she will most like have to wait a bit

## 2014-11-19 NOTE — Patient Instructions (Addendum)
Generalized Anxiety Disorder Generalized anxiety disorder (GAD) is a mental disorder. It interferes with life functions, including relationships, work, and school. GAD is different from normal anxiety, which everyone experiences at some point in their lives in response to specific life events and activities. Normal anxiety actually helps us prepare for and get through these life events and activities. Normal anxiety goes away after the event or activity is over.  GAD causes anxiety that is not necessarily related to specific events or activities. It also causes excess anxiety in proportion to specific events or activities. The anxiety associated with GAD is also difficult to control. GAD can vary from mild to severe. People with severe GAD can have intense waves of anxiety with physical symptoms (panic attacks).  SYMPTOMS The anxiety and worry associated with GAD are difficult to control. This anxiety and worry are related to many life events and activities and also occur more days than not for 6 months or longer. People with GAD also have three or more of the following symptoms (one or more in children):  Restlessness.   Fatigue.  Difficulty concentrating.   Irritability.  Muscle tension.  Difficulty sleeping or unsatisfying sleep. DIAGNOSIS GAD is diagnosed through an assessment by your health care provider. Your health care provider will ask you questions aboutyour mood,physical symptoms, and events in your life. Your health care provider may ask you about your medical history and use of alcohol or drugs, including prescription medicines. Your health care provider may also do a physical exam and blood tests. Certain medical conditions and the use of certain substances can cause symptoms similar to those associated with GAD. Your health care provider may refer you to a mental health specialist for further evaluation. TREATMENT The following therapies are usually used to treat GAD:    Medication. Antidepressant medication usually is prescribed for long-term daily control. Antianxiety medicines may be added in severe cases, especially when panic attacks occur.   Talk therapy (psychotherapy). Certain types of talk therapy can be helpful in treating GAD by providing support, education, and guidance. A form of talk therapy called cognitive behavioral therapy can teach you healthy ways to think about and react to daily life events and activities.  Stress managementtechniques. These include yoga, meditation, and exercise and can be very helpful when they are practiced regularly. A mental health specialist can help determine which treatment is best for you. Some people see improvement with one therapy. However, other people require a combination of therapies. Document Released: 04/03/2013 Document Revised: 04/23/2014 Document Reviewed: 04/03/2013 ExitCare Patient Information 2015 [employer]. This information is not intended to replace advice given to you by your health care provider. Make sure you discuss any questions you have with your health care provider.  

## 2014-11-19 NOTE — Telephone Encounter (Signed)
Called patient and made her aware. She said that would be fine.  Please double book Dr. Nonda Lou 1:00 pm appointment.

## 2014-11-19 NOTE — Progress Notes (Signed)
Pre visit review using our clinic review tool, if applicable. No additional management support is needed unless otherwise documented below in the visit note. 

## 2014-11-19 NOTE — Telephone Encounter (Signed)
Patients husband has left her and patients states "she is falling apart" and needs to be seen today, she can not function and needs something for her nerves

## 2014-11-20 ENCOUNTER — Ambulatory Visit: Payer: 59 | Admitting: Family Medicine

## 2014-11-23 ENCOUNTER — Ambulatory Visit (INDEPENDENT_AMBULATORY_CARE_PROVIDER_SITE_OTHER): Payer: 59 | Admitting: Psychology

## 2014-11-23 DIAGNOSIS — F4323 Adjustment disorder with mixed anxiety and depressed mood: Secondary | ICD-10-CM

## 2014-11-30 ENCOUNTER — Other Ambulatory Visit: Payer: Self-pay | Admitting: Family Medicine

## 2014-11-30 ENCOUNTER — Telehealth: Payer: Self-pay | Admitting: Family Medicine

## 2014-11-30 ENCOUNTER — Ambulatory Visit (INDEPENDENT_AMBULATORY_CARE_PROVIDER_SITE_OTHER): Payer: 59 | Admitting: Psychology

## 2014-11-30 DIAGNOSIS — F418 Other specified anxiety disorders: Secondary | ICD-10-CM

## 2014-11-30 DIAGNOSIS — A609 Anogenital herpesviral infection, unspecified: Secondary | ICD-10-CM

## 2014-11-30 DIAGNOSIS — Z202 Contact with and (suspected) exposure to infections with a predominantly sexual mode of transmission: Secondary | ICD-10-CM

## 2014-11-30 DIAGNOSIS — F4323 Adjustment disorder with mixed anxiety and depressed mood: Secondary | ICD-10-CM

## 2014-11-30 MED ORDER — PAROXETINE HCL 20 MG PO TABS: 20.0000 mg | ORAL_TABLET | Freq: Every day | ORAL | Status: DC

## 2014-11-30 NOTE — Telephone Encounter (Signed)
lab orders are in, please schedule      KP

## 2014-11-30 NOTE — Telephone Encounter (Signed)
Paxil re-faxed.    Please advise on patient request.      KP

## 2014-11-30 NOTE — Telephone Encounter (Signed)
Caller name:Clapp Lessie Relation to SP:ZZCK Call back number:[phone] Pharmacy:[phone]  Reason for call: pt states that dr. Etter Sjogren increased her rx PARoxetine (PAXIL) 20 MG tablet , however the pharmacy did not get the new rx, pt also would like to know if she can get an order for labs for STD.

## 2014-11-30 NOTE — Telephone Encounter (Signed)
Orders in 

## 2014-12-04 NOTE — Telephone Encounter (Signed)
appt scheduled for pt.  

## 2014-12-06 ENCOUNTER — Other Ambulatory Visit (INDEPENDENT_AMBULATORY_CARE_PROVIDER_SITE_OTHER): Payer: 59

## 2014-12-06 DIAGNOSIS — Z202 Contact with and (suspected) exposure to infections with a predominantly sexual mode of transmission: Secondary | ICD-10-CM

## 2014-12-06 LAB — HIV ANTIBODY (ROUTINE TESTING W REFLEX): HIV: NONREACTIVE

## 2014-12-06 LAB — RPR

## 2014-12-07 LAB — HSV 2 ANTIBODY, IGG: HSV 2 Glycoprotein G Ab, IgG: <0.1 IV

## 2014-12-10 ENCOUNTER — Ambulatory Visit (INDEPENDENT_AMBULATORY_CARE_PROVIDER_SITE_OTHER): Payer: 59 | Admitting: Psychology

## 2014-12-10 ENCOUNTER — Telehealth: Payer: Self-pay | Admitting: Family Medicine

## 2014-12-10 DIAGNOSIS — F4323 Adjustment disorder with mixed anxiety and depressed mood: Secondary | ICD-10-CM

## 2014-12-10 DIAGNOSIS — F418 Other specified anxiety disorders: Secondary | ICD-10-CM

## 2014-12-10 MED ORDER — ALPRAZOLAM 0.25 MG PO TABS: 0.2500 mg | ORAL_TABLET | Freq: Three times a day (TID) | ORAL | Status: DC | PRN

## 2014-12-10 NOTE — Telephone Encounter (Signed)
Last seen and filled 11/19/14 #30.  Please advise       KP

## 2014-12-10 NOTE — Telephone Encounter (Signed)
Rx faxed.    KP 

## 2014-12-10 NOTE — Telephone Encounter (Signed)
Caller name:Clapp, Katharine Look Relation to ZV:GJFT Call back [phone] [facility]  Reason for call: pt is needing rx ALPRAZolam (XANAX) 0.25 MG tablet, also pt would like to know if her labs are back, states she never heard anything yet

## 2014-12-10 NOTE — Telephone Encounter (Signed)
Ok to refill x1--- see labs--- normal

## 2014-12-24 ENCOUNTER — Ambulatory Visit (INDEPENDENT_AMBULATORY_CARE_PROVIDER_SITE_OTHER): Payer: 59 | Admitting: Psychology

## 2014-12-24 DIAGNOSIS — F4323 Adjustment disorder with mixed anxiety and depressed mood: Secondary | ICD-10-CM

## 2014-12-31 ENCOUNTER — Ambulatory Visit: Payer: 59 | Admitting: Psychology

## 2015-01-11 ENCOUNTER — Ambulatory Visit: Payer: 59 | Admitting: Psychology

## 2015-03-14 ENCOUNTER — Telehealth: Payer: Self-pay | Admitting: Family Medicine

## 2015-03-14 DIAGNOSIS — F988 Other specified behavioral and emotional disorders with onset usually occurring in childhood and adolescence: Secondary | ICD-10-CM

## 2015-03-14 MED ORDER — AMPHETAMINE-DEXTROAMPHET ER 10 MG PO CP24: 10.0000 mg | ORAL_CAPSULE | Freq: Every day | ORAL | Status: DC

## 2015-03-14 MED ORDER — AMPHETAMINE-DEXTROAMPHET ER 10 MG PO CP24: 10.0000 mg | ORAL_CAPSULE | ORAL | Status: DC

## 2015-03-14 NOTE — Telephone Encounter (Signed)
Ok to refill 3 months  

## 2015-03-14 NOTE — Telephone Encounter (Signed)
Rx printed and left at check in and the patient has been made aware.      KP

## 2015-03-14 NOTE — Telephone Encounter (Signed)
Last seen 11/19/14 and filled 08/21/14 #30  No UDS and NO contract.   Please advise     KP

## 2015-03-14 NOTE — Telephone Encounter (Signed)
Caller name: Kanae Relation to pt: self Call back number: [phone] or [phone] Pharmacy:  Reason for call:   Requesting 3 month rx of adderall

## 2015-03-26 ENCOUNTER — Ambulatory Visit: Payer: 59 | Admitting: Family Medicine

## 2015-04-01 ENCOUNTER — Telehealth: Payer: Self-pay | Admitting: Family Medicine

## 2015-04-01 ENCOUNTER — Encounter: Payer: Self-pay | Admitting: Family Medicine

## 2015-04-01 ENCOUNTER — Ambulatory Visit (INDEPENDENT_AMBULATORY_CARE_PROVIDER_SITE_OTHER): Payer: 59 | Admitting: Family Medicine

## 2015-04-01 VITALS — BP 120/76 | HR 83 | Temp 97.9°F | Wt 201.8 lb

## 2015-04-01 DIAGNOSIS — T148 Other injury of unspecified body region: Secondary | ICD-10-CM

## 2015-04-01 DIAGNOSIS — T148XXA Other injury of unspecified body region, initial encounter: Secondary | ICD-10-CM

## 2015-04-01 NOTE — Patient Instructions (Signed)
Contusion °A contusion is a deep bruise. Contusions are the result of an injury that caused bleeding under the skin. The contusion may turn blue, purple, or yellow. Minor injuries will give you a painless contusion, but more severe contusions may stay painful and swollen for a few weeks.  °CAUSES  °A contusion is usually caused by a blow, trauma, or direct force to an area of the body. °SYMPTOMS  °· Swelling and redness of the injured area. °· Bruising of the injured area. °· Tenderness and soreness of the injured area. °· Pain. °DIAGNOSIS  °The diagnosis can be made by taking a history and physical exam. An X-ray, CT scan, or MRI may be needed to determine if there were any associated injuries, such as fractures. °TREATMENT  °Specific treatment will depend on what area of the body was injured. In general, the best treatment for a contusion is resting, icing, elevating, and applying cold compresses to the injured area. Over-the-counter medicines may also be recommended for pain control. Ask your caregiver what the best treatment is for your contusion. °HOME CARE INSTRUCTIONS  °· Put ice on the injured area. °¨ Put ice in a plastic bag. °¨ Place a towel between your skin and the bag. °¨ Leave the ice on for 15-20 minutes, 3-4 times a day, or as directed by your health care provider. °· Only take over-the-counter or prescription medicines for pain, discomfort, or fever as directed by your caregiver. Your caregiver may recommend avoiding anti-inflammatory medicines (aspirin, ibuprofen, and naproxen) for 48 hours because these medicines may increase bruising. °· Rest the injured area. °· If possible, elevate the injured area to reduce swelling. °SEEK IMMEDIATE MEDICAL CARE IF:  °· You have increased bruising or swelling. °· You have pain that is getting worse. °· Your swelling or pain is not relieved with medicines. °MAKE SURE YOU:  °· Understand these instructions. °· Will watch your condition. °· Will get help right  away if you are not doing well or get worse. °Document Released: 09/16/2005 Document Revised: 12/12/2013 Document Reviewed: 10/12/2011 °ExitCare® Patient Information ©2015 [employer]. This information is not intended to replace advice given to you by your health care provider. Make sure you discuss any questions you have with your health care provider. ° °

## 2015-04-01 NOTE — Telephone Encounter (Signed)
Not this time but if it occurs again-- yes

## 2015-04-01 NOTE — Progress Notes (Signed)
Subjective:    Patient ID: Martha Hampton, female    DOB: 04-24-1966, 49 y.o.   MRN: [id number]  HPI  Patient here c/o bruising easily x several weeks.  No other complaints.    Past Medical History  Diagnosis Date  . Anxiety   . Hyperlipidemia   . Anemia     iron deficiency  . GERD (gastroesophageal reflux disease)     Review of Systems  Constitutional: Negative for diaphoresis, appetite change, fatigue and unexpected weight change.  Eyes: Negative for pain, redness and visual disturbance.  Respiratory: Negative for cough, chest tightness, shortness of breath and wheezing.   Cardiovascular: Negative for chest pain, palpitations and leg swelling.  Endocrine: Negative for cold intolerance, heat intolerance, polydipsia, polyphagia and polyuria.  Genitourinary: Negative for dysuria, frequency and difficulty urinating.  Neurological: Negative for dizziness, light-headedness, numbness and headaches.    Current Outpatient Prescriptions on File Prior to Visit  Medication Sig Dispense Refill  . ALPRAZolam (XANAX) 0.25 MG tablet Take 1 tablet (0.25 mg total) by mouth 3 (three) times daily as needed for anxiety or sleep. 30 tablet 0  . amphetamine-dextroamphetamine (ADDERALL XR) 10 MG 24 hr capsule Take 1 capsule (10 mg total) by mouth daily. Do not fill until 04/2015 30 capsule 0  . B Complex Vitamins (B COMPLEX 1 PO) Take by mouth daily.      . cetirizine (ZYRTEC) 10 MG tablet Take 10 mg by mouth daily.      Marland Kitchen PARoxetine (PAXIL) 20 MG tablet Take 1 tablet (20 mg total) by mouth daily. 30 tablet 5  . Vitamin D, Ergocalciferol, (DRISDOL) 50000 UNITS CAPS Take 50,000 Units by mouth every 7 (seven) days.       No current facility-administered medications on file prior to visit.       Objective:    Physical Exam  Constitutional: She is oriented to person, place, and time. She appears well-developed and well-nourished. No distress.  HENT:  Right Ear: External ear normal.  Left Ear:  External ear normal.  Nose: Nose normal.  Mouth/Throat: Oropharynx is clear and moist.  Eyes: EOM are normal. Pupils are equal, round, and reactive to light.  Neck: Normal range of motion. Neck supple.  Cardiovascular: Normal rate, regular rhythm and normal heart sounds.   No murmur heard. Pulmonary/Chest: Effort normal and breath sounds normal. No respiratory distress. She has no wheezes. She has no rales. She exhibits no tenderness.  Neurological: She is alert and oriented to person, place, and time.  Skin: Ecchymosis noted.     Psychiatric: She has a normal mood and affect. Her behavior is normal. Judgment and thought content normal.    BP 120/76 mmHg  Pulse 83  Temp(Src) 97.9 F (36.6 C) (Oral)  Wt 201 lb 12.8 oz (91.536 kg)  SpO2 99% Wt Readings from Last 3 Encounters:  04/01/15 201 lb 12.8 oz (91.536 kg)  11/19/14 238 lb (107.956 kg)  08/21/14 238 lb 12.1 oz (108.3 kg)     Lab Results  Component Value Date   WBC 7.3 04/05/2014   HGB 11.4* 04/05/2014   HCT 34.7* 04/05/2014   PLT 338.0 04/05/2014   GLUCOSE 89 09/26/2012   CHOL 174 04/15/2009   TRIG 56.0 04/15/2009   HDL 47.20 04/15/2009   LDLCALC 116* 04/15/2009   ALT 14 08/10/2013   AST 17 08/10/2013   NA 137 09/26/2012   K 4.0 09/26/2012   CL 104 09/26/2012   CREATININE 0.8 09/26/2012   BUN  9 09/26/2012   CO2 26 09/26/2012   TSH 0.64 04/20/2011   INR 1.1 07/06/2008       Assessment & Plan:   Problem List Items Addressed This Visit    Bruising - Primary    Check labs Consider hematology if no improvement      Relevant Orders   Basic metabolic panel   CBC with Differential/Platelet   Hepatic function panel   Protime-INR   PTT      I have discontinued Ms. Clapp's Multiple Vitamin (MULTIVITAMIN PO). I am also having her maintain her B Complex Vitamins (B COMPLEX 1 PO), Vitamin D (Ergocalciferol), cetirizine, PARoxetine, ALPRAZolam, and amphetamine-dextroamphetamine.  No orders of the defined  types were placed in this encounter.     Garnet Koyanagi, DO

## 2015-04-01 NOTE — Progress Notes (Signed)
Pre visit review using our clinic review tool, if applicable. No additional management support is needed unless otherwise documented below in the visit note. 

## 2015-04-01 NOTE — Assessment & Plan Note (Signed)
Check labs Consider hematology if no improvement

## 2015-04-01 NOTE — Telephone Encounter (Signed)
Pt was no show for appt on 03/26/15- rescheduled for 04/01/15- charge ?

## 2015-04-02 LAB — HEPATIC FUNCTION PANEL
ALT: 13 U/L (ref 0–35)
AST: 20 U/L (ref 0–37)
Albumin: 4.1 g/dL (ref 3.5–5.2)
Alkaline Phosphatase: 111 U/L (ref 39–117)
BILIRUBIN DIRECT: 0.1 mg/dL (ref 0.0–0.3)
BILIRUBIN TOTAL: 0.5 mg/dL (ref 0.2–1.2)
TOTAL PROTEIN: 6.9 g/dL (ref 6.0–8.3)

## 2015-04-02 LAB — CBC WITH DIFFERENTIAL/PLATELET
BASOS ABS: 0.1 10*3/uL (ref 0.0–0.1)
Basophils Relative: 1.9 % (ref 0.0–3.0)
Eosinophils Absolute: 0.1 10*3/uL (ref 0.0–0.7)
Eosinophils Relative: 1.3 % (ref 0.0–5.0)
HCT: 32.9 % — ABNORMAL LOW (ref 36.0–46.0)
Hemoglobin: 10.5 g/dL — ABNORMAL LOW (ref 12.0–15.0)
Lymphocytes Relative: 30 % (ref 12.0–46.0)
Lymphs Abs: 2 10*3/uL (ref 0.7–4.0)
MCHC: 32 g/dL (ref 30.0–36.0)
MCV: 75.6 fl — ABNORMAL LOW (ref 78.0–100.0)
MONO ABS: 0.3 10*3/uL (ref 0.1–1.0)
MONOS PCT: 5.2 % (ref 3.0–12.0)
NEUTROS PCT: 61.6 % (ref 43.0–77.0)
Neutro Abs: 4 10*3/uL (ref 1.4–7.7)
PLATELETS: 365 10*3/uL (ref 150.0–400.0)
RBC: 4.36 Mil/uL (ref 3.87–5.11)
RDW: 15.7 % — AB (ref 11.5–15.5)
WBC: 6.6 10*3/uL (ref 4.0–10.5)

## 2015-04-02 LAB — PROTIME-INR
INR: 1 ratio (ref 0.8–1.0)
PROTHROMBIN TIME: 11.4 s (ref 9.6–13.1)

## 2015-04-02 LAB — BASIC METABOLIC PANEL
BUN: 10 mg/dL (ref 6–23)
CALCIUM: 9.1 mg/dL (ref 8.4–10.5)
CHLORIDE: 101 meq/L (ref 96–112)
CO2: 28 meq/L (ref 19–32)
Creatinine, Ser: 0.69 mg/dL (ref 0.40–1.20)
GFR: 96.3 mL/min (ref >60.00)
Glucose, Bld: 101 mg/dL — ABNORMAL HIGH (ref 70–99)
Potassium: 3.7 mEq/L (ref 3.5–5.1)
Sodium: 136 mEq/L (ref 135–145)

## 2015-04-02 LAB — APTT: aPTT: 28.6 s (ref 23.4–32.7)

## 2015-05-27 ENCOUNTER — Other Ambulatory Visit: Payer: Self-pay | Admitting: Plastic Surgery

## 2015-09-08 ENCOUNTER — Other Ambulatory Visit: Payer: Self-pay | Admitting: Family Medicine

## 2015-10-08 ENCOUNTER — Telehealth: Payer: Self-pay | Admitting: Family Medicine

## 2015-10-08 MED ORDER — AMPHETAMINE-DEXTROAMPHET ER 10 MG PO CP24: 10.0000 mg | ORAL_CAPSULE | Freq: Every day | ORAL | Status: DC

## 2015-10-08 NOTE — Telephone Encounter (Signed)
Last seen 04/01/15 and filled in May 2016 No pending apt UDS 03/14/15 Low risk   Please advise    KP

## 2015-10-08 NOTE — Telephone Encounter (Signed)
Refill x1--- due for appointment

## 2015-10-08 NOTE — Telephone Encounter (Signed)
Patient aware Rx is ready for pick up.      KP 

## 2015-10-08 NOTE — Telephone Encounter (Signed)
Caller name: Martha Hampton   Relationship to patient: Self   Can be reached: [phone]  Pharmacy: [id number] [place] [place]  Reason for call: pt is requesting a refill on her adderall. She would like to have it ready for pick up today if possible.

## 2015-10-28 ENCOUNTER — Ambulatory Visit (INDEPENDENT_AMBULATORY_CARE_PROVIDER_SITE_OTHER): Payer: 59 | Admitting: Family Medicine

## 2015-10-28 ENCOUNTER — Encounter: Payer: Self-pay | Admitting: Family Medicine

## 2015-10-28 VITALS — BP 134/80 | HR 88 | Temp 98.0°F | Ht 63.0 in | Wt 201.8 lb

## 2015-10-28 DIAGNOSIS — F411 Generalized anxiety disorder: Secondary | ICD-10-CM | POA: Diagnosis not present

## 2015-10-28 DIAGNOSIS — F418 Other specified anxiety disorders: Secondary | ICD-10-CM

## 2015-10-28 DIAGNOSIS — B029 Zoster without complications: Secondary | ICD-10-CM | POA: Diagnosis not present

## 2015-10-28 DIAGNOSIS — F988 Other specified behavioral and emotional disorders with onset usually occurring in childhood and adolescence: Secondary | ICD-10-CM

## 2015-10-28 DIAGNOSIS — F909 Attention-deficit hyperactivity disorder, unspecified type: Secondary | ICD-10-CM

## 2015-10-28 MED ORDER — PAROXETINE HCL 30 MG PO TABS: 30.0000 mg | ORAL_TABLET | Freq: Every day | ORAL | Status: DC

## 2015-10-28 MED ORDER — ALPRAZOLAM 0.25 MG PO TABS: 0.2500 mg | ORAL_TABLET | Freq: Three times a day (TID) | ORAL | Status: DC | PRN

## 2015-10-28 MED ORDER — AMPHETAMINE-DEXTROAMPHET ER 10 MG PO CP24: 10.0000 mg | ORAL_CAPSULE | Freq: Every day | ORAL | Status: DC

## 2015-10-28 MED ORDER — ACYCLOVIR 400 MG PO TABS: ORAL_TABLET | ORAL | Status: DC

## 2015-10-28 NOTE — Progress Notes (Signed)
Patient ID: Martha Hampton, female    DOB: 1966-10-17  Age: 49 y.o. MRN: [id number]    Subjective:  Subjective HPI Martha Hampton presents for f/u anxiety.  She is also upset because she took her husband back for one night and then he left again saying she was fat.  She has just started back to counseling.    Review of Systems  Constitutional: Negative for diaphoresis, appetite change, fatigue and unexpected weight change.  Eyes: Negative for pain, redness and visual disturbance.  Respiratory: Negative for cough, chest tightness, shortness of breath and wheezing.   Cardiovascular: Negative for chest pain, palpitations and leg swelling.  Endocrine: Negative for cold intolerance, heat intolerance, polydipsia, polyphagia and polyuria.  Genitourinary: Negative for dysuria, frequency and difficulty urinating.  Neurological: Negative for dizziness, light-headedness, numbness and headaches.  Psychiatric/Behavioral: Negative for dysphoric mood. The patient is nervous/anxious.     History Past Medical History  Diagnosis Date  . Anxiety   . Hyperlipidemia   . Anemia     iron deficiency  . GERD (gastroesophageal reflux disease)   . Genital herpes     She has past surgical history that includes Gastric bypass (07-04-07); Cholecystectomy (11-2005); Ankle surgery (1986); and Colonoscopy (01/06/2013).   Her family history includes Breast cancer in her maternal aunt; Cancer in her mother; Hypertension in her father and mother; Rectal cancer in her maternal grandmother.She reports that she has never smoked. She has never used smokeless tobacco. She reports that she does not drink alcohol or use illicit drugs.  Current Outpatient Prescriptions on File Prior to Visit  Medication Sig Dispense Refill  . B Complex Vitamins (B COMPLEX 1 PO) Take by mouth daily.      . cetirizine (ZYRTEC) 10 MG tablet Take 10 mg by mouth daily.      Marland Kitchen PARoxetine (PAXIL) 20 MG tablet TAKE 1 TABLET BY MOUTH EVERY DAY 30  tablet 5  . Vitamin D, Ergocalciferol, (DRISDOL) 50000 UNITS CAPS Take 50,000 Units by mouth every 7 (seven) days.       No current facility-administered medications on file prior to visit.     Objective:  Objective Physical Exam  Constitutional: She is oriented to person, place, and time. She appears well-developed and well-nourished.  HENT:  Head: Normocephalic and atraumatic.  Eyes: Conjunctivae and EOM are normal.  Neck: Normal range of motion. Neck supple. No JVD present. Carotid bruit is not present. No thyromegaly present.  Cardiovascular: Normal rate, regular rhythm and normal heart sounds.   No murmur heard. Pulmonary/Chest: Effort normal and breath sounds normal. No respiratory distress. She has no wheezes. She has no rales. She exhibits no tenderness.  Musculoskeletal: She exhibits no edema.  Neurological: She is alert and oriented to person, place, and time.  Psychiatric: Her speech is normal. Her mood appears anxious. She exhibits a depressed mood. She expresses no homicidal and no suicidal ideation. She expresses no suicidal plans and no homicidal plans.   BP 134/80 mmHg  Pulse 88  Temp(Src) 98 F (36.7 C) (Oral)  Ht 5\' 3"  (1.6 m)  Wt 201 lb 12.8 oz (91.536 kg)  BMI 35.76 kg/m2  SpO2 98% Wt Readings from Last 3 Encounters:  10/28/15 201 lb 12.8 oz (91.536 kg)  04/01/15 201 lb 12.8 oz (91.536 kg)  11/19/14 238 lb (107.956 kg)     Lab Results  Component Value Date   WBC 6.6 04/01/2015   HGB 10.5* 04/01/2015   HCT 32.9* 04/01/2015   PLT  365.0 04/01/2015   GLUCOSE 101* 04/01/2015   CHOL 174 04/15/2009   TRIG 56.0 04/15/2009   HDL 47.20 04/15/2009   LDLCALC 116* 04/15/2009   ALT 13 04/01/2015   AST 20 04/01/2015   NA 136 04/01/2015   K 3.7 04/01/2015   CL 101 04/01/2015   CREATININE 0.69 04/01/2015   BUN 10 04/01/2015   CO2 28 04/01/2015   TSH 0.64 04/20/2011   INR 1.0 04/01/2015    No results found.   Assessment & Plan:  Plan I am having [name] start on PARoxetine, amphetamine-dextroamphetamine, and amphetamine-dextroamphetamine. I am also having her maintain her B Complex Vitamins (B COMPLEX 1 PO), Vitamin D (Ergocalciferol), cetirizine, PARoxetine, acyclovir ointment, acyclovir, ALPRAZolam, and amphetamine-dextroamphetamine.  Meds ordered this encounter  Medications  . acyclovir ointment (ZOVIRAX) 5 %    Sig:   . DISCONTD: acyclovir (ZOVIRAX) 400 MG tablet    Sig: TK 1 T PO TID    Refill:  0  . acyclovir (ZOVIRAX) 400 MG tablet    Sig: TK 1 T PO TID    Dispense:  30 tablet    Refill:  2  . PARoxetine (PAXIL) 30 MG tablet    Sig: Take 1 tablet (30 mg total) by mouth daily.    Dispense:  90 tablet    Refill:  3  . ALPRAZolam (XANAX) 0.25 MG tablet    Sig: Take 1 tablet (0.25 mg total) by mouth 3 (three) times daily as needed for anxiety or sleep.    Dispense:  30 tablet    Refill:  0    [phone]  . amphetamine-dextroamphetamine (ADDERALL XR) 10 MG 24 hr capsule    Sig: Take 1 capsule (10 mg total) by mouth daily.    Dispense:  30 capsule    Refill:  0  . amphetamine-dextroamphetamine (ADDERALL XR) 10 MG 24 hr capsule    Sig: Take 1 capsule (10 mg total) by mouth daily.    Dispense:  30 capsule    Refill:  0    Do not fill until December  . amphetamine-dextroamphetamine (ADDERALL XR) 10 MG 24 hr capsule    Sig: Take 1 capsule (10 mg total) by mouth daily.    Dispense:  30 capsule    Refill:  0    Do not fill until January    Problem List Items Addressed This Visit    Depression with anxiety    paxil 30 mg  con't xanax con't counseling      Relevant Medications   ALPRAZolam (XANAX) 0.25 MG tablet   ADD (attention deficit disorder)   Relevant Medications   amphetamine-dextroamphetamine (ADDERALL XR) 10 MG 24 hr capsule   amphetamine-dextroamphetamine (ADDERALL XR) 10 MG 24 hr capsule   amphetamine-dextroamphetamine (ADDERALL XR) 10 MG 24 hr capsule    Other Visit Diagnoses    Herpes zoster    -   Primary    Relevant Medications    acyclovir ointment (ZOVIRAX) 5 %    acyclovir (ZOVIRAX) 400 MG tablet    Generalized anxiety disorder        Relevant Medications    PARoxetine (PAXIL) 30 MG tablet       Follow-up: Return in about 6 months (around 04/26/2016), or if symptoms worsen or fail to improve.  Garnet Koyanagi, DO

## 2015-10-28 NOTE — Progress Notes (Signed)
Pre visit review using our clinic review tool, if applicable. No additional management support is needed unless otherwise documented below in the visit note. 

## 2015-10-28 NOTE — Assessment & Plan Note (Signed)
paxil 30 mg  con't xanax con't counseling

## 2015-10-28 NOTE — Patient Instructions (Signed)

## 2015-11-01 ENCOUNTER — Ambulatory Visit: Payer: Self-pay | Admitting: Family Medicine

## 2015-11-18 ENCOUNTER — Encounter: Payer: Self-pay | Admitting: Medical

## 2015-11-18 ENCOUNTER — Ambulatory Visit (INDEPENDENT_AMBULATORY_CARE_PROVIDER_SITE_OTHER): Payer: 59 | Admitting: Medical

## 2015-11-18 VITALS — BP 124/86 | HR 78 | Temp 98.1°F | Ht 63.0 in | Wt 202.0 lb

## 2015-11-18 DIAGNOSIS — R739 Hyperglycemia, unspecified: Secondary | ICD-10-CM | POA: Diagnosis not present

## 2015-11-18 DIAGNOSIS — K0510 Chronic gingivitis, plaque induced: Secondary | ICD-10-CM | POA: Diagnosis not present

## 2015-11-18 DIAGNOSIS — B370 Candidal stomatitis: Secondary | ICD-10-CM

## 2015-11-18 MED ORDER — MAGIC MOUTHWASH: ORAL | Status: DC

## 2015-11-18 MED ORDER — FLUCONAZOLE 150 MG PO TABS: ORAL_TABLET | ORAL | Status: DC

## 2015-11-18 NOTE — Progress Notes (Signed)
Pre visit review using our clinic review tool, if applicable. No additional management support is needed unless otherwise documented below in the visit note. 

## 2015-11-18 NOTE — Patient Instructions (Signed)
For your thrush rx diflucan.  For your gingivitis and inflammation rx magic mouthwash.   I think peroxide gargles caused gingivitis and soreness under tongue. No mouth lesions present. If you notice any then let us recheck.  I do want to get a1-c to see if sugar averages are running high. Since this could be cause for thrush.  Follow up 2 wks or as needed

## 2015-11-18 NOTE — Progress Notes (Signed)
Subjective:    Patient ID: Martha Hampton, female    DOB: 07-28-1966, 49 y.o.   MRN: [id number]  HPI   Pt in with white coating to her tongue recently since last week . Pt states she gargled with vinegar and peroxide over thanksgiving holiday since we were closed. The  gargles  helped some but now under her tongue feels mild inflamed and givngiva feels inflamed. Pt is using acyclovir but she is not taking antibiotics.  Pt does get occasional thrush.   Pt used to be diabetic before her gastric bypass surgery in 2007.   Very mild sugar elevation 7 months ago.   Review of Systems  Constitutional: Negative for fever, chills and fatigue.  HENT: Negative for congestion.        Sore mouth. Recent white film to on tongue.  Respiratory: Negative for cough, choking, shortness of breath and wheezing.   Cardiovascular: Negative for chest pain and palpitations.  Neurological: Negative for dizziness, seizures, weakness, light-headedness and headaches.  Hematological: Negative for adenopathy. Does not bruise/bleed easily.  Psychiatric/Behavioral: Negative for behavioral problems.    Past Medical History  Diagnosis Date  . Anxiety   . Hyperlipidemia   . Anemia     iron deficiency  . GERD (gastroesophageal reflux disease)   . Genital herpes     Social History   Social History  . Marital Status: Married    Spouse Name: N/A  . Number of Children: N/A  . Years of Education: N/A   Occupational History  . Not on file.   Social History Main Topics  . Smoking status: Never Smoker   . Smokeless tobacco: Never Used  . Alcohol Use: No  . Drug Use: No  . Sexual Activity: Not on file   Other Topics Concern  . Not on file   Social History Narrative    Past Surgical History  Procedure Laterality Date  . 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 Cholecystectomy  11-2005  . Ankle surgery  1986    left  . Colonoscopy  01/06/2013    Procedure: COLONOSCOPY;  Surgeon: Beryle Beams, MD;  Location:  [facility];  Service: Gastroenterology;  Laterality: N/A;    Family History  Problem Relation Age of Onset  . Hypertension Mother   . Hypertension Father   . Cancer Mother     non-dodgkins mantel cell  . Rectal cancer Maternal Grandmother   . Breast cancer Maternal Aunt     Allergies  Allergen Reactions  . Anesthetics, Amide     Pt has malignant hyperthermia   . Penicillins     REACTION: RASH    Current Outpatient Prescriptions on File Prior to Visit  Medication Sig Dispense Refill  . acyclovir (ZOVIRAX) 400 MG tablet TK 1 T PO TID 30 tablet 2  . acyclovir ointment (ZOVIRAX) 5 %     . ALPRAZolam (XANAX) 0.25 MG tablet Take 1 tablet (0.25 mg total) by mouth 3 (three) times daily as needed for anxiety or sleep. 30 tablet 0  . amphetamine-dextroamphetamine (ADDERALL XR) 10 MG 24 hr capsule Take 1 capsule (10 mg total) by mouth daily. 30 capsule 0  . amphetamine-dextroamphetamine (ADDERALL XR) 10 MG 24 hr capsule Take 1 capsule (10 mg total) by mouth daily. 30 capsule 0  . amphetamine-dextroamphetamine (ADDERALL XR) 10 MG 24 hr capsule Take 1 capsule (10 mg total) by mouth daily. 30 capsule 0  . B Complex Vitamins (B COMPLEX 1 PO) Take by mouth daily.      Marland Kitchen  cetirizine (ZYRTEC) 10 MG tablet Take 10 mg by mouth daily.      Marland Kitchen PARoxetine (PAXIL) 20 MG tablet TAKE 1 TABLET BY MOUTH EVERY DAY 30 tablet 5  . PARoxetine (PAXIL) 30 MG tablet Take 1 tablet (30 mg total) by mouth daily. 90 tablet 3  . Vitamin D, Ergocalciferol, (DRISDOL) 50000 UNITS CAPS Take 50,000 Units by mouth every 7 (seven) days.       No current facility-administered medications on file prior to visit.    BP 124/86 mmHg  Pulse 78  Temp(Src) 98.1 F (36.7 C) (Oral)  Ht 5\' 3"  (1.6 m)  Wt 202 lb (91.627 kg)  BMI 35.79 kg/m2  SpO2 98%       Objective:   Physical Exam  General  Mental Status - Alert. General Appearance - Well groomed. Not in acute distress.  Skin Rashes- No Rashes.  HEENT Head-  Normal. Ear Auditory Canal - Left- Normal. Right - Normal.Tympanic Membrane- Left- Normal. Right- Normal. Eye Sclera/Conjunctiva- Left- Normal. Right- Normal. Nose & Sinuses Nasal Mucosa- Left-  Not oggy or Congested. Right-  Not  boggy or Congested. Mouth & Throat Lips: Upper Lip- Normal: no dryness, cracking, pallor, cyanosis, or vesicular eruption. Lower Lip-Normal: no dryness, cracking, pallor, cyanosis or vesicular eruption. Buccal Mucosa- Bilateral- No Aphthous ulcers. Oropharynx- pt tongue has faint white film. Under tongue floor of mouth mild bright red raw appearance. Gingiva looks slight enflamed. Tonsils: Characteristics- Bilateral- No Erythema or Congestion. Size/Enlargement- Bilateral- No enlargement. Discharge- bilateral-None.  Neck Neck- Supple. No Masses.   Chest and Lung Exam Auscultation: Breath Sounds:- even and unlabored, but bilateral upper lobe rhonchi.  Cardiovascular Auscultation:Rythm- Regular, rate and rhythm. Murmurs & Other Heart Sounds:Ausculatation of the heart reveal- No Murmurs.  Lymphatic Head & Neck General Head & Neck Lymphatics: Bilateral: Description- No Localized lymphadenopathy.       Assessment & Plan:  For your thrush rx diflucan.  For your gingivitis and inflammation rx magic mouthwash.   I think peroxide gargles caused gingivitis and soreness under tongue. No mouth lesions present. If you notice any then let us recheck.  I do want to get a1-c to see if sugar averages are running high. Since this could be cause for thrush.  Follow up 2 wks or as needed

## 2015-11-19 LAB — HEMOGLOBIN A1C: HEMOGLOBIN A1C: 5.8 % (ref 4.6–6.5)

## 2015-11-25 MED ORDER — AZITHROMYCIN 250 MG PO TABS: 250.0000 mg | ORAL_TABLET | Freq: Every day | ORAL | Status: DC

## 2015-11-25 NOTE — Addendum Note (Signed)
Addended by: Tasia Catchings on: 11/25/2015 09:36 AM   Modules accepted: Orders, Medications

## 2016-02-17 ENCOUNTER — Telehealth: Payer: Self-pay | Admitting: Family Medicine

## 2016-02-17 NOTE — Telephone Encounter (Signed)
Documented.   KP 

## 2016-02-17 NOTE — Telephone Encounter (Signed)
Pt declined flu shot. Afraid to take it and get sick.

## 2016-04-17 ENCOUNTER — Other Ambulatory Visit: Payer: Self-pay

## 2016-05-29 ENCOUNTER — Other Ambulatory Visit: Payer: Self-pay

## 2016-06-15 ENCOUNTER — Ambulatory Visit (INDEPENDENT_AMBULATORY_CARE_PROVIDER_SITE_OTHER): Payer: 59 | Admitting: Medical

## 2016-06-15 ENCOUNTER — Encounter: Payer: Self-pay | Admitting: Medical

## 2016-06-15 VITALS — BP 135/73 | HR 71 | Temp 98.7°F | Ht 63.0 in | Wt 215.0 lb

## 2016-06-15 DIAGNOSIS — R059 Cough, unspecified: Secondary | ICD-10-CM

## 2016-06-15 DIAGNOSIS — J3489 Other specified disorders of nose and nasal sinuses: Secondary | ICD-10-CM

## 2016-06-15 DIAGNOSIS — J209 Acute bronchitis, unspecified: Secondary | ICD-10-CM | POA: Diagnosis not present

## 2016-06-15 DIAGNOSIS — R05 Cough: Secondary | ICD-10-CM

## 2016-06-15 MED ORDER — BENZONATATE 100 MG PO CAPS: 100.0000 mg | ORAL_CAPSULE | Freq: Three times a day (TID) | ORAL | Status: DC | PRN

## 2016-06-15 MED ORDER — SULFAMETHOXAZOLE-TRIMETHOPRIM 800-160 MG PO TABS: 1.0000 | ORAL_TABLET | Freq: Two times a day (BID) | ORAL | Status: DC

## 2016-06-15 NOTE — Progress Notes (Signed)
Subjective:    Patient ID: Martha Hampton, female    DOB: July 26, 1966, 50 y.o.   MRN: [id number]  HPI  Pt has pnd for one week with some sinus pressure and nasal congestion. She thought was just allergies. She got flonase and sudafed otc but symptoms worsened. Then yesterday productive cough and chest congestion.  Pt not a smoker.  Also faint rt side nares mild sore.  She is heading out of town on Thursday.     Review of Systems  Constitutional: Negative for fever, chills and fatigue.  HENT: Positive for congestion, postnasal drip and sinus pressure. Negative for rhinorrhea and sore throat.   Respiratory: Positive for cough. Negative for chest tightness and wheezing.        Chest congestion  Cardiovascular: Negative for chest pain and palpitations.  Gastrointestinal: Negative for abdominal pain.  Musculoskeletal: Negative for back pain.  Skin: Negative for rash.  Neurological: Negative for dizziness, facial asymmetry, speech difficulty, weakness, light-headedness and headaches.  Hematological: Negative for adenopathy. Does not bruise/bleed easily.  Psychiatric/Behavioral: Negative for behavioral problems, confusion and sleep disturbance. The patient is not nervous/anxious.     Past Medical History  Diagnosis Date  . Anxiety   . Hyperlipidemia   . Anemia     iron deficiency  . GERD (gastroesophageal reflux disease)   . Genital herpes      Social History   Social History  . Marital Status: Married    Spouse Name: N/A  . Number of Children: N/A  . Years of Education: N/A   Occupational History  . Not on file.   Social History Main Topics  . Smoking status: Never Smoker   . Smokeless tobacco: Never Used  . Alcohol Use: No  . Drug Use: No  . Sexual Activity: Not on file   Other Topics Concern  . Not on file   Social History Narrative    Past Surgical History  Procedure Laterality Date  . 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 Cholecystectomy  11-2005  . Ankle  surgery  1986    left  . Colonoscopy  01/06/2013    Procedure: COLONOSCOPY;  Surgeon: Beryle Beams, MD;  Location: [facility];  Service: Gastroenterology;  Laterality: N/A;    Family History  Problem Relation Age of Onset  . Hypertension Mother   . Hypertension Father   . Cancer Mother     non-dodgkins mantel cell  . Rectal cancer Maternal Grandmother   . Breast cancer Maternal Aunt     Allergies  Allergen Reactions  . Anesthetics, Amide     Pt has malignant hyperthermia   . Penicillins     REACTION: RASH    Current Outpatient Prescriptions on File Prior to Visit  Medication Sig Dispense Refill  . acyclovir (ZOVIRAX) 400 MG tablet TK 1 T PO TID 30 tablet 2  . acyclovir ointment (ZOVIRAX) 5 %     . amphetamine-dextroamphetamine (ADDERALL XR) 10 MG 24 hr capsule Take 1 capsule (10 mg total) by mouth daily. 30 capsule 0  . B Complex Vitamins (B COMPLEX 1 PO) Take by mouth daily.      . cetirizine (ZYRTEC) 10 MG tablet Take 10 mg by mouth daily.      Marland Kitchen PARoxetine (PAXIL) 20 MG tablet TAKE 1 TABLET BY MOUTH EVERY DAY 30 tablet 5  . PARoxetine (PAXIL) 30 MG tablet Take 1 tablet (30 mg total) by mouth daily. 90 tablet 3  . ALPRAZolam (XANAX) 0.25 MG tablet  Take 1 tablet (0.25 mg total) by mouth 3 (three) times daily as needed for anxiety or sleep. (Patient not taking: Reported on 06/15/2016) 30 tablet 0  . fluconazole (DIFLUCAN) 150 MG tablet 1 tab po. Repeat in 3 days if needed (Patient not taking: Reported on 06/15/2016) 2 tablet 0  . Vitamin D, Ergocalciferol, (DRISDOL) 50000 UNITS CAPS Take 50,000 Units by mouth every 7 (seven) days. Reported on 06/15/2016     No current facility-administered medications on file prior to visit.    BP 135/73 mmHg  Pulse 71  Temp(Src) 98.7 F (37.1 C) (Oral)  Ht 5\' 3"  (1.6 m)  Wt 215 lb (97.523 kg)  BMI 38.09 kg/m2  SpO2 100%       Objective:   Physical Exam  General  Mental Status - Alert. General Appearance - Well groomed. Not in  acute distress.  Skin Rashes- No Rashes.  HEENT Head- Normal. Ear Auditory Canal - Left- Normal. Right - Normal.Tympanic Membrane- Left- Normal. Right- Normal. Eye Sclera/Conjunctiva- Left- Normal. Right- Normal. Nose & Sinuses Nasal Mucosa- Left-  Boggy and Congested. Right-  Boggy and  Congested. Bilateral  No maxillary and no  frontal sinus pressure. Rt nares- mild tender at entrance. No obvious lesion. Mouth & Throat Lips: Upper Lip- Normal: no dryness, cracking, pallor, cyanosis, or vesicular eruption. Lower Lip-Normal: no dryness, cracking, pallor, cyanosis or vesicular eruption. Buccal Mucosa- Bilateral- No Aphthous ulcers. Oropharynx- No Discharge or Erythema. Tonsils: Characteristics- Bilateral- No Erythema or Congestion. Size/Enlargement- Bilateral- No enlargement. Discharge- bilateral-None.  Neck Neck- Supple. No Masses.   Chest and Lung Exam Auscultation: Breath Sounds:-Clear even and unlabored.  Cardiovascular Auscultation:Rythm- Regular, rate and rhythm. Murmurs & Other Heart Sounds:Ausculatation of the heart reveal- No Murmurs.  Lymphatic Head & Neck General Head & Neck Lymphatics: Bilateral: Description- No Localized lymphadenopathy.        Assessment & Plan:  Your symptoms appear to  have started as allergies and now having some bronchitis type symptoms.  Will rx bactrim DS antibiotic. For cough rx benzonatate. Continue your flonase.  For area on rt nares please show this to the dermatologist next Wed.  Follow up in 7-10 days or as needed    Dayveon Halley, Percell Miller, [employer]

## 2016-06-15 NOTE — Patient Instructions (Addendum)
Your symptoms appear to  have started as allergies and now having some bronchitis type symptoms.  Will rx bactrim DS antibiotic. For cough rx benzonatate. Continue your flonase.  For area on rt nares please show this to the dermatologist next Wed.  Follow up in 7-10 days or as needed

## 2016-06-22 ENCOUNTER — Telehealth: Payer: Self-pay | Admitting: Medical

## 2016-06-22 ENCOUNTER — Telehealth: Payer: Self-pay | Admitting: Family Medicine

## 2016-06-22 MED ORDER — SULFAMETHOXAZOLE-TRIMETHOPRIM 800-160 MG PO TABS: 1.0000 | ORAL_TABLET | Freq: Two times a day (BID) | ORAL | Status: DC

## 2016-06-22 NOTE — Telephone Encounter (Signed)
Relation to [id number] Call back [place]: [facility] [address] [place], [place] [address] [phone]   Reason for call:  Patient states symptoms have improved slightly. Patient experiencing coughing and would like a refill sulfamethoxazole-trimethoprim (BACTRIM DS,SEPTRA DS) 800-160 MG tablet. Patient is on vacation and would like to be notified when Rx is sent to pharmacy.

## 2016-06-22 NOTE — Telephone Encounter (Signed)
Rx sent in at pt request.

## 2016-06-22 NOTE — Telephone Encounter (Signed)
Antibiotic sent in at pt request.

## 2016-07-13 ENCOUNTER — Other Ambulatory Visit: Payer: Self-pay | Admitting: Family Medicine

## 2016-07-13 DIAGNOSIS — F988 Other specified behavioral and emotional disorders with onset usually occurring in childhood and adolescence: Secondary | ICD-10-CM

## 2016-07-13 NOTE — Telephone Encounter (Signed)
Scheduled patient for 8/7 @ 4:15. Patient wants to know if she can pick up her Rx today.

## 2016-07-13 NOTE — Telephone Encounter (Signed)
°  Relationship to patient: Self Can be reached: [phone]   Reason for call: Refill amphetamine-dextroamphetamine (ADDERALL XR) 10 MG 24 hr capsule

## 2016-07-13 NOTE — Telephone Encounter (Signed)
She is overdue for an OV with PCP, please schedule.    KP

## 2016-07-14 MED ORDER — AMPHETAMINE-DEXTROAMPHET ER 10 MG PO CP24: 10.0000 mg | ORAL_CAPSULE | Freq: Every day | ORAL | 0 refills | Status: DC

## 2016-07-14 NOTE — Telephone Encounter (Signed)
Last seen and filled 10/28/15 #30  UDS 03/14/15 low risk  Please advise    KP

## 2016-07-16 NOTE — Telephone Encounter (Addendum)
VM left advising Rx ready for pick up.     KP 

## 2016-07-27 ENCOUNTER — Ambulatory Visit: Payer: Self-pay | Admitting: Family Medicine

## 2016-08-03 ENCOUNTER — Encounter: Payer: Self-pay | Admitting: Family Medicine

## 2016-08-03 ENCOUNTER — Ambulatory Visit (INDEPENDENT_AMBULATORY_CARE_PROVIDER_SITE_OTHER): Payer: 59 | Admitting: Family Medicine

## 2016-08-03 VITALS — BP 122/80 | HR 82 | Temp 98.0°F | Wt 218.2 lb

## 2016-08-03 DIAGNOSIS — F419 Anxiety disorder, unspecified: Secondary | ICD-10-CM

## 2016-08-03 DIAGNOSIS — F908 Attention-deficit hyperactivity disorder, other type: Secondary | ICD-10-CM

## 2016-08-03 MED ORDER — AMPHETAMINE-DEXTROAMPHET ER 10 MG PO CP24: 10.0000 mg | ORAL_CAPSULE | Freq: Every day | ORAL | 0 refills | Status: DC

## 2016-08-03 MED ORDER — PAROXETINE HCL 30 MG PO TABS: 30.0000 mg | ORAL_TABLET | Freq: Every day | ORAL | 5 refills | Status: DC

## 2016-08-03 NOTE — Patient Instructions (Signed)

## 2016-08-03 NOTE — Progress Notes (Signed)
Pre visit review using our clinic review tool, if applicable. No additional management support is needed unless otherwise documented below in the visit note. 

## 2016-08-03 NOTE — Progress Notes (Signed)
Patient ID: Martha Hampton, female    DOB: 11-28-1966  Age: 50 y.o. MRN: [id number]    Subjective:  Subjective  HPI Martha Hampton presents for f/u add and anxiety.  She needs refills.  Her anxiety is getting worse --- she had tried to decrease the dose but that is not working.    Review of Systems  Constitutional: Negative for appetite change, diaphoresis, fatigue and unexpected weight change.  Eyes: Negative for pain, redness and visual disturbance.  Respiratory: Negative for cough, chest tightness, shortness of breath and wheezing.   Cardiovascular: Negative for chest pain, palpitations and leg swelling.  Endocrine: Negative for cold intolerance, heat intolerance, polydipsia, polyphagia and polyuria.  Genitourinary: Negative for difficulty urinating, dysuria and frequency.  Neurological: Negative for dizziness, light-headedness, numbness and headaches.    History Past Medical History:  Diagnosis Date  . Anemia    iron deficiency  . Anxiety   . Genital herpes   . GERD (gastroesophageal reflux disease)   . Hyperlipidemia     She has a past surgical history that includes Gastric bypass (07-04-07); Cholecystectomy (11-2005); Ankle surgery (1986); and Colonoscopy (01/06/2013).   Her family history includes Breast cancer in her maternal aunt; Cancer in her mother; Hypertension in her father and mother; Rectal cancer in her maternal grandmother.She reports that she has never smoked. She has never used smokeless tobacco. She reports that she does not drink alcohol or use drugs.  Current Outpatient Prescriptions on File Prior to Visit  Medication Sig Dispense Refill  . acyclovir (ZOVIRAX) 400 MG tablet TK 1 T PO TID 30 tablet 2  . acyclovir ointment (ZOVIRAX) 5 %     . amphetamine-dextroamphetamine (ADDERALL XR) 10 MG 24 hr capsule Take 1 capsule (10 mg total) by mouth daily. 30 capsule 0  . B Complex Vitamins (B COMPLEX 1 PO) Take by mouth daily.      . cetirizine (ZYRTEC) 10 MG tablet  Take 10 mg by mouth daily.      Marland Kitchen sulfamethoxazole-trimethoprim (BACTRIM DS,SEPTRA DS) 800-160 MG tablet Take 1 tablet by mouth 2 (two) times daily. 14 tablet 0  . Vitamin D, Ergocalciferol, (DRISDOL) 50000 UNITS CAPS Take 50,000 Units by mouth every 7 (seven) days. Reported on 06/15/2016    . ALPRAZolam (XANAX) 0.25 MG tablet Take 1 tablet (0.25 mg total) by mouth 3 (three) times daily as needed for anxiety or sleep. (Patient not taking: Reported on 06/15/2016) 30 tablet 0  . benzonatate (TESSALON) 100 MG capsule Take 1 capsule (100 mg total) by mouth 3 (three) times daily as needed. (Patient not taking: Reported on 08/03/2016) 21 capsule 0   No current facility-administered medications on file prior to visit.      Objective:  Objective  Physical Exam  Constitutional: She is oriented to person, place, and time. She appears well-developed and well-nourished.  HENT:  Head: Normocephalic and atraumatic.  Eyes: Conjunctivae and EOM are normal.  Neck: Normal range of motion. Neck supple. No JVD present. Carotid bruit is not present. No thyromegaly present.  Cardiovascular: Normal rate, regular rhythm and normal heart sounds.   No murmur heard. Pulmonary/Chest: Effort normal and breath sounds normal. No respiratory distress. She has no wheezes. She has no rales. She exhibits no tenderness.  Musculoskeletal: She exhibits no edema.  Neurological: She is alert and oriented to person, place, and time.  Psychiatric: Her behavior is normal. Thought content normal. Her mood appears anxious. She exhibits a depressed mood.  Nursing note reviewed.  BP 122/80 (BP Location: Right Arm, Patient Position: Sitting, Cuff Size: Large)   Pulse 82   Temp 98 F (36.7 C) (Oral)   Wt 218 lb 3.2 oz (99 kg)   SpO2 98%   BMI 38.65 kg/m  Wt Readings from Last 3 Encounters:  08/03/16 218 lb 3.2 oz (99 kg)  06/15/16 215 lb (97.5 kg)  11/18/15 202 lb (91.6 kg)     Lab Results  Component Value Date   WBC 6.6  04/01/2015   HGB 10.5 (L) 04/01/2015   HCT 32.9 (L) 04/01/2015   PLT 365.0 04/01/2015   GLUCOSE 101 (H) 04/01/2015   CHOL 174 04/15/2009   TRIG 56.0 04/15/2009   HDL 47.20 04/15/2009   LDLCALC 116 (H) 04/15/2009   ALT 13 04/01/2015   AST 20 04/01/2015   NA 136 04/01/2015   K 3.7 04/01/2015   CL 101 04/01/2015   CREATININE 0.69 04/01/2015   BUN 10 04/01/2015   CO2 28 04/01/2015   TSH 0.64 04/20/2011   INR 1.0 04/01/2015   HGBA1C 5.8 11/18/2015    No results found.   Assessment & Plan:  Plan  I have discontinued Ms. Clapp's PARoxetine, PARoxetine, and fluconazole. I am also having her start on PARoxetine, amphetamine-dextroamphetamine, amphetamine-dextroamphetamine, and amphetamine-dextroamphetamine. Additionally, I am having her maintain her B Complex Vitamins (B COMPLEX 1 PO), Vitamin D (Ergocalciferol), cetirizine, acyclovir ointment, acyclovir, ALPRAZolam, benzonatate, sulfamethoxazole-trimethoprim, and amphetamine-dextroamphetamine.  Meds ordered this encounter  Medications  . PARoxetine (PAXIL) 30 MG tablet    Sig: Take 1 tablet (30 mg total) by mouth daily.    Dispense:  30 tablet    Refill:  5  . amphetamine-dextroamphetamine (ADDERALL XR) 10 MG 24 hr capsule    Sig: Take 1 capsule (10 mg total) by mouth daily.    Dispense:  30 capsule    Refill:  0  . amphetamine-dextroamphetamine (ADDERALL XR) 10 MG 24 hr capsule    Sig: Take 1 capsule (10 mg total) by mouth daily.    Dispense:  30 capsule    Refill:  0    Do not refill until September 14,2017  . amphetamine-dextroamphetamine (ADDERALL XR) 10 MG 24 hr capsule    Sig: Take 1 capsule (10 mg total) by mouth daily.    Dispense:  30 capsule    Refill:  0    Do not fill until October 03, 2016    Problem List Items Addressed This Visit    None    Visit Diagnoses    Anxiety    -  Primary   Relevant Medications   PARoxetine (PAXIL) 30 MG tablet   Attention-deficit hyperactivity disorder, other type        Relevant Medications   amphetamine-dextroamphetamine (ADDERALL XR) 10 MG 24 hr capsule   amphetamine-dextroamphetamine (ADDERALL XR) 10 MG 24 hr capsule   amphetamine-dextroamphetamine (ADDERALL XR) 10 MG 24 hr capsule                  rto 6 months  Follow-up: Return in about 6 months (around 02/03/2017) for add.   Ann Held, DO

## 2017-01-14 ENCOUNTER — Ambulatory Visit (HOSPITAL_BASED_OUTPATIENT_CLINIC_OR_DEPARTMENT_OTHER)
Admission: RE | Admit: 2017-01-14 | Discharge: 2017-01-14 | Disposition: A | Discharge status: Home / Self Care | Payer: 59 | Source: Ambulatory Visit | Attending: Medical | Admitting: Medical

## 2017-01-14 ENCOUNTER — Ambulatory Visit (INDEPENDENT_AMBULATORY_CARE_PROVIDER_SITE_OTHER): Payer: 59 | Admitting: Medical

## 2017-01-14 ENCOUNTER — Encounter: Payer: Self-pay | Admitting: Medical

## 2017-01-14 VITALS — BP 126/68 | HR 78 | Temp 98.6°F | Wt 222.6 lb

## 2017-01-14 DIAGNOSIS — M5441 Lumbago with sciatica, right side: Secondary | ICD-10-CM | POA: Insufficient documentation

## 2017-01-14 DIAGNOSIS — M5136 Other intervertebral disc degeneration, lumbar region: Secondary | ICD-10-CM | POA: Insufficient documentation

## 2017-01-14 DIAGNOSIS — M5442 Lumbago with sciatica, left side: Secondary | ICD-10-CM | POA: Insufficient documentation

## 2017-01-14 MED ORDER — CYCLOBENZAPRINE HCL 10 MG PO TABS: 10.0000 mg | ORAL_TABLET | Freq: Every day | ORAL | 0 refills | Status: DC

## 2017-01-14 MED ORDER — KETOROLAC TROMETHAMINE 60 MG/2ML IM SOLN
60.0000 mg | Freq: Once | INTRAMUSCULAR | Status: AC
  Administered 2017-01-14: 60 mg via INTRAMUSCULAR

## 2017-01-14 MED ORDER — HYDROCODONE-ACETAMINOPHEN 5-325 MG PO TABS: 1.0000 | ORAL_TABLET | Freq: Four times a day (QID) | ORAL | 0 refills | Status: DC | PRN

## 2017-01-14 NOTE — Progress Notes (Signed)
   Subjective:    Patient ID: Martha Hampton, female    DOB: 1966-10-06, 51 y.o.   MRN: [id number]  HPI  Pt in with some severe back pain since Nov 20, 2017. Lifting a computer. Pt states her employer denied her worker compensation. 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 She just got her denial letter for worker comp the other day.  Pt is in her lower lumbar area/sacral area. Pt has varying radiating pain to both lower ext intermittently. Pt can move legs. Left leg feels slight weakness climbing steps. No foot drop. No incontinence. No saddle anesthesia. Pain shoots from back to her feet on both sides intermittently.   After she moved 3 computers felt slight soreness. Then next day had severe pain. Since then has persistent severe pain.  Pt thinks codeine may have caused her heart to raise. Pt has used tussionex before and di dnot have any  reaction. Can take tylenol.(so appears should have no reaction to norco)   Review of Systems  Constitutional: Negative for chills, fatigue and fever.  Respiratory: Negative for cough, shortness of breath and wheezing.   Cardiovascular: Negative for chest pain and palpitations.  Musculoskeletal: Positive for back pain.  Skin: Negative for rash.  Neurological: Negative for dizziness, tremors, seizures and headaches.       See hpi.  Hematological: Negative for adenopathy. Does not bruise/bleed easily.  Psychiatric/Behavioral: Negative for behavioral problems, confusion, sleep disturbance and suicidal ideas.       Frustrated and crying about pain level and work comp denial.       Objective:   Physical Exam  General Appearance- Not in acute distress.    Chest and Lung Exam Auscultation: Breath sounds:-Normal. Clear even and unlabored. Adventitious sounds:- No Adventitious sounds.  Cardiovascular Auscultation:Rythm - Regular, rate and rythm. Heart Sounds -Normal heart sounds.  Abdomen Inspection:-Inspection  Normal.  Palpation/Perucssion: Palpation and Percussion of the abdomen reveal- Non Tender, No Rebound tenderness, No rigidity(Guarding) and No Palpable abdominal masses.  Liver:-Normal.  Spleen:- Normal.   Back Mid lumbar spine tenderness to palpation and bilateral si tenderness. No pain on straight leg lift. Pain on lateral movements and flexion/extension of the spine.  Lower ext neurologic  L5-S1 sensation intact bilaterally. Normal patellar reflexes bilaterally. No foot drop bilaterally.      Assessment & Plan:  For your back pain will give toradol 60 mg im.  Tomorrow night can start low dose ibuprofen.  Norco for severe pain.  Flexeril 10 mg at night.   If worse signs and symptom after hours as discussed then ED eval.  Follow up in 7 days or as needed.  Get lumbar xray tonight.  May need to consider PT or mri if symptoms don't improve.  Can start back exercises as tolerate if pain decreases a lot.  Placed xray order twice. Radiology could not seen. Pt came up for singed order. I signed but then pt left. Not sure why radiology could not see my orders?

## 2017-01-14 NOTE — Patient Instructions (Addendum)
For your back pain will give toradol 60 mg im.  Tomorrow night can start low dose ibuprofen.  Norco for severe pain.  Flexeril 10 mg at night.   If worse signs and symptom after hours as discussed then ED eval.  Follow up in 7 days or as needed.  Get lumbar xray tonight.  May need to consider PT or mri if symptoms don't improve.  Can start back exercises as tolerate if pain decreases a lot.   Back Exercises Introduction If you have pain in your back, do these exercises 2-3 times each day or as told by your doctor. When the pain goes away, do the exercises once each day, but repeat the steps more times for each exercise (do more repetitions). If you do not have pain in your back, do these exercises once each day or as told by your doctor. Exercises Single Knee to Chest  Do these steps 3-5 times in a row for each leg: 1. Lie on your back on a firm bed or the floor with your legs stretched out. 2. Bring one knee to your chest. 3. Hold your knee to your chest by grabbing your knee or thigh. 4. Pull on your knee until you feel a gentle stretch in your lower back. 5. Keep doing the stretch for 10-30 seconds. 6. Slowly let go of your leg and straighten it. Pelvic Tilt  Do these steps 5-10 times in a row: 1. Lie on your back on a firm bed or the floor with your legs stretched out. 2. Bend your knees so they point up to the ceiling. Your feet should be flat on the floor. 3. Tighten your lower belly (abdomen) muscles to press your lower back against the floor. This will make your tailbone point up to the ceiling instead of pointing down to your feet or the floor. 4. Stay in this position for 5-10 seconds while you gently tighten your muscles and breathe evenly. Cat-Cow  Do these steps until your lower back bends more easily: 1. Get on your hands and knees on a firm surface. Keep your hands under your shoulders, and keep your knees under your hips. You may put padding under your  knees. 2. Let your head hang down, and make your tailbone point down to the floor so your lower back is round like the back of a cat. 3. Stay in this position for 5 seconds. 4. Slowly lift your head and make your tailbone point up to the ceiling so your back hangs low (sags) like the back of a cow. 5. Stay in this position for 5 seconds. Press-Ups  Do these steps 5-10 times in a row: 1. Lie on your belly (face-down) on the floor. 2. Place your hands near your head, about shoulder-width apart. 3. While you keep your back relaxed and keep your hips on the floor, slowly straighten your arms to raise the top half of your body and lift your shoulders. Do not use your back muscles. To make yourself more comfortable, you may change where you place your hands. 4. Stay in this position for 5 seconds. 5. Slowly return to lying flat on the floor. Bridges  Do these steps 10 times in a row: 1. Lie on your back on a firm surface. 2. Bend your knees so they point up to the ceiling. Your feet should be flat on the floor. 3. Tighten your butt muscles and lift your butt off of the floor until your waist is almost  as high as your knees. If you do not feel the muscles working in your butt and the back of your thighs, slide your feet 1-2 inches farther away from your butt. 4. Stay in this position for 3-5 seconds. 5. Slowly lower your butt to the floor, and let your butt muscles relax. If this exercise is too easy, try doing it with your arms crossed over your chest. Belly Crunches  Do these steps 5-10 times in a row: 1. Lie on your back on a firm bed or the floor with your legs stretched out. 2. Bend your knees so they point up to the ceiling. Your feet should be flat on the floor. 3. Cross your arms over your chest. 4. Tip your chin a little bit toward your chest but do not bend your neck. 5. Tighten your belly muscles and slowly raise your chest just enough to lift your shoulder blades a tiny bit off of the  floor. 6. Slowly lower your chest and your head to the floor. Back Lifts  Do these steps 5-10 times in a row: 1. Lie on your belly (face-down) with your arms at your sides, and rest your forehead on the floor. 2. Tighten the muscles in your legs and your butt. 3. Slowly lift your chest off of the floor while you keep your hips on the floor. Keep the back of your head in line with the curve in your back. Look at the floor while you do this. 4. Stay in this position for 3-5 seconds. 5. Slowly lower your chest and your face to the floor. Contact a doctor if:  Your back pain gets a lot worse when you do an exercise.  Your back pain does not lessen 2 hours after you exercise. If you have any of these problems, stop doing the exercises. Do not do them again unless your doctor says it is okay. Get help right away if:  You have sudden, very bad back pain. If this happens, stop doing the exercises. Do not do them again unless your doctor says it is okay. This information is not intended to replace advice given to you by your health care provider. Make sure you discuss any questions you have with your health care provider. Document Released: 01/09/2011 Document Revised: 05/14/2016 Document Reviewed: 01/31/2015  2017 Elsevier

## 2017-01-14 NOTE — Progress Notes (Signed)
Pre visit review using our clinic review tool, if applicable. No additional management support is needed unless otherwise documented below in the visit note. 

## 2017-01-15 ENCOUNTER — Telehealth: Payer: Self-pay | Admitting: Family Medicine

## 2017-01-15 NOTE — Telephone Encounter (Signed)
Relation to [id number] Call back [place]:  Reason for call:  Patient inquiring about imaging results

## 2017-01-18 ENCOUNTER — Telehealth: Payer: Self-pay | Admitting: Medical

## 2017-01-18 DIAGNOSIS — M5440 Lumbago with sciatica, unspecified side: Secondary | ICD-10-CM

## 2017-01-18 NOTE — Telephone Encounter (Signed)
Patient called to follow up on Imaging she had last week. Very upset that no one has called her and she states she called 3 times on Friday. Plse adv

## 2017-01-18 NOTE — Telephone Encounter (Signed)
Referral to sports med placed.

## 2017-01-18 NOTE — Telephone Encounter (Signed)
Copy & Pasted in to Result note.01/29

## 2017-01-20 ENCOUNTER — Telehealth: Payer: Self-pay | Admitting: Medical

## 2017-01-20 NOTE — Telephone Encounter (Signed)
Lm on vm to make patient aware referral has been faxed

## 2017-01-20 NOTE — Telephone Encounter (Signed)
Patient called to follow up on this referral change. Martha Hampton asked to make sure it was known that Martha Hampton wants to see Dr. Lynann Bologna at [facility]

## 2017-01-20 NOTE — Telephone Encounter (Signed)
Can do [facility]

## 2017-01-21 ENCOUNTER — Ambulatory Visit: Payer: Self-pay | Admitting: Family Medicine

## 2017-01-27 ENCOUNTER — Other Ambulatory Visit: Payer: Self-pay | Admitting: Orthopedic Surgery

## 2017-01-27 DIAGNOSIS — M5416 Radiculopathy, lumbar region: Secondary | ICD-10-CM

## 2017-01-29 ENCOUNTER — Ambulatory Visit
Admission: RE | Admit: 2017-01-29 | Discharge: 2017-01-29 | Disposition: A | Discharge status: Home / Self Care | Payer: 59 | Source: Ambulatory Visit | Attending: Orthopedic Surgery | Admitting: Orthopedic Surgery

## 2017-01-29 DIAGNOSIS — M5416 Radiculopathy, lumbar region: Secondary | ICD-10-CM

## 2017-03-22 ENCOUNTER — Other Ambulatory Visit: Payer: Self-pay | Admitting: Family Medicine

## 2017-03-22 DIAGNOSIS — F419 Anxiety disorder, unspecified: Secondary | ICD-10-CM

## 2017-03-22 MED ORDER — PAROXETINE HCL 30 MG PO TABS: 30.0000 mg | ORAL_TABLET | Freq: Every day | ORAL | 2 refills | Status: DC

## 2017-07-26 ENCOUNTER — Ambulatory Visit (INDEPENDENT_AMBULATORY_CARE_PROVIDER_SITE_OTHER): Payer: 59 | Admitting: Family Medicine

## 2017-07-26 VITALS — BP 126/88 | HR 88 | Temp 98.7°F | Ht 63.0 in | Wt 220.4 lb

## 2017-07-26 DIAGNOSIS — F418 Other specified anxiety disorders: Secondary | ICD-10-CM | POA: Diagnosis not present

## 2017-07-26 MED ORDER — HYDROXYZINE HCL 50 MG PO TABS: 50.0000 mg | ORAL_TABLET | Freq: Three times a day (TID) | ORAL | 1 refills | Status: DC | PRN

## 2017-07-26 NOTE — Patient Instructions (Addendum)
Please consider counseling. Contact [phone] to schedule an appointment or inquire about cost/insurance coverage.  Please call before 4 weeks if you feel that you need to be seen sooner.   Try taking medicine at night or at home on an off day to see if it makes you drowsy. You can also see how taking 1/2 a tab works.  Let us know if you need anything.

## 2017-07-26 NOTE — Progress Notes (Signed)
Chief Complaint  Patient presents with  . Anxiety    Patient C/O problem with anxiety.  Left work on Friday in a full blown panic attack.    Subjective Martha Hampton presents for f/u anxiety/depression.  Over the past 9 mo, she has had a new boss and her anxiety as significantly worsened. She was stable on Paxil 30 mg daily. She does not follow with a counselor or psychologist. She had a panic attack on Friday after leaving the office.  No thoughts of harming self or others. No self-medication with alcohol, prescription drugs or illicit drugs.  ROS Psych: No homicidal or suicidal thoughts  Past Medical History:  Diagnosis Date  . Anemia    iron deficiency  . Anxiety   . Genital herpes   . GERD (gastroesophageal reflux disease)   . Hyperlipidemia    Family History  Problem Relation Age of Onset  . Hypertension Mother   . Hypertension Father   . Cancer Mother        non-dodgkins mantel cell  . Rectal cancer Maternal Grandmother   . Breast cancer Maternal Aunt    Allergies as of 07/26/2017      Reactions   Anesthetics, Amide    Pt has malignant hyperthermia   Penicillins    REACTION: RASH      Medication List       Accurate as of 07/26/17  1:54 PM. Always use your most recent med list.          acyclovir 400 MG tablet Commonly known as:  ZOVIRAX TK 1 T PO TID   B COMPLEX 1 PO Take by mouth daily.   cetirizine 10 MG tablet Commonly known as:  ZYRTEC Take 10 mg by mouth daily.   hydrOXYzine 50 MG tablet Commonly known as:  ATARAX/VISTARIL Take 1 tablet (50 mg total) by mouth 3 (three) times daily as needed for anxiety.   PARoxetine 30 MG tablet Commonly known as:  PAXIL Take 1 tablet (30 mg total) by mouth daily.   Vitamin D (Ergocalciferol) 50000 units Caps capsule Commonly known as:  DRISDOL Take 50,000 Units by mouth every 7 (seven) days. Reported on 06/15/2016       Exam BP 126/88 (BP Location: Right Arm, Patient Position: Sitting, Cuff  Size: Large)   Pulse 88   Temp 98.7 F (37.1 C) (Oral)   Ht 5\' 3"  (1.6 m)   Wt 220 lb 6.4 oz (100 kg)   SpO2 98%   BMI 39.04 kg/m  General:  well developed, well nourished, in no apparent distress Lungs:  no respiratory distress Psych: well oriented with normal range of affect and age-appropriate judgement/insight, alert and oriented x4.  Assessment and Plan  Depression with anxiety - Plan: hydrOXYzine (ATARAX/VISTARIL) 50 MG tablet  Orders as above. Pt requested non-addictive option to take right when she feels high anxiety/panic. Will try hydroxyzine, try at home first to see if it makes her drowsy. Number for counseling given. Counseled on diet and exercise affecting mood. F/u in 4 weeks with Dr. Etter Sjogren. The patient voiced understanding and agreement to the plan.  Greater than 15 minutes were spent face to face with the patient with greater than 50% of this time spent counseling on management options for anxiety/depression and prognosis.    [place], DO 07/26/17 1:54 PM

## 2017-08-09 ENCOUNTER — Encounter: Payer: Self-pay | Admitting: Family Medicine

## 2017-08-09 ENCOUNTER — Ambulatory Visit (INDEPENDENT_AMBULATORY_CARE_PROVIDER_SITE_OTHER): Payer: 59 | Admitting: Family Medicine

## 2017-08-09 VITALS — BP 136/86 | HR 81

## 2017-08-09 DIAGNOSIS — F332 Major depressive disorder, recurrent severe without psychotic features: Secondary | ICD-10-CM

## 2017-08-09 DIAGNOSIS — F429 Obsessive-compulsive disorder, unspecified: Secondary | ICD-10-CM | POA: Diagnosis not present

## 2017-08-09 DIAGNOSIS — D508 Other iron deficiency anemias: Secondary | ICD-10-CM

## 2017-08-09 DIAGNOSIS — B029 Zoster without complications: Secondary | ICD-10-CM

## 2017-08-09 LAB — POC URINALSYSI DIPSTICK (AUTOMATED)
Bilirubin, UA: NEGATIVE
Blood, UA: NEGATIVE
GLUCOSE UA: NEGATIVE
Ketones, UA: NEGATIVE
LEUKOCYTES UA: NEGATIVE
NITRITE UA: NEGATIVE
Protein, UA: NEGATIVE
Urobilinogen, UA: 0.2 E.U./dL
pH, UA: 6 (ref 5.0–8.0)

## 2017-08-09 MED ORDER — PAROXETINE HCL 40 MG PO TABS: 40.0000 mg | ORAL_TABLET | ORAL | 1 refills | Status: DC

## 2017-08-09 MED ORDER — ACYCLOVIR 5 % EX CREA: 1.0000 "application " | TOPICAL_CREAM | CUTANEOUS | 0 refills | Status: DC

## 2017-08-09 MED ORDER — ACYCLOVIR 400 MG PO TABS: ORAL_TABLET | ORAL | 2 refills | Status: DC

## 2017-08-09 NOTE — Patient Instructions (Signed)
Obsessive-Compulsive Disorder Obsessive-compulsive disorder (OCD) is a brain-based anxiety disorder. People with OCD have obsessions, compulsions, or both. Obsessions are unwanted and distressing thoughts, ideas, or urges that keep entering your mind and result in anxiety. You may find yourself trying to ignore them. You may try to stop or undo them with a compulsion. Compulsions are repetitive physical or mental acts that you feel you have to do. They may reduce or prevent any emotional distress, but in most instances, they are ineffective. Compulsions can be very time-consuming, often taking more than one hour each day. They can interfere with personal relationships and normal activities at home, school, or work. OCD can begin in childhood, but it usually starts in young adulthood and continues throughout life. Many people with OCD also have depression or another mental health disorder. What are the causes? The cause of this condition is not known. What increases the risk? This condition is more like to develop in:  People who have experienced trauma.  People who have a family history of OCD.  Women during and after pregnancy.  People who have infections and post-infectious autoimmune syndrome.  People who have other mental health conditions.  People who abuse substances.  What are the signs or symptoms? Symptoms of OCD include compulsions and obsessions. People with obsessions usually have a fear that something terrible will happen or that they will do something terrible. Examples of common obsessions include:  Fear of contamination with germs, waste, or poisonous substances.  Fear of making the wrong decision.  Violent or sexual thoughts or urges towards others.  Need for symmetry or exactness.  Examples of common compulsions include:  Excessive handwashing or bathing due to fear of contamination.  Checking things over and over to make sure you finished a task, such as making  sure you locked a door or unplugged a toaster.  Repeating an act or phrase over and over, sometimes a specific number of times, until it feels right.  Arranging and rearranging objects to keep them in a certain order.  Having a very hard time making a decision and sticking to it.  How is this diagnosed? OCD is diagnosed through an assessment by your health care provider. Your health care provider will ask questions about any obsessions or compulsions you have and how they affect your life. Your health care provider may also ask about your medical history, prescription medicines, and drug use. Certain medical conditions and substances can cause symptoms that are similar to OCD. Your health care provider may also refer you to a mental health specialist. How is this treated? Treatment may include:  Cognitive therapy. This is a form of talk therapy. The goal is to identify and change the irrational thoughts associated with obsessions.  Behavioral therapy. A type of behavioral therapy called exposure and response prevention is often used. In this therapy, you will be exposed to the distressing situation that triggers your compulsion and be prevented from responding to it. With repetition of this process over time, you will no longer feel the distress or need to perform the compulsion.  Self-soothing. Meditation, deep breathing, or yoga can help you manage the physiological symptoms of anxiety and can help with how you think.  Medicine. Certain types of antidepressant medicine may help reduce or control OCD symptoms. Medicine is most effective when used with cognitive or behavioral therapy.  Treatment usually involves a combination of therapy and medicines. For severe OCD that does not respond to talk therapy and medicine, brain surgery  or electrical stimulation of specific areas of the brain (deep brain stimulation) may be considered. Follow these instructions at home:  Take over-the-counter and  prescription medicines only as told by your health care provider. Do not start taking any new medicines with approval from your health care provider.  Consider joining a support group for people with OCD.  Keep all follow-up visits as told by your health care provider. This is important. Contact a health care provider if:  You are not able to take your medicines as prescribed.  Your symptoms get worse. Get help right away if:  You have suicidal thoughts or thoughts about hurting yourself or others. If you ever feel like you may hurt yourself or others, or have thoughts about taking your own life, get help right away. You can go to your nearest emergency department or call:  Your local emergency services (911 in the [place]  A suicide crisis helpline, such as the [place] at (939)711-0924. This is open 24-hours a day.  Summary  Obsessive-compulsive disorder (OCD) is a brain-based anxiety disorder. People with OCD have obsessions, compulsions, or both.  OCD can interfere with personal relationships and normal activities at home, school, or work.  Treatment usually involves a combination of therapy and medicines. This information is not intended to replace advice given to you by your health care provider. Make sure you discuss any questions you have with your health care provider. Document Released: 12/01/2001 Document Revised: 09/21/2016 Document Reviewed: 09/21/2016 Elsevier Interactive Patient Education  2018 [employer].

## 2017-08-09 NOTE — Progress Notes (Signed)
Patient ID: Martha Hampton, female    DOB: 02-28-1966  Age: 51 y.o. MRN: [id number]    Subjective:  Subjective  HPI Martha Hampton presents for depression---- -  See phq 9     Review of Systems  Constitutional: Positive for fatigue. Negative for activity change, appetite change and unexpected weight change.  Respiratory: Negative for cough and shortness of breath.   Cardiovascular: Negative for chest pain and palpitations.  Psychiatric/Behavioral: Positive for dysphoric mood. Negative for behavioral problems. The patient is not nervous/anxious.     History Past Medical History:  Diagnosis Date  . Anemia    iron deficiency  . Anxiety   . Genital herpes   . GERD (gastroesophageal reflux disease)   . Hyperlipidemia     She has a past surgical history that includes Gastric bypass (07-04-07); Cholecystectomy (11-2005); Ankle surgery (1986); and Colonoscopy (01/06/2013).   Her family history includes Breast cancer in her maternal aunt; Cancer in her mother; Hypertension in her father and mother; Rectal cancer in her maternal grandmother.She reports that she has never smoked. She has never used smokeless tobacco. She reports that she does not drink alcohol or use drugs.  Current Outpatient Prescriptions on File Prior to Visit  Medication Sig Dispense Refill  . B Complex Vitamins (B COMPLEX 1 PO) Take by mouth daily.      . cetirizine (ZYRTEC) 10 MG tablet Take 10 mg by mouth daily.      . Vitamin D, Ergocalciferol, (DRISDOL) 50000 UNITS CAPS Take 50,000 Units by mouth every 7 (seven) days. Reported on 06/15/2016     No current facility-administered medications on file prior to visit.      Objective:  Objective  Physical Exam  Constitutional: She is oriented to person, place, and time. She appears well-developed and well-nourished.  HENT:  Head: Normocephalic and atraumatic.  Eyes: Conjunctivae and EOM are normal.  Neck: Normal range of motion. Neck supple. No JVD present.  Carotid bruit is not present. No thyromegaly present.  Cardiovascular: Normal rate, regular rhythm and normal heart sounds.   No murmur heard. Pulmonary/Chest: Effort normal and breath sounds normal. No respiratory distress. She has no wheezes. She has no rales. She exhibits no tenderness.  Musculoskeletal: She exhibits no edema.  Neurological: She is alert and oriented to person, place, and time.  Psychiatric: Her speech is normal. Judgment and thought content normal. She is agitated. Cognition and memory are normal. She exhibits a depressed mood.  Nursing note and vitals reviewed.  BP 136/86   Pulse 81   SpO2 98%  Wt Readings from Last 3 Encounters:  07/26/17 220 lb 6.4 oz (100 kg)  01/14/17 222 lb 9.6 oz (101 kg)  08/03/16 218 lb 3.2 oz (99 kg)     Lab Results  Component Value Date   WBC 5.9 08/09/2017   HGB 9.9 (L) 08/09/2017   HCT 32.3 (L) 08/09/2017   PLT 389.0 08/09/2017   GLUCOSE 89 08/09/2017   CHOL 174 04/15/2009   TRIG 56.0 04/15/2009   HDL 47.20 04/15/2009   LDLCALC 116 (H) 04/15/2009   ALT 27 08/09/2017   AST 40 (H) 08/09/2017   NA 136 08/09/2017   K 4.4 08/09/2017   CL 101 08/09/2017   CREATININE 0.85 08/09/2017   BUN 10 08/09/2017   CO2 29 08/09/2017   TSH 1.50 08/09/2017   INR 1.0 04/01/2015   HGBA1C 5.8 11/18/2015    Mr Lumbar Spine Wo Contrast  Result Date: 01/29/2017 CLINICAL DATA:  Lumbar radiculopathy. Low back pain radiating into both hips and legs. EXAM: MRI LUMBAR SPINE WITHOUT CONTRAST TECHNIQUE: Multiplanar, multisequence MR imaging of the lumbar spine was performed. No intravenous contrast was administered. COMPARISON:  06/12/2013 FINDINGS: Segmentation:  Standard. Alignment: Mildly exaggerated kyphosis at the thoracolumbar junction. Vertebrae: No fracture, evidence of discitis, or aggressive bone lesion. Conus medullaris: Extends to the L1 level and appears normal. Paraspinal and other soft tissues: Negative. Disc levels: T12- L1: Chronic disc  narrowing and left paracentral protrusion with retro seen osteophytes. No compressive stenosis or change from prior. L1-L2: Unremarkable. L2-L3: Spondylosis and mild facet arthropathy that has progressed from prior. No impingement L3-L4: Disc narrowing and bulging. Small right inferior foraminal protrusion without neural contact. Left extraforaminal disc protrusion that is new and posteriorly displaces the L3 nerve root. Facet arthropathy with mild spurring. No spinal is stenosis. L4-L5: Facet arthropathy with prominent spurring and borderline anterolisthesis. Borderline periarticular edema, stable. Circumferential disc bulging with extraforaminal spurs that are noncompressive. L5-S1:Mild facet spurring. Mild left extraforaminal endplate spurring. No herniation or impingement. IMPRESSION: 1. L3-4 left extraforaminal disc protrusion that posteriorly displaces but does not compress the left L3 nerve. 2. T12-L1 chronic left paracentral disc protrusion, noncompressive. No significant stenosis throughout the lumbar canal. 3. Facet arthropathy from L2-3 to L5-S1 is mild except at L4-5 where there is moderate to advanced spurring. Electronically Signed   By: Monte Fantasia M.D.   On: 01/29/2017 15:26     Assessment & Plan:  Plan  I have discontinued Martha Hampton PARoxetine and hydrOXYzine. I am also having her start on PARoxetine. Additionally, I am having her maintain her B Complex Vitamins (B COMPLEX 1 PO), Vitamin D (Ergocalciferol), cetirizine, and acyclovir.  Meds ordered this encounter  Medications  . PARoxetine (PAXIL) 40 MG tablet    Sig: Take 1 tablet (40 mg total) by mouth every morning.    Dispense:  90 tablet    Refill:  1  . acyclovir (ZOVIRAX) 400 MG tablet    Sig: TK 1 T PO TID    Dispense:  30 tablet    Refill:  2  . DISCONTD: acyclovir cream (ZOVIRAX) 5 %    Sig: Apply 1 application topically every 3 (three) hours.    Dispense:  15 g    Refill:  0    Problem List Items Addressed  This Visit      Unprioritized   ANEMIA-IRON DEFICIENCY   Relevant Orders   CBC with Differential/Platelet   IBC panel (Completed)   Ferritin (Completed)    Other Visit Diagnoses    Severe episode of recurrent major depressive disorder, without psychotic features ([place])    -  Primary   Relevant Medications   PARoxetine (PAXIL) 40 MG tablet   Other Relevant Orders   Ambulatory referral to Psychiatry   Ambulatory referral to Psychology   CBC with Differential/Platelet (Completed)   TSH (Completed)   Vitamin B12 (Completed)   Vitamin D 1,25 dihydroxy   Comprehensive metabolic panel (Completed)   POCT Urinalysis Dipstick (Automated) (Completed)   Obsessive-compulsive disorder, unspecified type       Relevant Orders   Ambulatory referral to Psychiatry   Ambulatory referral to Psychology   CBC with Differential/Platelet (Completed)   TSH (Completed)   Vitamin B12 (Completed)   Vitamin D 1,25 dihydroxy   Comprehensive metabolic panel (Completed)   POCT Urinalysis Dipstick (Automated) (Completed)   Herpes zoster without complication       Relevant Medications   acyclovir (  ZOVIRAX) 400 MG tablet      Follow-up: Return in about 3 months (around 11/09/2017).  Ann Held, DO

## 2017-08-10 ENCOUNTER — Encounter: Payer: Self-pay | Admitting: Family Medicine

## 2017-08-10 ENCOUNTER — Telehealth: Payer: Self-pay | Admitting: Family Medicine

## 2017-08-10 ENCOUNTER — Other Ambulatory Visit: Payer: Self-pay | Admitting: Family Medicine

## 2017-08-10 LAB — TSH: TSH: 1.5 u[IU]/mL (ref 0.35–4.50)

## 2017-08-10 LAB — COMPREHENSIVE METABOLIC PANEL WITH GFR
ALT: 27 U/L (ref 0–35)
AST: 40 U/L — ABNORMAL HIGH (ref 0–37)
Albumin: 4 g/dL (ref 3.5–5.2)
Alkaline Phosphatase: 108 U/L (ref 39–117)
BUN: 10 mg/dL (ref 6–23)
CO2: 29 meq/L (ref 19–32)
Calcium: 8.7 mg/dL (ref 8.4–10.5)
Chloride: 101 meq/L (ref 96–112)
Creatinine, Ser: 0.85 mg/dL (ref 0.40–1.20)
GFR: 74.98 mL/min
Glucose, Bld: 89 mg/dL (ref 70–99)
Potassium: 4.4 meq/L (ref 3.5–5.1)
Sodium: 136 meq/L (ref 135–145)
Total Bilirubin: 0.4 mg/dL (ref 0.2–1.2)
Total Protein: 7.3 g/dL (ref 6.0–8.3)

## 2017-08-10 LAB — CBC WITH DIFFERENTIAL/PLATELET
BASOS ABS: 0 10*3/uL (ref 0.0–0.1)
BASOS PCT: 0.5 % (ref 0.0–3.0)
EOS ABS: 0 10*3/uL (ref 0.0–0.7)
Eosinophils Relative: 0.8 % (ref 0.0–5.0)
HCT: 32.3 % — ABNORMAL LOW (ref 36.0–46.0)
Hemoglobin: 9.9 g/dL — ABNORMAL LOW (ref 12.0–15.0)
Lymphocytes Relative: 19.9 % (ref 12.0–46.0)
Lymphs Abs: 1.2 10*3/uL (ref 0.7–4.0)
MCHC: 30.5 g/dL (ref 30.0–36.0)
MCV: 70.8 fl — ABNORMAL LOW (ref 78.0–100.0)
MONO ABS: 0.4 10*3/uL (ref 0.1–1.0)
Monocytes Relative: 6.9 % (ref 3.0–12.0)
NEUTROS ABS: 4.3 10*3/uL (ref 1.4–7.7)
NEUTROS PCT: 71.9 % (ref 43.0–77.0)
PLATELETS: 389 10*3/uL (ref 150.0–400.0)
RBC: 4.57 Mil/uL (ref 3.87–5.11)
RDW: 19.1 % — AB (ref 11.5–15.5)
WBC: 5.9 10*3/uL (ref 4.0–10.5)

## 2017-08-10 LAB — IBC PANEL
Iron: 17 ug/dL — ABNORMAL LOW (ref 42–145)
Saturation Ratios: 2.3 % — ABNORMAL LOW (ref 20.0–50.0)
Transferrin: 519 mg/dL — ABNORMAL HIGH (ref 212.0–360.0)

## 2017-08-10 LAB — VITAMIN B12: VITAMIN B 12: 189 pg/mL — AB (ref 211–911)

## 2017-08-10 LAB — FERRITIN: Ferritin: 9.1 ng/mL — ABNORMAL LOW (ref 10.0–291.0)

## 2017-08-10 MED ORDER — ACYCLOVIR 5 % EX OINT: 1.0000 "application " | TOPICAL_OINTMENT | CUTANEOUS | 1 refills | Status: DC

## 2017-08-10 NOTE — Telephone Encounter (Signed)
Patient informed and stated thank you

## 2017-08-10 NOTE — Telephone Encounter (Signed)
Relation to AX:ENMM Call back [place]: [id number] [place], [place]  Reason for call:  Pharmacy will not cover acyclovir cream (ZOVIRAX) 5 % and will cover oinment in addition the cost will be cheaper, requesting Rx sent today, please advise

## 2017-08-10 NOTE — Telephone Encounter (Signed)
sent 

## 2017-08-11 ENCOUNTER — Other Ambulatory Visit: Payer: Self-pay | Admitting: Behavioral Health

## 2017-08-11 ENCOUNTER — Other Ambulatory Visit: Payer: Self-pay | Admitting: Family Medicine

## 2017-08-11 DIAGNOSIS — D649 Anemia, unspecified: Secondary | ICD-10-CM

## 2017-08-11 MED ORDER — ACYCLOVIR 5 % EX OINT: 1.0000 "application " | TOPICAL_OINTMENT | CUTANEOUS | 1 refills | Status: DC

## 2017-08-11 NOTE — Telephone Encounter (Signed)
Pt has been made aware. She is grateful  Thank you !

## 2017-08-11 NOTE — Telephone Encounter (Signed)
Ointment was sent to the incorrect pharmacy. Pt says that she no longer uses the [facility] (where it was sent to) I removed pharmacy off file.    Pt is on her way out of town. She would like to have it sent to pharmacy provided.      Pharmacy: [id number] [place], [place]

## 2017-08-11 NOTE — Telephone Encounter (Signed)
Rx sent to the corrected pharmacy. Thanks.

## 2017-08-12 LAB — VITAMIN D 1,25 DIHYDROXY
VITAMIN D3 1, 25 (OH): 47 pg/mL
Vitamin D 1, 25 (OH)2 Total: 47 pg/mL (ref 18–72)
Vitamin D2 1, 25 (OH)2: <8 pg/mL

## 2017-08-19 ENCOUNTER — Ambulatory Visit: Payer: Self-pay

## 2017-08-21 ENCOUNTER — Other Ambulatory Visit: Payer: Self-pay | Admitting: Family Medicine

## 2017-08-21 DIAGNOSIS — F419 Anxiety disorder, unspecified: Secondary | ICD-10-CM

## 2017-08-24 NOTE — Telephone Encounter (Signed)
On 8.2.18 #[id number] was faxed in/thx dmf

## 2017-08-26 ENCOUNTER — Ambulatory Visit (INDEPENDENT_AMBULATORY_CARE_PROVIDER_SITE_OTHER): Payer: 59

## 2017-08-26 DIAGNOSIS — E538 Deficiency of other specified B group vitamins: Secondary | ICD-10-CM | POA: Diagnosis not present

## 2017-08-26 MED ORDER — CYANOCOBALAMIN 1000 MCG/ML IJ SOLN: 1000.0000 ug | Freq: Once | INTRAMUSCULAR | Status: AC

## 2017-08-26 NOTE — Progress Notes (Signed)
Martha R Lowne Chase, DO 

## 2017-08-26 NOTE — Progress Notes (Signed)
Pre visit review using our clinic tool,if applicable. No additional management support is needed unless otherwise documented below in the visit note.   Patient in for 1st B12 injection per order from Dr. Floy Sabina due to patient having B 12 deficiency.  Patient given 1000 mcg IM left deltoid and tolerated well.  Appointment for next injection scheduled for 1 week.   Patient has no complaints this visit.

## 2017-08-27 ENCOUNTER — Encounter: Payer: Self-pay | Admitting: Family Medicine

## 2017-08-27 ENCOUNTER — Ambulatory Visit (INDEPENDENT_AMBULATORY_CARE_PROVIDER_SITE_OTHER): Payer: 59 | Admitting: Family Medicine

## 2017-08-27 VITALS — BP 122/78 | HR 93 | Temp 98.5°F | Ht 63.0 in | Wt 226.6 lb

## 2017-08-27 DIAGNOSIS — F418 Other specified anxiety disorders: Secondary | ICD-10-CM

## 2017-08-27 DIAGNOSIS — E538 Deficiency of other specified B group vitamins: Secondary | ICD-10-CM

## 2017-08-27 MED ORDER — CYANOCOBALAMIN 1000 MCG/ML IJ SOLN: 1000.0000 ug | INTRAMUSCULAR | 11 refills | Status: DC

## 2017-08-27 MED ORDER — CYANOCOBALAMIN 1000 MCG/ML IJ SOLN: INTRAMUSCULAR | 11 refills | Status: DC

## 2017-08-27 NOTE — Patient Instructions (Signed)
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
## 2017-08-29 NOTE — Assessment & Plan Note (Signed)
Stable con't meds 

## 2017-09-02 ENCOUNTER — Ambulatory Visit: Payer: Self-pay

## 2018-01-26 ENCOUNTER — Telehealth: Payer: Self-pay | Admitting: Family Medicine

## 2018-01-26 NOTE — Telephone Encounter (Unsigned)
Copied from [place]. Topic: Referral - Request >> Jan 26, 2018  9:53 AM Neva Seat wrote: Pt is needing a referral to a Rheumatoid doctor -  Dr. Hermelinda Medicus - fax [phone]  /  phone [phone]. Pt has moved and needs to see this dr in her location.  Please call pt once this referral has been done.

## 2018-01-27 ENCOUNTER — Encounter: Payer: Self-pay | Admitting: Family Medicine

## 2018-01-27 ENCOUNTER — Ambulatory Visit: Payer: 59 | Admitting: Family Medicine

## 2018-01-27 VITALS — BP 140/74 | HR 94 | Temp 98.1°F | Resp 16 | Ht 63.0 in | Wt 238.2 lb

## 2018-01-27 DIAGNOSIS — R600 Localized edema: Secondary | ICD-10-CM

## 2018-01-27 DIAGNOSIS — M069 Rheumatoid arthritis, unspecified: Secondary | ICD-10-CM

## 2018-01-27 MED ORDER — FUROSEMIDE 40 MG PO TABS: 40.0000 mg | ORAL_TABLET | Freq: Every day | ORAL | 3 refills | Status: DC

## 2018-01-27 NOTE — Patient Instructions (Signed)
Rheumatoid Arthritis Rheumatoid arthritis (RA) is a long-term (chronic) disease that causes inflammation in your joints. RA may start slowly. It usually affects the small joints of the hands and feet. Usually, the same joints are affected on both sides of your body. Inflammation from RA can also affect other parts of your body, including your heart, eyes, or lungs. RA is an autoimmune disease. That means that your body's defense system (immune system) mistakenly attacks healthy body tissues. There is no cure for RA, but medicines can help your symptoms and halt or slow down the progression of the disease. What are the causes? The exact cause of RA is not known. What increases the risk? This condition is more likely to develop in:  Women.  People who have a family history of RA or other autoimmune diseases.  What are the signs or symptoms? Symptoms of this condition vary from person to person. Symptoms usually start gradually. They are often worse in the morning. The first symptom may be morning stiffness that lasts longer than 30 minutes. As RA progresses, symptoms may include:  Pain, stiffness, swelling, warmth, and tenderness in joints on both sides of your body.  Loss of energy.  Loss of appetite.  Weight loss.  Low-grade fever.  Dry eyes and dry mouth.  Firm lumps (rheumatoid nodules) that grow beneath your skin in areas such as your forearm bones near your elbows and on your hands.  Changes in the appearance of joints (deformity) and loss of joint function.  Symptoms of RA often come and go. Sometimes, symptoms get worse for a period of time. These are called flares. How is this diagnosed? This condition is diagnosed based on your symptoms, medical history, and physical exam. You may have X-rays or MRI to check for the type of joint changes that are caused by RA. You may also have blood tests to look for:  Proteins (antibodies) that your immune system may make if you have RA.  They include rheumatoid factor (RF) and anti-CCP. ? When blood tests show these proteins, you are said to have "seropositive RA." ? When blood tests do not show these proteins, you may have "seronegative RA."  Inflammation in your blood.  A low number of red blood cells (anemia).  How is this treated? The goals of treatment are to relieve pain, reduce inflammation, and slow down or stop joint damage and disability. Treatment may include:  Lifestyle changes. It is important to rest, eat a healthy diet, and exercise.  Medicines. Your health care provider may adjust your medicines every 3 months until treatment goals are reached. Common medicines include: ? Pain relievers (analgesics). ? Corticosteroids and NSAIDs to reduce inflammation. ? Disease-modifying antirheumatic drugs (DMARDs) to try to slow the course of the disease. ? Biologic response modifiers to reduce inflammation and damage.  Physical therapy and occupational therapy.  Surgery, if you have severe joint damage. Joint replacement or fusing of joints may be needed.  Your health care provider will work with you to identify the best treatment option for you based on assessment of the overall disease activity in your body. Follow these instructions at home:  Take over-the-counter and prescription medicines only as told by your health care provider.  Start an exercise program as told by your health care provider.  Rest when you are having a flare.  Return to your normal activities as told by your health care provider. Ask your health care provider what activities are safe for you.  Keep all   follow-up visits as told by your health care provider. This is important. Where to find more information:  American College of Rheumatology: www.rheumatology.org  Arthritis Foundation: www.arthritis.org Contact a health care provider if:  You have a flare-up of RA symptoms.  You have a fever.  You have side effects from your  medicines. Get help right away if:  You have chest pain.  You have trouble breathing.  You quickly develop a hot, painful joint that is more severe than your usual joint aches. This information is not intended to replace advice given to you by your health care provider. Make sure you discuss any questions you have with your health care provider. Document Released: 12/04/2000 Document Revised: 05/12/2016 Document Reviewed: 09/19/2015 Elsevier Interactive Patient Education  2018 Elsevier Inc.   

## 2018-01-27 NOTE — Progress Notes (Signed)
Subjective:  I acted as a Education administrator for [employer], RMA   Patient ID: Martha Hampton, female    DOB: 15-May-1966, 52 y.o.   MRN: [id number]  Chief Complaint  Patient presents with  . Arthritis    HPI  Patient is in today for referral for arthritis.   Pt has  Hx of RA and needs referral to go back to get back on methtrexate   Patient Care Team: Carollee Herter, Alferd Apa, DO as PCP - General   Past Medical History:  Diagnosis Date  . Anemia    iron deficiency  . Anxiety   . Genital herpes   . GERD (gastroesophageal reflux disease)   . Hyperlipidemia     Past Surgical History:  Procedure Laterality Date  . [place]   left  . CHOLECYSTECTOMY  11-2005  . COLONOSCOPY  01/06/2013   Procedure: COLONOSCOPY;  Surgeon: Beryle Beams, MD;  Location: [facility];  Service: Gastroenterology;  Laterality: N/A;  . 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 Hypertension Mother   . Hypertension Father   . Cancer Mother        non-dodgkins mantel cell  . Rectal cancer Maternal Grandmother   . Breast cancer Maternal Aunt     Social History   Socioeconomic History  . Marital status: Married    Spouse name: Not on file  . Number of children: Not on file  . Years of education: Not on file  . Highest education level: Not on file  Social Needs  . Financial resource strain: Not on file  . Food insecurity - worry: Not on file  . Food insecurity - inability: Not on file  . Transportation needs - medical: Not on file  . Transportation needs - non-medical: Not on file  Occupational History  . Not on file  Tobacco Use  . Smoking status: Never Smoker  . Smokeless tobacco: Never Used  Substance and Sexual Activity  . Alcohol use: No  . Drug use: No  . Sexual activity: Not on file  Other Topics Concern  . Not on file  Social History Narrative  . Not on file    Outpatient Medications Prior to Visit  Medication Sig Dispense Refill  .  acyclovir (ZOVIRAX) 400 MG tablet TK 1 T PO TID 30 tablet 2  . acyclovir ointment (ZOVIRAX) 5 % Apply 1 application topically every 3 (three) hours. 15 g 1  . B Complex Vitamins (B COMPLEX 1 PO) Take by mouth daily.      . cetirizine (ZYRTEC) 10 MG tablet Take 10 mg by mouth daily.      . cyanocobalamin (,VITAMIN B-12,) 1000 MCG/ML injection 1 ml sq weeklyx 3 weekl then 1 ml sq monthly 1 mL 11  . PARoxetine (PAXIL) 40 MG tablet Take 1 tablet (40 mg total) by mouth every morning. 90 tablet 1  . Vitamin D, Ergocalciferol, (DRISDOL) 50000 UNITS CAPS Take 50,000 Units by mouth every 7 (seven) days. Reported on 06/15/2016     Facility-Administered Medications Prior to Visit  Medication Dose Route Frequency Provider Last Rate Last Dose  . cyanocobalamin ((VITAMIN B-12)) injection 1,000 mcg  1,000 mcg Intramuscular Once Carollee Herter, Kendrick Fries R, DO        Allergies  Allergen Reactions  . Anesthetics, Amide     Pt has malignant hyperthermia   . Penicillins     REACTION: RASH  Review of Systems  Constitutional: Negative for chills, fever and malaise/fatigue.  HENT: Negative for congestion and hearing loss.   Eyes: Negative for discharge.  Respiratory: Negative for cough, sputum production and shortness of breath.   Cardiovascular: Negative for chest pain, palpitations and leg swelling.  Gastrointestinal: Negative for abdominal pain, blood in stool, constipation, diarrhea, heartburn, nausea and vomiting.  Genitourinary: Negative for dysuria, frequency, hematuria and urgency.  Musculoskeletal: Positive for back pain, joint pain and myalgias. Negative for falls.  Skin: Negative for rash.  Neurological: Negative for dizziness, sensory change, loss of consciousness, weakness and headaches.  Endo/Heme/Allergies: Negative for environmental allergies. Does not bruise/bleed easily.  Psychiatric/Behavioral: Negative for depression and suicidal ideas. The patient is not nervous/anxious and does not have  insomnia.        Objective:    Physical Exam  Constitutional: She is oriented to person, place, and time. She appears well-developed and well-nourished.  HENT:  Head: Normocephalic and atraumatic.  Eyes: Conjunctivae and EOM are normal.  Neck: Normal range of motion. Neck supple. No JVD present. Carotid bruit is not present. No thyromegaly present.  Cardiovascular: Normal rate, regular rhythm and normal heart sounds.  No murmur heard. Pulmonary/Chest: Effort normal and breath sounds normal. No respiratory distress. She has no wheezes. She has no rales. She exhibits no tenderness.  Musculoskeletal: She exhibits no edema.  Neurological: She is alert and oriented to person, place, and time.  Psychiatric: She has a normal mood and affect.  Nursing note and vitals reviewed.   BP 140/74 (BP Location: Left Arm, Patient Position: Sitting, Cuff Size: Large)   Pulse 94   Temp 98.1 F (36.7 C) (Oral)   Resp 16   Ht 5\' 3"  (1.6 m)   Wt 238 lb 3.2 oz (108 kg)   SpO2 97%   BMI 42.20 kg/m  Wt Readings from Last 3 Encounters:  01/27/18 238 lb 3.2 oz (108 kg)  08/27/17 226 lb 9.6 oz (102.8 kg)  07/26/17 220 lb 6.4 oz (100 kg)   BP Readings from Last 3 Encounters:  01/27/18 140/74  08/27/17 122/78  08/09/17 136/86     Immunization History  Administered Date(s) Administered  . Td 04/15/2009    Health Maintenance  Topic Date Due  . MAMMOGRAM  09/20/2016  . PAP SMEAR  10/21/2017  . INFLUENZA VACCINE  03/20/2018 (Originally 07/21/2017)  . TETANUS/TDAP  04/16/2019  . COLONOSCOPY  01/06/2023  . HIV Screening  Completed    Lab Results  Component Value Date   WBC 5.9 08/09/2017   HGB 9.9 (L) 08/09/2017   HCT 32.3 (L) 08/09/2017   PLT 389.0 08/09/2017   GLUCOSE 89 08/09/2017   CHOL 174 04/15/2009   TRIG 56.0 04/15/2009   HDL 47.20 04/15/2009   LDLCALC 116 (H) 04/15/2009   ALT 27 08/09/2017   AST 40 (H) 08/09/2017   NA 136 08/09/2017   K 4.4 08/09/2017   CL 101 08/09/2017    CREATININE 0.85 08/09/2017   BUN 10 08/09/2017   CO2 29 08/09/2017   TSH 1.50 08/09/2017   INR 1.0 04/01/2015   HGBA1C 5.8 11/18/2015    Lab Results  Component Value Date   TSH 1.50 08/09/2017   Lab Results  Component Value Date   WBC 5.9 08/09/2017   HGB 9.9 (L) 08/09/2017   HCT 32.3 (L) 08/09/2017   MCV 70.8 (L) 08/09/2017   PLT 389.0 08/09/2017   Lab Results  Component Value Date   NA 136 08/09/2017  K 4.4 08/09/2017   CO2 29 08/09/2017   GLUCOSE 89 08/09/2017   BUN 10 08/09/2017   CREATININE 0.85 08/09/2017   BILITOT 0.4 08/09/2017   ALKPHOS 108 08/09/2017   AST 40 (H) 08/09/2017   ALT 27 08/09/2017   PROT 7.3 08/09/2017   ALBUMIN 4.0 08/09/2017   CALCIUM 8.7 08/09/2017   GFR 74.98 08/09/2017   Lab Results  Component Value Date   CHOL 174 04/15/2009   Lab Results  Component Value Date   HDL 47.20 04/15/2009   Lab Results  Component Value Date   LDLCALC 116 (H) 04/15/2009   Lab Results  Component Value Date   TRIG 56.0 04/15/2009   Lab Results  Component Value Date   CHOLHDL 4 04/15/2009   Lab Results  Component Value Date   HGBA1C 5.8 11/18/2015         Assessment & Plan:   Problem List Items Addressed This Visit    None    Visit Diagnoses    Rheumatoid arthritis involving multiple sites, unspecified rheumatoid factor presence ([place])    -  Primary   Relevant Orders   Ambulatory referral to Rheumatology   Lower extremity edema       Relevant Medications   furosemide (LASIX) 40 MG tablet    elevate legs Drink plenty of water    I am having [name] start on furosemide. I am also having her maintain her B Complex Vitamins (B COMPLEX 1 PO), Vitamin D (Ergocalciferol), cetirizine, PARoxetine, acyclovir, acyclovir ointment, and cyanocobalamin. We will continue to administer cyanocobalamin.  Meds ordered this encounter  Medications  . furosemide (LASIX) 40 MG tablet    Sig: Take 1 tablet (40 mg total) by mouth daily.     Dispense:  30 tablet    Refill:  3   CMA served as scribe during this visit. History, Physical and Plan performed by medical provider. Documentation and orders reviewed and attested to.    Ann Held, DO

## 2018-01-28 NOTE — Telephone Encounter (Signed)
YL-Plz see pt message about referral to specific doc with fax number included/thx dmf

## 2018-01-28 NOTE — Telephone Encounter (Signed)
Ok to put referral in for myalgias , joint pains

## 2018-02-01 NOTE — Telephone Encounter (Signed)
SR-Plz see information below about referral/thx dmf

## 2018-02-03 ENCOUNTER — Telehealth: Payer: Self-pay | Admitting: *Deleted

## 2018-02-03 NOTE — Telephone Encounter (Signed)
Copied from [place] [id number]. Topic: Inquiry >> Feb 01, 2018  9:17 AM Pricilla Handler wrote: Reason for CRM: Margarita Grizzle from [facility] # [phone], ext [phone]) called wanting patient's rheumatology records from her previous Rhematologist. Margarita Grizzle states that they cannot schedule patient's appt until these records are received. Please call Margarita Grizzle at [phone], ext [phone].       Thank You!!!

## 2018-02-04 NOTE — Telephone Encounter (Signed)
Patient has records she will call office and take them by there

## 2018-03-22 LAB — CBC AND DIFFERENTIAL
HCT: 36 (ref 36–46)
Hemoglobin: 11.3 — AB (ref 12.0–16.0)
Neutrophils Absolute: 3
PLATELETS: 303 (ref 150–399)
WBC: 4.4

## 2018-03-22 LAB — HEPATIC FUNCTION PANEL
ALT: 47 — AB (ref 7–35)
AST: 74 — AB (ref 13–35)
Alkaline Phosphatase: 134 — AB (ref 25–125)
Bilirubin, Total: 0.5

## 2018-03-22 LAB — BASIC METABOLIC PANEL
POTASSIUM: 3.7 (ref 3.4–5.3)
SODIUM: 141 (ref 137–147)

## 2018-04-02 ENCOUNTER — Other Ambulatory Visit: Payer: Self-pay | Admitting: Family Medicine

## 2018-04-02 DIAGNOSIS — B029 Zoster without complications: Secondary | ICD-10-CM

## 2018-05-04 ENCOUNTER — Telehealth: Payer: Self-pay | Admitting: *Deleted

## 2018-05-04 ENCOUNTER — Encounter: Payer: Self-pay | Admitting: *Deleted

## 2018-05-04 DIAGNOSIS — R748 Abnormal levels of other serum enzymes: Secondary | ICD-10-CM

## 2018-05-04 NOTE — Telephone Encounter (Signed)
Correction to previous note.  Labs came from [facility].

## 2018-05-04 NOTE — Telephone Encounter (Signed)
Received labs from gyn office and she had elevated liver enzymes.  Spoke with patient and she stated that her liver enzymes were never rechecked by her gyn office.  Per Dr. Etter Sjogren put in future orders for patient to come in for lab only to recheck cmp.  Future orders placed.  Patient will be out of town until Tuesday and when she get back she will make a lab only appt to recheck.

## 2018-06-02 ENCOUNTER — Telehealth: Payer: Self-pay | Admitting: *Deleted

## 2018-06-02 NOTE — Telephone Encounter (Signed)
Patient notified

## 2018-06-02 NOTE — Telephone Encounter (Signed)
Pt would like to know if this copy of the addendum could be sent to [facility], per the release in chart scanned 05/11/18. Pt states it needs to be faxed asap to  [phone] Attn: Omelia Blackwater
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
## 2018-06-02 NOTE — Telephone Encounter (Signed)
Note addened

## 2018-06-02 NOTE — Assessment & Plan Note (Signed)
Pt slowly improving ---  Use warm compresses  Hematoma should resolve on its own  rto prn

## 2018-06-03 NOTE — Telephone Encounter (Signed)
Patient given number to call medical records

## 2018-06-03 NOTE — Telephone Encounter (Signed)
Note has to be sent

## 2018-08-02 ENCOUNTER — Other Ambulatory Visit: Payer: Self-pay | Admitting: Student

## 2018-08-02 DIAGNOSIS — S92331D Displaced fracture of third metatarsal bone, right foot, subsequent encounter for fracture with routine healing: Secondary | ICD-10-CM

## 2018-08-09 ENCOUNTER — Ambulatory Visit: Payer: 59 | Admitting: Family Medicine

## 2018-08-09 VITALS — BP 137/68 | HR 97 | Temp 98.6°F | Resp 16 | Ht 63.0 in | Wt 238.0 lb

## 2018-08-09 DIAGNOSIS — B009 Herpesviral infection, unspecified: Secondary | ICD-10-CM

## 2018-08-09 DIAGNOSIS — R002 Palpitations: Secondary | ICD-10-CM

## 2018-08-09 DIAGNOSIS — M150 Primary generalized (osteo)arthritis: Secondary | ICD-10-CM

## 2018-08-09 DIAGNOSIS — S92301A Fracture of unspecified metatarsal bone(s), right foot, initial encounter for closed fracture: Secondary | ICD-10-CM

## 2018-08-09 DIAGNOSIS — M159 Polyosteoarthritis, unspecified: Secondary | ICD-10-CM | POA: Insufficient documentation

## 2018-08-09 HISTORY — DX: Palpitations: R00.2

## 2018-08-09 HISTORY — DX: Fracture of unspecified metatarsal bone(s), right foot, initial encounter for closed fracture: S92.301A

## 2018-08-09 HISTORY — DX: Herpesviral infection, unspecified: B00.9

## 2018-08-09 HISTORY — DX: Primary generalized (osteo)arthritis: M15.0

## 2018-08-09 MED ORDER — VALACYCLOVIR HCL 500 MG PO TABS: 500.0000 mg | ORAL_TABLET | Freq: Every day | ORAL | 5 refills | Status: DC

## 2018-08-09 MED ORDER — MELOXICAM 15 MG PO TABS: ORAL_TABLET | ORAL | 0 refills | Status: DC

## 2018-08-09 MED ORDER — PREGABALIN 50 MG PO CAPS: 50.0000 mg | ORAL_CAPSULE | Freq: Three times a day (TID) | ORAL | 1 refills | Status: DC

## 2018-08-09 NOTE — Assessment & Plan Note (Signed)
Valtrex 1000 qd
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
## 2018-08-09 NOTE — Assessment & Plan Note (Signed)
Per ortho More surgery pending

## 2018-08-09 NOTE — Patient Instructions (Signed)

## 2018-08-09 NOTE — Progress Notes (Signed)
Patient ID: Martha Hampton, female    DOB: Dec 15, 1966  Age: 52 y.o. MRN: [id number]    Subjective:  Subjective  HP JAPJI KOK presents for f/u rheum--- they dx her with fibromyalgia.  She has been dx with arthritis in the past and has been taking advil q4h with some relief.   She also needs med for the hsv II Pt also recently had surgery on her R foot-- she fx metatarsal 2-5-- they will need to do more surgery  She then mentioned occasional palpitations that she has had and doesn't know if its the stress of everything or medication.     Review of Systems  Constitutional: Negative for activity change, appetite change, chills and fever.  HENT: Negative for congestion and hearing loss.   Eyes: Negative for discharge.  Respiratory: Negative for cough and shortness of breath.   Cardiovascular: Positive for chest pain and palpitations. Negative for leg swelling.  Gastrointestinal: Negative for abdominal distention, abdominal pain, blood in stool, constipation, diarrhea, nausea and vomiting.  Genitourinary: Negative for difficulty urinating, dyspareunia, dysuria, flank pain, frequency, genital sores, hematuria, menstrual problem, pelvic pain, urgency, vaginal discharge and vaginal pain.  Musculoskeletal: Positive for arthralgias, gait problem and myalgias. Negative for back pain.  Skin: Negative for rash.  Allergic/Immunologic: Negative for environmental allergies.  Neurological: Negative for dizziness, weakness and headaches.  Hematological: Does not bruise/bleed easily.  Psychiatric/Behavioral: Negative for suicidal ideas. The patient is not nervous/anxious.     History Past Medical History:  Diagnosis Date  . Anemia    iron deficiency  . Anxiety   . Genital herpes   . GERD (gastroesophageal reflux disease)   . Hyperlipidemia     She has a past surgical history that includes Gastric bypass (07-04-07); Cholecystectomy (11-2005); Ankle surgery (1986); and Colonoscopy  (01/06/2013).   Her family history includes Breast cancer in her maternal aunt; Cancer in her mother; Hypertension in her father and mother; Rectal cancer in her maternal grandmother.She reports that she has never smoked. She has never used smokeless tobacco. She reports that she does not drink alcohol or use drugs.  Current Outpatient Medications on File Prior to Visit  Medication Sig Dispense Refill  . B Complex Vitamins (B COMPLEX 1 PO) Take by mouth daily.      . cetirizine (ZYRTEC) 10 MG tablet Take 10 mg by mouth daily.      . cyanocobalamin (,VITAMIN B-12,) 1000 MCG/ML injection 1 ml sq weeklyx 3 weekl then 1 ml sq monthly 1 mL 11  . furosemide (LASIX) 40 MG tablet Take 1 tablet (40 mg total) by mouth daily. 30 tablet 3  . PARoxetine (PAXIL) 40 MG tablet Take 1 tablet (40 mg total) by mouth every morning. 90 tablet 1  . Vitamin D, Ergocalciferol, (DRISDOL) 50000 UNITS CAPS Take 50,000 Units by mouth every 7 (seven) days. Reported on 06/15/2016     Current Facility-Administered Medications on File Prior to Visit  Medication Dose Route Frequency Provider Last Rate Last Dose  . cyanocobalamin ((VITAMIN B-12)) injection 1,000 mcg  1,000 mcg Intramuscular Once Carollee Herter, Kendrick Fries R, DO         Objective:  Objective  Physical Exam  Constitutional: She is oriented to person, place, and time. She appears well-developed and well-nourished.  HENT:  Head: Normocephalic and atraumatic.  Eyes: Conjunctivae and EOM are normal.  Neck: Normal range of motion. Neck supple. No JVD present. Carotid bruit is not present. No thyromegaly present.  Cardiovascular: Normal rate, regular  rhythm and normal heart sounds.  No murmur heard. Pulmonary/Chest: Effort normal and breath sounds normal. No respiratory distress. She has no wheezes. She has no rales. She exhibits no tenderness.  Musculoskeletal: She exhibits edema.       Right foot: There is tenderness and swelling.       Feet:  Neurological: She is  alert and oriented to person, place, and time.  Psychiatric: She has a normal mood and affect.  Nursing note and vitals reviewed.  BP 137/68 (BP Location: Left Arm, Cuff Size: Large)   Pulse 97   Temp 98.6 F (37 C) (Oral)   Resp 16   Ht 5\' 3"  (1.6 m)   Wt 238 lb (108 kg) Comment: patient has post op boot on  SpO2 99%   BMI 42.16 kg/m  Wt Readings from Last 3 Encounters:  08/09/18 238 lb (108 kg)  01/27/18 238 lb 3.2 oz (108 kg)  08/27/17 226 lb 9.6 oz (102.8 kg)     Lab Results  Component Value Date   WBC 4.4 03/22/2018   HGB 11.3 (A) 03/22/2018   HCT 36 03/22/2018   PLT 303 03/22/2018   GLUCOSE 89 08/09/2017   CHOL 174 04/15/2009   TRIG 56.0 04/15/2009   HDL 47.20 04/15/2009   LDLCALC 116 (H) 04/15/2009   ALT 47 (A) 03/22/2018   AST 74 (A) 03/22/2018   NA 141 03/22/2018   K 3.7 03/22/2018   CL 101 08/09/2017   CREATININE 0.85 08/09/2017   BUN 10 08/09/2017   CO2 29 08/09/2017   TSH 1.50 08/09/2017   INR 1.0 04/01/2015   HGBA1C 5.8 11/18/2015    Mr Lumbar Spine Wo Contrast  Result Date: 01/29/2017 CLINICAL DATA:  Lumbar radiculopathy. Low back pain radiating into both hips and legs. EXAM: MRI LUMBAR SPINE WITHOUT CONTRAST TECHNIQUE: Multiplanar, multisequence MR imaging of the lumbar spine was performed. No intravenous contrast was administered. COMPARISON:  06/12/2013 FINDINGS: Segmentation:  Standard. Alignment: Mildly exaggerated kyphosis at the thoracolumbar junction. Vertebrae: No fracture, evidence of discitis, or aggressive bone lesion. Conus medullaris: Extends to the L1 level and appears normal. Paraspinal and other soft tissues: Negative. Disc levels: T12- L1: Chronic disc narrowing and left paracentral protrusion with retro seen osteophytes. No compressive stenosis or change from prior. L1-L2: Unremarkable. L2-L3: Spondylosis and mild facet arthropathy that has progressed from prior. No impingement L3-L4: Disc narrowing and bulging. Small right inferior  foraminal protrusion without neural contact. Left extraforaminal disc protrusion that is new and posteriorly displaces the L3 nerve root. Facet arthropathy with mild spurring. No spinal is stenosis. L4-L5: Facet arthropathy with prominent spurring and borderline anterolisthesis. Borderline periarticular edema, stable. Circumferential disc bulging with extraforaminal spurs that are noncompressive. L5-S1:Mild facet spurring. Mild left extraforaminal endplate spurring. No herniation or impingement. IMPRESSION: 1. L3-4 left extraforaminal disc protrusion that posteriorly displaces but does not compress the left L3 nerve. 2. T12-L1 chronic left paracentral disc protrusion, noncompressive. No significant stenosis throughout the lumbar canal. 3. Facet arthropathy from L2-3 to L5-S1 is mild except at L4-5 where there is moderate to advanced spurring. Electronically Signed   By: Monte Fantasia M.D.   On: 01/29/2017 15:26     Assessment & Plan:  Plan  I have discontinued Andree Moro. Lohn's acyclovir ointment and acyclovir. I am also having her start on meloxicam, pregabalin, and valACYclovir. Additionally, I am having her maintain her B Complex Vitamins (B COMPLEX 1 PO), Vitamin D (Ergocalciferol), cetirizine, PARoxetine, cyanocobalamin, and furosemide. We will  continue to administer cyanocobalamin.  Meds ordered this encounter  Medications  . meloxicam (MOBIC) 15 MG tablet    Sig: 1/2 -1 tab po qd prn    Dispense:  30 tablet    Refill:  0  . pregabalin (LYRICA) 50 MG capsule    Sig: Take 1 capsule (50 mg total) by mouth 3 (three) times daily.    Dispense:  90 capsule    Refill:  1  . valACYclovir (VALTREX) 500 MG tablet    Sig: Take 1 tablet (500 mg total) by mouth daily.    Dispense:  30 tablet    Refill:  5    Problem List Items Addressed This Visit      Unprioritized   Closed displaced fracture of metatarsal bone of right foot    Per ortho More surgery pending      HSV-2 infection     Valtrex 1000 qd       Relevant Medications   valACYclovir (VALTREX) 500 MG tablet   Palpitations    ekg--NSR -- no acute changes May be due to stress but if it occurs again we will check labs and ? Event monitor      Relevant Orders   EKG 12-Lead (Completed)   Primary osteoarthritis involving multiple joints - Primary    mobic daily lyrica rto if symptoms persist or worsens       Relevant Medications   meloxicam (MOBIC) 15 MG tablet   pregabalin (LYRICA) 50 MG capsule    pt here for >45 min with > 50% face to face discuss above   Follow-up: Return if symptoms worsen or fail to improve.  Ann Held, DO

## 2018-08-09 NOTE — Assessment & Plan Note (Signed)
mobic daily lyrica rto if symptoms persist or worsens

## 2018-08-10 ENCOUNTER — Encounter: Payer: Self-pay | Admitting: Family Medicine

## 2018-09-03 ENCOUNTER — Other Ambulatory Visit: Payer: Self-pay | Admitting: Family Medicine

## 2018-09-03 DIAGNOSIS — M159 Polyosteoarthritis, unspecified: Secondary | ICD-10-CM

## 2018-09-03 DIAGNOSIS — M150 Primary generalized (osteo)arthritis: Principal | ICD-10-CM

## 2018-10-07 ENCOUNTER — Other Ambulatory Visit: Payer: Self-pay | Admitting: Family Medicine

## 2018-10-07 DIAGNOSIS — F332 Major depressive disorder, recurrent severe without psychotic features: Secondary | ICD-10-CM

## 2019-01-31 ENCOUNTER — Other Ambulatory Visit: Payer: Self-pay | Admitting: Family Medicine

## 2019-01-31 DIAGNOSIS — B029 Zoster without complications: Secondary | ICD-10-CM

## 2019-03-14 ENCOUNTER — Ambulatory Visit (INDEPENDENT_AMBULATORY_CARE_PROVIDER_SITE_OTHER): Payer: 59 | Admitting: Family Medicine

## 2019-03-14 ENCOUNTER — Encounter: Payer: Self-pay | Admitting: Family Medicine

## 2019-03-14 ENCOUNTER — Other Ambulatory Visit: Payer: Self-pay

## 2019-03-14 DIAGNOSIS — J0140 Acute pansinusitis, unspecified: Secondary | ICD-10-CM

## 2019-03-14 DIAGNOSIS — J40 Bronchitis, not specified as acute or chronic: Secondary | ICD-10-CM | POA: Diagnosis not present

## 2019-03-14 MED ORDER — LEVOFLOXACIN 500 MG PO TABS: 500.0000 mg | ORAL_TABLET | Freq: Every day | ORAL | 0 refills | Status: DC

## 2019-03-14 NOTE — Progress Notes (Signed)
Virtual Visit via Video Note  I connected with Martha Hampton on 03/14/19 at  1:30 PM EDT by a video enabled telemedicine application and verified that I am speaking with the correct person using two identifiers.   I discussed the limitations of evaluation and management by telemedicine and the availability of in person appointments. The patient expressed understanding and agreed to proceed.  History of Present Illness: Pt here c/o congestion x 5 weeks.   [facility] and given z pack and sudafed---  No better.  She went to er in [place]--  And they did nothing    Then her brother in law gave her pred taper --- cefuroxime 500 --- 1 po qd ----  She is down to just a few pills   Pt has not had a fever--- + chills  She has been taken tylenol and Ibuprofen regularly.  She is still coughing and has st    Observations/Objective: Pt looks sick-- not toxic No fevers Normal resp   Assessment and Plan: Sinusitis / bronchitis ---- abx per orders       Pt ok to use otc cough med  Call if symptoms worsen or no better  Follow Up Instructions:    I discussed the assessment and treatment plan with the patient. The patient was provided an opportunity to ask questions and all were answered. The patient agreed with the plan and demonstrated an understanding of the instructions.   The patient was advised to call back or seek an in-person evaluation if the symptoms worsen or if the condition fails to improve as anticipated.  I provided 15 minutes of non-face-to-face time during this encounter.   Ann Held, DO

## 2019-03-19 ENCOUNTER — Other Ambulatory Visit: Payer: Self-pay

## 2019-03-19 ENCOUNTER — Encounter (HOSPITAL_BASED_OUTPATIENT_CLINIC_OR_DEPARTMENT_OTHER): Payer: Self-pay | Admitting: Emergency Medicine

## 2019-03-19 ENCOUNTER — Emergency Department (HOSPITAL_BASED_OUTPATIENT_CLINIC_OR_DEPARTMENT_OTHER): Payer: 59

## 2019-03-19 ENCOUNTER — Emergency Department (HOSPITAL_BASED_OUTPATIENT_CLINIC_OR_DEPARTMENT_OTHER)
Admission: EM | Admit: 2019-03-19 | Discharge: 2019-03-19 | Disposition: A | Discharge status: Home / Self Care | Payer: 59 | Attending: Emergency Medicine | Admitting: Emergency Medicine

## 2019-03-19 DIAGNOSIS — R05 Cough: Secondary | ICD-10-CM | POA: Diagnosis not present

## 2019-03-19 DIAGNOSIS — R0600 Dyspnea, unspecified: Secondary | ICD-10-CM | POA: Insufficient documentation

## 2019-03-19 DIAGNOSIS — R079 Chest pain, unspecified: Secondary | ICD-10-CM | POA: Diagnosis not present

## 2019-03-19 DIAGNOSIS — N3000 Acute cystitis without hematuria: Secondary | ICD-10-CM | POA: Insufficient documentation

## 2019-03-19 DIAGNOSIS — R059 Cough, unspecified: Secondary | ICD-10-CM

## 2019-03-19 LAB — URINALYSIS, ROUTINE W REFLEX MICROSCOPIC
Bilirubin Urine: NEGATIVE
Glucose, UA: NEGATIVE mg/dL
Hgb urine dipstick: NEGATIVE
Ketones, ur: 15 mg/dL — AB
NITRITE: NEGATIVE
Protein, ur: NEGATIVE mg/dL
Specific Gravity, Urine: >1.03 — ABNORMAL HIGH (ref 1.005–1.030)
pH: 5.5 (ref 5.0–8.0)

## 2019-03-19 LAB — BASIC METABOLIC PANEL
Anion gap: 11 (ref 5–15)
BUN: 13 mg/dL (ref 6–20)
CHLORIDE: 102 mmol/L (ref 98–111)
CO2: 25 mmol/L (ref 22–32)
Calcium: 9.3 mg/dL (ref 8.9–10.3)
Creatinine, Ser: 0.86 mg/dL (ref 0.44–1.00)
GFR calc Af Amer: >60 mL/min (ref >60)
GFR calc non Af Amer: >60 mL/min (ref >60)
Glucose, Bld: 113 mg/dL — ABNORMAL HIGH (ref 70–99)
Potassium: 3.5 mmol/L (ref 3.5–5.1)
Sodium: 138 mmol/L (ref 135–145)

## 2019-03-19 LAB — D-DIMER, QUANTITATIVE: D-Dimer, Quant: 0.44 ug/mL-FEU (ref 0.00–0.50)

## 2019-03-19 LAB — CBC
HCT: 40.2 % (ref 36.0–46.0)
Hemoglobin: 12.5 g/dL (ref 12.0–15.0)
MCH: 28 pg (ref 26.0–34.0)
MCHC: 31.1 g/dL (ref 30.0–36.0)
MCV: 89.9 fL (ref 80.0–100.0)
Platelets: 235 10*3/uL (ref 150–400)
RBC: 4.47 MIL/uL (ref 3.87–5.11)
RDW: 15.1 % (ref 11.5–15.5)
WBC: 7.4 10*3/uL (ref 4.0–10.5)
nRBC: 0 % (ref 0.0–0.2)

## 2019-03-19 LAB — URINALYSIS, MICROSCOPIC (REFLEX): RBC / HPF: NONE SEEN RBC/hpf (ref 0–5)

## 2019-03-19 LAB — TROPONIN I: Troponin I: <0.03 ng/mL (ref <0.03)

## 2019-03-19 LAB — BRAIN NATRIURETIC PEPTIDE: B Natriuretic Peptide: 21.1 pg/mL (ref 0.0–100.0)

## 2019-03-19 MED ORDER — SODIUM CHLORIDE 0.9% FLUSH
3.0000 mL | Freq: Once | INTRAVENOUS | Status: DC
  Filled 2019-03-19: qty 3

## 2019-03-19 MED ORDER — SULFAMETHOXAZOLE-TRIMETHOPRIM 800-160 MG PO TABS: 1.0000 | ORAL_TABLET | Freq: Two times a day (BID) | ORAL | 0 refills | Status: AC

## 2019-03-19 NOTE — Discharge Instructions (Addendum)
You were evaluated in the Emergency Department and after careful evaluation, we did not find any emergent condition requiring admission or further testing in the hospital.  Your labs today were very reassuring, no signs of heart damage or blood clots and your chest x-ray was completely clear with no evidence of pneumonia.  Your urine does show signs of infection.  Please use the antibiotic provided in addition to your current antibiotic and follow-up closely with your PCP.  Please return to the Emergency Department if you experience any worsening of your condition.  We encourage you to follow up with a primary care provider.  Thank you for allowing [place] to be a part of your care.

## 2019-03-19 NOTE — ED Notes (Signed)
Called lab and added on BNP and Dimer.

## 2019-03-19 NOTE — ED Notes (Signed)
Pt understood dc material. NAD noted. Script given at [facility]. Work excuse given at [facility] [facility]. All questions answered to satisfaction. Pt escorted to check out counter.

## 2019-03-19 NOTE — ED Triage Notes (Signed)
PResents with 6 weeks of URI and bronchitis. She has been on multiple antibiotics for this and two prednisone tapers. She states she still feels SOB and her left side of her chest hurts. IT is worse with inspiration. She is tachycardic. Denies fevers. Endorses 2 days of diarrhea, nausea and vomting a few weeks ago but none since.

## 2019-03-19 NOTE — ED Provider Notes (Addendum)
[facility] Emergency Department Provider Note MRN:  [id number]  Arrival date & time: 03/19/19     Chief Complaint   Chest Pain   History of Present Illness   Martha Hampton is a 53 y.o. year-old female with a history of anxiety, GERD presenting to the ED with chief complaint of chest pain.  Patient has been experiencing persistent cough and chest discomfort for the past 5 to 6 weeks.  Feels like she has trouble breathing when she lays flat at night.  Feels like a pressure sensation on her chest at night.  Has been seen by her PCP multiple times as well as an ED physician in [place].  Has trialed 2 different courses of antibiotics and steroids without relief.  Was told by her PCP that if not better today should go to the emergency department.  Denies fever, no abdominal pain, endorsing mild dysuria for the past 2 days.  Denies numbness or weakness to the arms or legs.  Symptoms are constant.  Review of Systems  A complete 10 system review of systems was obtained and all systems are negative except as noted in the HPI and PMH.   Patient's Health History    Past Medical History:  Diagnosis Date  . Anemia    iron deficiency  . Anxiety   . Genital herpes   . GERD (gastroesophageal reflux disease)   . Hyperlipidemia     Past Surgical History:  Procedure Laterality Date  . [place]   left  . CHOLECYSTECTOMY  11-2005  . COLONOSCOPY  01/06/2013   Procedure: COLONOSCOPY;  Surgeon: Beryle Beams, MD;  Location: [facility];  Service: Gastroenterology;  Laterality: N/A;  . 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 Hypertension Mother   . Cancer Mother        non-dodgkins mantel cell  . Hypertension Father   . Rectal cancer Maternal Grandmother   . Breast cancer Maternal Aunt     Social History   Socioeconomic History  . Marital status: Married    Spouse name: Not on file  . Number of children: Not on file   . Years of education: Not on file  . Highest education level: Not on file  Occupational History  . Not on file  Social Needs  . Financial resource strain: Not on file  . Food insecurity:    Worry: Not on file    Inability: Not on file  . Transportation needs:    Medical: Not on file    Non-medical: Not on file  Tobacco Use  . Smoking status: Never Smoker  . Smokeless tobacco: Never Used  Substance and Sexual Activity  . Alcohol use: No  . Drug use: No  . Sexual activity: Not on file  Lifestyle  . Physical activity:    Days per week: Not on file    Minutes per session: Not on file  . Stress: Not on file  Relationships  . Social connections:    Talks on phone: Not on file    Gets together: Not on file    Attends religious service: Not on file    Active member of club or organization: Not on file    Attends meetings of clubs or organizations: Not on file    Relationship status: Not on file  . Intimate partner violence:    Fear of current or ex partner: Not on file  Emotionally abused: Not on file    Physically abused: Not on file    Forced sexual activity: Not on file  Other Topics Concern  . Not on file  Social History Narrative  . Not on file     Physical Exam  Vital Signs and Nursing Notes reviewed Vitals:   03/19/19 2000 03/19/19 2030  BP: (!) 159/83 (!) 159/79  Pulse: 93 99  Resp: 13 14  Temp:    SpO2: 99% 100%    CONSTITUTIONAL: Well-appearing, NAD NEURO:  Alert and oriented x 3, no focal deficits EYES:  eyes equal and reactive ENT/NECK:  no LAD, no JVD CARDIO: Regular rate, well-perfused, normal S1 and S2 PULM:  CTAB no wheezing or rhonchi GI/GU:  normal bowel sounds, non-distended, non-tender MSK/SPINE:  No gross deformities, no edema SKIN:  no rash, atraumatic PSYCH:  Appropriate speech and behavior  Diagnostic and Interventional Summary    EKG Interpretation  Date/Time:  Sunday March 19 2019 18:42:54 EDT Ventricular Rate:  103 PR  Interval:    QRS Duration: 84 QT Interval:  345 QTC Calculation: 452 R Axis:   62 Text Interpretation:  Sinus tachycardia Confirmed by Gerlene Fee [phone]) on 03/19/2019 8:14:05 PM      Labs Reviewed  BASIC METABOLIC PANEL - Abnormal; Notable for the following components:      Result Value   Glucose, Bld 113 (*)    All other components within normal limits  URINALYSIS, ROUTINE W REFLEX MICROSCOPIC - Abnormal; Notable for the following components:   APPearance CLOUDY (*)    Specific Gravity, Urine >1.030 (*)    Ketones, ur 15 (*)    Leukocytes,Ua SMALL (*)    All other components within normal limits  URINALYSIS, MICROSCOPIC (REFLEX) - Abnormal; Notable for the following components:   Bacteria, UA MANY (*)    All other components within normal limits  CBC  TROPONIN I  BRAIN NATRIURETIC PEPTIDE  D-DIMER, QUANTITATIVE (NOT AT [facility])    DG Chest Portable 1 View  Final Result      Medications  sodium chloride flush (NS) 0.9 % injection 3 mL (3 mLs Intravenous Not Given 03/19/19 1911)     Procedures Critical Care  ED Course and Medical Decision Making  I have reviewed the triage vital signs and the nursing notes.  Pertinent labs & imaging results that were available during my care of the patient were reviewed by me and considered in my medical decision making (see below for details).  Patient has been thoroughly evaluated and treated for pneumonia and/or bronchitis and/or reactive airway disease without relief.  Given her tachycardia, report of orthopnea, also considering CHF or PE.  EKG is without acute concerns, labs are reassuring with a negative troponin, negative d-dimer.  Chest x-ray is clear with no edema, BNP is normal.  Urinalysis with evidence to suggest infection.  Advise close PCP follow-up, prescription for Bactrim.  After the discussed management above, the patient was determined to be safe for discharge.  The patient was in agreement with this plan and all  questions regarding their care were answered.  ED return precautions were discussed and the patient will return to the ED with any significant worsening of condition.  Barth Kirks. Sedonia Small, [place] [email or web address]  Final Clinical Impressions(s) / ED Diagnoses     ICD-10-CM   1. Chest pain, unspecified type R07.9   2. Acute cystitis without hematuria N30.00   3. Cough R05  ED Discharge Orders         Ordered    sulfamethoxazole-trimethoprim (BACTRIM DS,SEPTRA DS) 800-160 MG tablet  2 times daily     03/19/19 2114             Maudie Flakes, MD 03/19/19 2118    Maudie Flakes, MD 03/19/19 2118

## 2019-03-22 ENCOUNTER — Encounter: Payer: Self-pay | Admitting: Family Medicine

## 2019-03-22 ENCOUNTER — Other Ambulatory Visit: Payer: Self-pay

## 2019-03-22 ENCOUNTER — Ambulatory Visit (INDEPENDENT_AMBULATORY_CARE_PROVIDER_SITE_OTHER): Payer: 59 | Admitting: Family Medicine

## 2019-03-22 ENCOUNTER — Telehealth: Payer: Self-pay

## 2019-03-22 DIAGNOSIS — J40 Bronchitis, not specified as acute or chronic: Secondary | ICD-10-CM

## 2019-03-22 DIAGNOSIS — N390 Urinary tract infection, site not specified: Secondary | ICD-10-CM

## 2019-03-22 HISTORY — DX: Urinary tract infection, site not specified: N39.0

## 2019-03-22 HISTORY — DX: Bronchitis, not specified as acute or chronic: J40

## 2019-03-22 NOTE — Progress Notes (Signed)
Virtual Visit via Video Note  I connected with Martha Hampton on 03/22/19 at 10:45 AM EDT by a video enabled telemedicine application and verified that I am speaking with the correct person using two identifiers.   I discussed the limitations of evaluation and management by telemedicine and the availability of in person appointments. The patient expressed understanding and agreed to proceed.  History of Present Illness: Pt is home and is feeling much better ---  No more fevers  The er gave her bactrim for uti-- she is still taking that  No other complaints.   Er visit reviewed    Observations/Objective: Pt is in NAD Afebrile  rr normal  Assessment and Plan: 1. Bronchitis Resolved Pt can return to work  2. Urinary tract infection without hematuria, site unspecified On bactrim  rto if symptoms persist for ua and culture   Follow Up Instructions:    I discussed the assessment and treatment plan with the patient. The patient was provided an opportunity to ask questions and all were answered. The patient agreed with the plan and demonstrated an understanding of the instructions.   The patient was advised to call back or seek an in-person evaluation if the symptoms worsen or if the condition fails to improve as anticipated.  I provided 15 minutes of non-face-to-face time during this encounter.   Ann Held, DO

## 2019-03-22 NOTE — Assessment & Plan Note (Signed)
Resolved Note written to return to work

## 2019-03-22 NOTE — Telephone Encounter (Signed)
Virtual visit today 

## 2019-03-22 NOTE — Assessment & Plan Note (Signed)
Pt on bactrim rto for ua and culture if symptoms persist but pt seems to be getting better

## 2019-03-22 NOTE — Telephone Encounter (Signed)
Copied from [place] [phone]. Topic: Appointment Scheduling - Prior Auth Required for Appointment >> Mar 21, 2019  4:36 PM [name] wrote: Patient called and said she was seen at [facility] [place] on 3/29. She was advised to go if not better by Dr Carollee Herter. She said she can not be released to work unless it is by Dr Carollee Herter. The ED provider did not release her because she still had a low grade fever and scratchy throat when she was discharged. She said she is on another antibiotic and has enough to get her to tomorrow. She would like another virtual visit with Dr Carollee Herter for tomorrow. She said for someone to please call her in the morning.

## 2019-04-19 ENCOUNTER — Telehealth: Payer: Self-pay | Admitting: Family Medicine

## 2019-04-19 NOTE — Telephone Encounter (Signed)
Copied from [place] [id number]. Topic: Quick Communication - See Telephone Encounter >> Apr 19, 2019 10:35 AM Nils Flack wrote: CRM for notification. See Telephone encounter for: 04/19/19. [facility] [place]  Cb is [phone] or [phone] Pt is requesting a rx that makes you sick when you drink alcohol.  She has had 2 visits recently and wanted to know if this can be called in.  Pt is willing to do visit, wanted to know if it can be sent in without one. Please cal back

## 2019-04-19 NOTE — Telephone Encounter (Signed)
We did not talk about the alcohol---- I would need to talk to her about it

## 2019-04-20 NOTE — Telephone Encounter (Signed)
Please call [phone] She would like to set up appt

## 2019-04-21 ENCOUNTER — Ambulatory Visit (INDEPENDENT_AMBULATORY_CARE_PROVIDER_SITE_OTHER): Payer: 59 | Admitting: Family Medicine

## 2019-04-21 ENCOUNTER — Encounter: Payer: Self-pay | Admitting: Family Medicine

## 2019-04-21 ENCOUNTER — Other Ambulatory Visit: Payer: Self-pay

## 2019-04-21 DIAGNOSIS — F429 Obsessive-compulsive disorder, unspecified: Secondary | ICD-10-CM

## 2019-04-21 DIAGNOSIS — F1010 Alcohol abuse, uncomplicated: Secondary | ICD-10-CM

## 2019-04-21 HISTORY — DX: Alcohol abuse, uncomplicated: F10.10

## 2019-04-21 HISTORY — DX: Obsessive-compulsive disorder, unspecified: F42.9

## 2019-04-21 NOTE — Telephone Encounter (Signed)
Patient scheduled for today

## 2019-04-21 NOTE — Assessment & Plan Note (Signed)
Recommended [facility] because it is in [place] [facility] Pt aware and will look into it

## 2019-04-21 NOTE — Progress Notes (Signed)
Virtual Visit via Video Note  I connected with Martha Hampton on 04/21/19 at  1:45 PM EDT by a video enabled telemedicine application and verified that I am speaking with the correct person using two identifiers.  Location: Patient: car Provider:office   I discussed the limitations of evaluation and management by telemedicine and the availability of in person appointments. The patient expressed understanding and agreed to proceed.  History of Present Illness: Pt is home with c/o increased alcohol consumption since her divorce.  She is now remarried but still can't wait to get home and drink wine.   her new husband is frustrated becaue she is drunk every night.   She only drinks at night.    Past Medical History:  Diagnosis Date  . Anemia    iron deficiency  . Anxiety   . Genital herpes   . GERD (gastroesophageal reflux disease)   . Hyperlipidemia    Outpatient Encounter Medications as of 04/21/2019  Medication Sig  . B Complex Vitamins (B COMPLEX 1 PO) Take by mouth daily.    . cetirizine (ZYRTEC) 10 MG tablet Take 10 mg by mouth daily.    . cyanocobalamin (,VITAMIN B-12,) 1000 MCG/ML injection 1 ml sq weeklyx 3 weekl then 1 ml sq monthly  . furosemide (LASIX) 40 MG tablet Take 1 tablet (40 mg total) by mouth daily.  Marland Kitchen levofloxacin (LEVAQUIN) 500 MG tablet Take 1 tablet (500 mg total) by mouth daily.  . meloxicam (MOBIC) 15 MG tablet TAKE 1/2 TO 1 TABLET BY MOUTH EVERY DAY AS NEEDED  . PARoxetine (PAXIL) 40 MG tablet Take 1 tablet (40 mg total) by mouth every morning.  . pregabalin (LYRICA) 50 MG capsule Take 1 capsule (50 mg total) by mouth 3 (three) times daily.  . valACYclovir (VALTREX) 500 MG tablet Take 1 tablet (500 mg total) by mouth daily.  . Vitamin D, Ergocalciferol, (DRISDOL) 50000 UNITS CAPS Take 50,000 Units by mouth every 7 (seven) days. Reported on 06/15/2016   Facility-Administered Encounter Medications as of 04/21/2019  Medication  . cyanocobalamin ((VITAMIN B-12))  injection 1,000 mcg   Observations/Objective: .no vitals obtained due to pt being in her car Pt is in NAD  Assessment and Plan: 1. Obsessive-compulsive disorder, unspecified type Take 1 tab of paxil --- she has been taking half  F/u in 1 month or sooner prn  - Ambulatory referral to Psychiatry  2. Alcohol abuse Recommend pt see psych for counseling ---  She can walk into [facility] if needed  - Ambulatory referral to Psychiatry  Follow Up Instructions:    I discussed the assessment and treatment plan with the patient. The patient was provided an opportunity to ask questions and all were answered. The patient agreed with the plan and demonstrated an understanding of the instructions.   The patient was advised to call back or seek an in-person evaluation if the symptoms worsen or if the condition fails to improve as anticipated.  I provided 20 minutes of non-face-to-face time during this encounter.   Ann Held, DO

## 2019-09-29 ENCOUNTER — Other Ambulatory Visit: Payer: Self-pay

## 2019-09-29 ENCOUNTER — Encounter: Payer: Self-pay | Admitting: Family Medicine

## 2019-09-29 ENCOUNTER — Ambulatory Visit (INDEPENDENT_AMBULATORY_CARE_PROVIDER_SITE_OTHER): Payer: 59 | Admitting: Family Medicine

## 2019-09-29 DIAGNOSIS — J0140 Acute pansinusitis, unspecified: Secondary | ICD-10-CM

## 2019-09-29 DIAGNOSIS — Z20822 Contact with and (suspected) exposure to covid-19: Secondary | ICD-10-CM

## 2019-09-29 DIAGNOSIS — Z20828 Contact with and (suspected) exposure to other viral communicable diseases: Secondary | ICD-10-CM | POA: Diagnosis not present

## 2019-09-29 DIAGNOSIS — J40 Bronchitis, not specified as acute or chronic: Secondary | ICD-10-CM | POA: Diagnosis not present

## 2019-09-29 HISTORY — DX: Contact with and (suspected) exposure to covid-19: Z20.822

## 2019-09-29 MED ORDER — FLUTICASONE PROPIONATE 50 MCG/ACT NA SUSP: 2.0000 | Freq: Every day | NASAL | 6 refills | Status: DC

## 2019-09-29 MED ORDER — AZITHROMYCIN 250 MG PO TABS: ORAL_TABLET | ORAL | 0 refills | Status: DC

## 2019-09-29 NOTE — Progress Notes (Signed)
Virtual Visit via Video Note  I connected with Martha Hampton on 09/29/19 at  2:20 PM EDT by a video enabled telemedicine application and verified that I am speaking with the correct person using two identifiers.  Location: Patient: home  Provider: office    I discussed the limitations of evaluation and management by telemedicine and the availability of in person appointments. The patient expressed understanding and agreed to proceed.  History of Present Illness: Pt is home c/o exposure to covid -- her coworker tested positive and she got tested this am.  She now has 99.5 temp, cough and sinus congestion and drainage    She also has sores in her nose  She also has a scratchy throat    Observations/Objective: ..temp 99.5  No other vitals Pt in NAD Assessment and Plan: 1. Bronchitis z pack covid test pending Quarantine at home  - azithromycin (ZITHROMAX Z-PAK) 250 MG tablet; As directed  Dispense: 6 each; Refill: 0  2. Acute non-recurrent pansinusitis  - azithromycin (ZITHROMAX Z-PAK) 250 MG tablet; As directed  Dispense: 6 each; Refill: 0 - fluticasone (FLONASE) 50 MCG/ACT nasal spray; Place 2 sprays into both nostrils daily.  Dispense: 16 g; Refill: 6  Follow Up Instructions:    I discussed the assessment and treatment plan with the patient. The patient was provided an opportunity to ask questions and all were answered. The patient agreed with the plan and demonstrated an understanding of the instructions.   The patient was advised to call back or seek an in-person evaluation if the symptoms worsen or if the condition fails to improve as anticipated.  I provided 15 minutes of non-face-to-face time during this encounter.   Ann Held, DO

## 2019-10-20 ENCOUNTER — Telehealth (HOSPITAL_COMMUNITY): Payer: Self-pay | Admitting: Licensed Clinical Social Worker

## 2019-11-10 ENCOUNTER — Ambulatory Visit (INDEPENDENT_AMBULATORY_CARE_PROVIDER_SITE_OTHER): Payer: 59 | Admitting: Family Medicine

## 2019-11-10 ENCOUNTER — Other Ambulatory Visit: Payer: Self-pay

## 2019-11-10 ENCOUNTER — Encounter: Payer: Self-pay | Admitting: Family Medicine

## 2019-11-10 DIAGNOSIS — Z20828 Contact with and (suspected) exposure to other viral communicable diseases: Secondary | ICD-10-CM

## 2019-11-10 DIAGNOSIS — R197 Diarrhea, unspecified: Secondary | ICD-10-CM

## 2019-11-10 DIAGNOSIS — Z20822 Contact with and (suspected) exposure to covid-19: Secondary | ICD-10-CM

## 2019-11-10 DIAGNOSIS — R062 Wheezing: Secondary | ICD-10-CM

## 2019-11-10 DIAGNOSIS — J069 Acute upper respiratory infection, unspecified: Secondary | ICD-10-CM

## 2019-11-10 MED ORDER — BENZONATATE 100 MG PO CAPS: 100.0000 mg | ORAL_CAPSULE | Freq: Three times a day (TID) | ORAL | 0 refills | Status: DC | PRN

## 2019-11-10 MED ORDER — PREDNISONE 20 MG PO TABS: 40.0000 mg | ORAL_TABLET | Freq: Every day | ORAL | 0 refills | Status: AC

## 2019-11-10 NOTE — Progress Notes (Signed)
Chief Complaint  Patient presents with  . Cough    exposure to [place] here for URI complaints. Due to COVID-19 pandemic, we are interacting via web portal for an electronic face-to-face visit. I verified patient's ID using 2 identifiers. Patient agreed to proceed with visit via this method. Patient is at home, I am at office. Patient and I are present for visit.   Duration: 10 hrs  Associated symptoms: subjective fever, rhinorrhea, wheezing, shortness of breath, myalgia and cough, diarrhea Denies: sinus pain, itchy watery eyes, ear pain, ear drainage, sore throat and N/V Treatment to date: SABA Sick contacts: Yes- exposed to covid  ROS:  HEENT: As noted in HPI Lungs: +cough  Past Medical History:  Diagnosis Date  . Anemia    iron deficiency  . Anxiety   . Genital herpes   . GERD (gastroesophageal reflux disease)   . Hyperlipidemia    Exam No conversational dyspnea Age appropriate judgment and insight Nml affect and mood  Exposure to COVID-19 virus  Wheezing - Plan: predniSONE (DELTASONE) 20 MG tablet  Upper respiratory tract infection, unspecified type - Plan: benzonatate (TESSALON) 100 MG capsule  Diarrhea, unspecified type  Act as if she has covid until proven otherwise. Testing on Mon which she already has set up. Meds as above. Warnings on when to go to ED discussed.  Continue to push fluids, practice good hand hygiene, cover mouth when coughing. F/u prn. If starting to experience fevers, shaking, or shortness of breath, seek immediate care. Pt voiced understanding and agreement to the plan.  [place], DO 11/10/19 1:26 PM

## 2019-11-28 ENCOUNTER — Telehealth: Payer: Self-pay | Admitting: *Deleted

## 2019-11-28 DIAGNOSIS — F332 Major depressive disorder, recurrent severe without psychotic features: Secondary | ICD-10-CM

## 2019-11-28 MED ORDER — PAROXETINE HCL 40 MG PO TABS: 40.0000 mg | ORAL_TABLET | ORAL | 1 refills | Status: DC

## 2019-11-28 NOTE — Telephone Encounter (Signed)
Received request from [facility] for paxil 40mg . REfill sent.

## 2020-04-03 IMAGING — DX PORTABLE CHEST - 1 VIEW
1 series · 1 of 1 positions shown · non-contrast
Comparison: Chest radiograph dated 04/19/2007

CLINICAL DATA: 52-year-old female with cough and chest pain and
shortness of breath.

EXAM:
PORTABLE CHEST 1 VIEW

[chest ap]
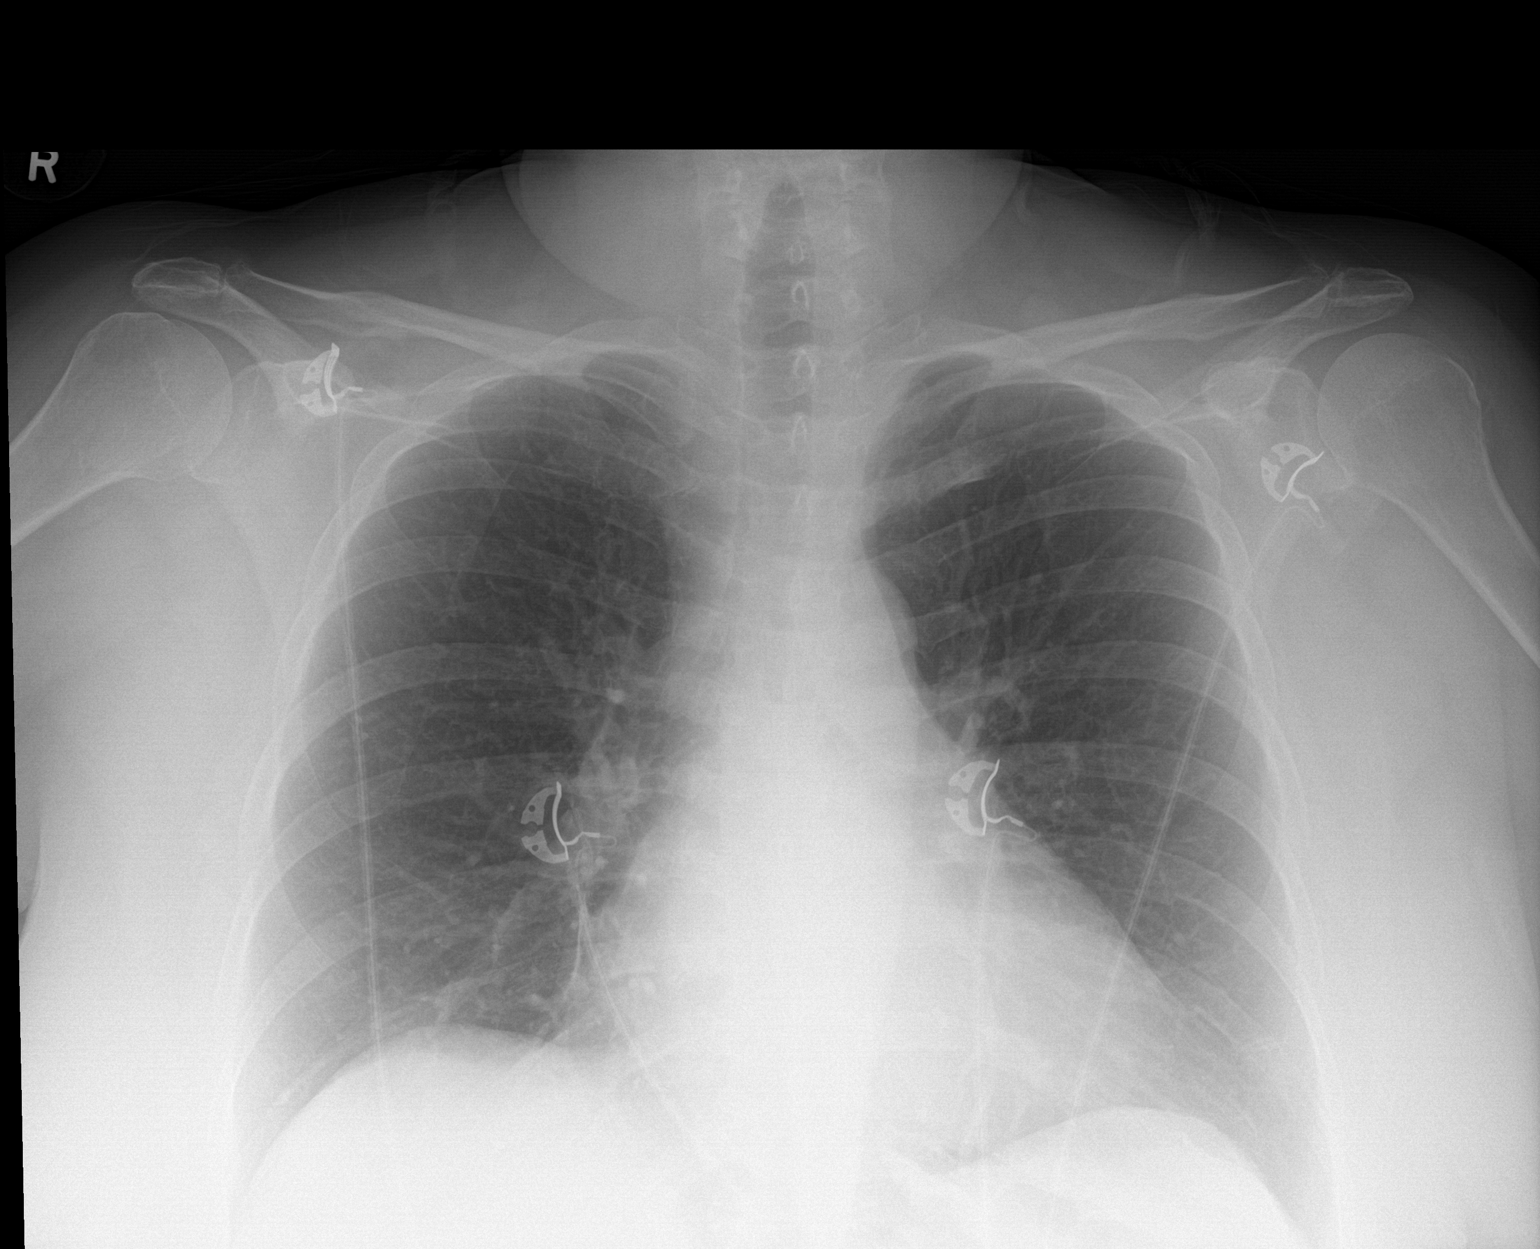

[1 of 1 positions shown; findings below may reference images not displayed]

FINDINGS: The heart size and mediastinal contours are within normal limits.
Both lungs are clear. The visualized skeletal structures are
unremarkable.
IMPRESSION: No active disease.

## 2020-06-28 ENCOUNTER — Emergency Department (HOSPITAL_BASED_OUTPATIENT_CLINIC_OR_DEPARTMENT_OTHER)
Admission: EM | Admit: 2020-06-28 | Discharge: 2020-06-28 | Disposition: A | Discharge status: Home / Self Care | Payer: 59 | Attending: Emergency Medicine | Admitting: Emergency Medicine

## 2020-06-28 ENCOUNTER — Other Ambulatory Visit: Payer: Self-pay

## 2020-06-28 DIAGNOSIS — B9689 Other specified bacterial agents as the cause of diseases classified elsewhere: Secondary | ICD-10-CM | POA: Diagnosis not present

## 2020-06-28 DIAGNOSIS — R110 Nausea: Secondary | ICD-10-CM | POA: Diagnosis not present

## 2020-06-28 DIAGNOSIS — N3001 Acute cystitis with hematuria: Secondary | ICD-10-CM | POA: Insufficient documentation

## 2020-06-28 DIAGNOSIS — R5383 Other fatigue: Secondary | ICD-10-CM | POA: Diagnosis not present

## 2020-06-28 DIAGNOSIS — R61 Generalized hyperhidrosis: Secondary | ICD-10-CM | POA: Diagnosis not present

## 2020-06-28 DIAGNOSIS — E876 Hypokalemia: Secondary | ICD-10-CM | POA: Diagnosis not present

## 2020-06-28 DIAGNOSIS — R002 Palpitations: Secondary | ICD-10-CM | POA: Diagnosis present

## 2020-06-28 DIAGNOSIS — N3000 Acute cystitis without hematuria: Secondary | ICD-10-CM

## 2020-06-28 LAB — CBC
HCT: 37.8 % (ref 36.0–46.0)
Hemoglobin: 11.6 g/dL — ABNORMAL LOW (ref 12.0–15.0)
MCH: 25.1 pg — ABNORMAL LOW (ref 26.0–34.0)
MCHC: 30.7 g/dL (ref 30.0–36.0)
MCV: 81.6 fL (ref 80.0–100.0)
Platelets: 278 10*3/uL (ref 150–400)
RBC: 4.63 MIL/uL (ref 3.87–5.11)
RDW: 18.7 % — ABNORMAL HIGH (ref 11.5–15.5)
WBC: 4.9 10*3/uL (ref 4.0–10.5)
nRBC: 0 % (ref 0.0–0.2)

## 2020-06-28 LAB — URINALYSIS, ROUTINE W REFLEX MICROSCOPIC
Bilirubin Urine: NEGATIVE
Glucose, UA: NEGATIVE mg/dL
Hgb urine dipstick: NEGATIVE
Ketones, ur: NEGATIVE mg/dL
Nitrite: NEGATIVE
Protein, ur: NEGATIVE mg/dL
Specific Gravity, Urine: 1.015 (ref 1.005–1.030)
pH: 6 (ref 5.0–8.0)

## 2020-06-28 LAB — TROPONIN I (HIGH SENSITIVITY): Troponin I (High Sensitivity): 6 ng/L (ref <18)

## 2020-06-28 LAB — BASIC METABOLIC PANEL
Anion gap: 11 (ref 5–15)
BUN: 5 mg/dL — ABNORMAL LOW (ref 6–20)
CO2: 26 mmol/L (ref 22–32)
Calcium: 8.5 mg/dL — ABNORMAL LOW (ref 8.9–10.3)
Chloride: 98 mmol/L (ref 98–111)
Creatinine, Ser: 0.79 mg/dL (ref 0.44–1.00)
GFR calc Af Amer: >60 mL/min (ref >60)
GFR calc non Af Amer: >60 mL/min (ref >60)
Glucose, Bld: 117 mg/dL — ABNORMAL HIGH (ref 70–99)
Potassium: 2.9 mmol/L — ABNORMAL LOW (ref 3.5–5.1)
Sodium: 135 mmol/L (ref 135–145)

## 2020-06-28 LAB — URINALYSIS, MICROSCOPIC (REFLEX)

## 2020-06-28 LAB — TSH: TSH: 1.297 u[IU]/mL (ref 0.350–4.500)

## 2020-06-28 LAB — MAGNESIUM: Magnesium: 1.5 mg/dL — ABNORMAL LOW (ref 1.7–2.4)

## 2020-06-28 MED ORDER — CEPHALEXIN 500 MG PO CAPS: 500.0000 mg | ORAL_CAPSULE | Freq: Four times a day (QID) | ORAL | 0 refills | Status: AC

## 2020-06-28 MED ORDER — POTASSIUM CHLORIDE ER 10 MEQ PO TBCR: 10.0000 meq | EXTENDED_RELEASE_TABLET | Freq: Every day | ORAL | 0 refills | Status: DC

## 2020-06-28 MED ORDER — FLUCONAZOLE 150 MG PO TABS: 150.0000 mg | ORAL_TABLET | Freq: Every day | ORAL | 0 refills | Status: AC

## 2020-06-28 MED ORDER — POTASSIUM CHLORIDE CRYS ER 20 MEQ PO TBCR
40.0000 meq | EXTENDED_RELEASE_TABLET | Freq: Once | ORAL | Status: AC
  Administered 2020-06-28: 40 meq via ORAL
  Filled 2020-06-28: qty 2
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
## 2020-06-28 NOTE — Discharge Instructions (Signed)
Follow-up with your doctor on Monday for repeat potassium checks.  Come back with any concerning changes.  The antibiotic for the UTI, the Diflucan was provided in case you get a yeast infection.

## 2020-06-28 NOTE — ED Triage Notes (Signed)
Pt states has been having episodes of palpitations over past month, along with weakness on exertion, Denies chest pain.  Denies cough, denies fever.

## 2020-06-28 NOTE — ED Notes (Signed)
ED Provider at bedside. 

## 2020-11-25 ENCOUNTER — Emergency Department (HOSPITAL_BASED_OUTPATIENT_CLINIC_OR_DEPARTMENT_OTHER)
Admission: EM | Admit: 2020-11-25 | Discharge: 2020-11-25 | Disposition: A | Discharge status: Home / Self Care | Payer: 59 | Attending: Emergency Medicine | Admitting: Emergency Medicine

## 2020-11-25 ENCOUNTER — Encounter (HOSPITAL_BASED_OUTPATIENT_CLINIC_OR_DEPARTMENT_OTHER): Payer: Self-pay

## 2020-11-25 ENCOUNTER — Telehealth: Payer: Self-pay | Admitting: Family Medicine

## 2020-11-25 ENCOUNTER — Other Ambulatory Visit: Payer: Self-pay

## 2020-11-25 ENCOUNTER — Emergency Department (HOSPITAL_BASED_OUTPATIENT_CLINIC_OR_DEPARTMENT_OTHER): Payer: 59

## 2020-11-25 DIAGNOSIS — R0602 Shortness of breath: Secondary | ICD-10-CM | POA: Diagnosis present

## 2020-11-25 DIAGNOSIS — R202 Paresthesia of skin: Secondary | ICD-10-CM | POA: Insufficient documentation

## 2020-11-25 DIAGNOSIS — R200 Anesthesia of skin: Secondary | ICD-10-CM

## 2020-11-25 LAB — CBC WITH DIFFERENTIAL/PLATELET
Abs Immature Granulocytes: 0.06 10*3/uL (ref 0.00–0.07)
Basophils Absolute: 0 10*3/uL (ref 0.0–0.1)
Basophils Relative: 0 %
Eosinophils Absolute: 0 10*3/uL (ref 0.0–0.5)
Eosinophils Relative: 0 %
HCT: 38 % (ref 36.0–46.0)
Hemoglobin: 12.1 g/dL (ref 12.0–15.0)
Immature Granulocytes: 1 %
Lymphocytes Relative: 15 %
Lymphs Abs: 1.1 10*3/uL (ref 0.7–4.0)
MCH: 27.6 pg (ref 26.0–34.0)
MCHC: 31.8 g/dL (ref 30.0–36.0)
MCV: 86.6 fL (ref 80.0–100.0)
Monocytes Absolute: 0.2 10*3/uL (ref 0.1–1.0)
Monocytes Relative: 3 %
Neutro Abs: 6.2 10*3/uL (ref 1.7–7.7)
Neutrophils Relative %: 81 %
Platelets: 309 10*3/uL (ref 150–400)
RBC: 4.39 MIL/uL (ref 3.87–5.11)
RDW: 16.6 % — ABNORMAL HIGH (ref 11.5–15.5)
WBC: 7.6 10*3/uL (ref 4.0–10.5)
nRBC: 0 % (ref 0.0–0.2)

## 2020-11-25 LAB — BASIC METABOLIC PANEL
Anion gap: 11 (ref 5–15)
BUN: 10 mg/dL (ref 6–20)
CO2: 26 mmol/L (ref 22–32)
Calcium: 9.2 mg/dL (ref 8.9–10.3)
Chloride: 102 mmol/L (ref 98–111)
Creatinine, Ser: 0.75 mg/dL (ref 0.44–1.00)
GFR, Estimated: >60 mL/min (ref >60)
Glucose, Bld: 133 mg/dL — ABNORMAL HIGH (ref 70–99)
Potassium: 3.6 mmol/L (ref 3.5–5.1)
Sodium: 139 mmol/L (ref 135–145)

## 2020-11-25 LAB — TROPONIN I (HIGH SENSITIVITY): Troponin I (High Sensitivity): 5 ng/L (ref <18)

## 2020-11-25 LAB — MAGNESIUM: Magnesium: 1.5 mg/dL — ABNORMAL LOW (ref 1.7–2.4)

## 2020-11-25 MED ORDER — MAGNESIUM OXIDE 400 (241.3 MG) MG PO TABS
400.0000 mg | ORAL_TABLET | Freq: Once | ORAL | Status: AC
  Administered 2020-11-25: 400 mg via ORAL
  Filled 2020-11-25: qty 1

## 2020-11-25 MED ORDER — IOHEXOL 350 MG/ML SOLN
100.0000 mL | Freq: Once | INTRAVENOUS | Status: AC | PRN
  Administered 2020-11-25: 100 mL via INTRAVENOUS

## 2020-11-25 MED ORDER — SODIUM CHLORIDE 0.9 % IV BOLUS
500.0000 mL | Freq: Once | INTRAVENOUS | Status: AC
  Administered 2020-11-25: 500 mL via INTRAVENOUS
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
## 2020-11-25 NOTE — Discharge Instructions (Addendum)
Seen here for shortness of breath and numbness in your hands. Imaging looks reassuring, lab work looks reassuring, you did have noted low magnesium, possible this could be causing your numbness in her hands.  Recommend following up with your PCP for further evaluation management.  Come back to the emergency department if you develop chest pain, shortness of breath, severe abdominal pain, uncontrolled nausea, vomiting, diarrhea.

## 2020-11-25 NOTE — Telephone Encounter (Signed)
Nurse Assessment Nurse: Hardin Negus, RN, Mardene Celeste Date/Time Eilene Ghazi Time): 11/25/2020 10:15:50 AM Confirm and document reason for call. If symptomatic, describe symptoms. ---Caller states she is having chest discomfort feels when she takes a deep breath. no feeling like they are asleep She has antibody invusion for COVID. She was dx with covid last week. Her blood pressure has been higher than normal . The numbness in her fingers some and goes. no other s&s She does have some heart fluttering The MD is aware of that. Does the patient have any new or worsening symptoms? ---Yes Will a triage be completed? ---Yes Related visit to physician within the last 2 weeks? ---No Does the PT have any chronic conditions? (i.e. diabetes, asthma, this includes High risk factors for pregnancy, etc.) ---Yes List chronic conditions. ---depression Is the patient pregnant or possibly pregnant? (Ask all females between the ages of 28-55) ---No Is this a behavioral health or substance abuse call? ---No Guidelines Guideline Title Affirmed Question Affirmed Notes Nurse Date/Time (Eastern Time) Hand and Wrist Pain Patient sounds very sick or weak to the triager Hardin Negus, RN, Mardene Celeste 11/25/2020 10:18:51 AM Disp. Time Eilene Ghazi Time) Disposition Final User 11/25/2020 10:24:27 AM Go to ED Now (or PCP triage) Yes Hardin Negus, RN, Harlon Flor NOTE: All timestamps contained within this report are represented as [place] Standard Time. CONFIDENTIALTY NOTICE: This fax transmission is intended only for the addressee. It contains information that is legally privileged, confidential or otherwise protected from use or disclosure. If you are not the intended recipient, you are strictly prohibited from reviewing, disclosing, copying using or disseminating any of this information or taking any action in reliance on or regarding this information. If you have received this fax in error, please notify us immediately by telephone so  that we can arrange for its return to [place]. Phone: [phone], Toll-Free: [phone], Fax: [phone] Page: 2 of 2 Call Id: [id number] [place] Disagree/Comply Comply Caller Understands Yes PreDisposition Call Doctor Care Advice Given Per Guideline GO TO ED NOW (OR PCP TRIAGE): CARE ADVICE given per Hand and Wrist Pain (Adult) guideline. ANOTHER ADULT SHOULD DRIVE: Referrals GO TO FACILITY UNDECIDED  Pt in ED.

## 2020-11-25 NOTE — Telephone Encounter (Signed)
Caller Norabelle Kondo  Call Back # [phone]  Patient states she was dx with covid- 19 and quarantined now she has returned to work today and experiing chest  discomfort and arm numbness.  Please advise

## 2020-11-25 NOTE — ED Notes (Signed)
Patient transported to CT 

## 2020-11-25 NOTE — ED Triage Notes (Signed)
Pt arrives with c/o feeling like her hands have been going numb and feeling that her heart is racing. Pt states this happens every morning. She did have Covid was told by the health department that she was cleared as of this past Saturday. Pt did use her albuterol inhaler this morning for some SOB and reports feeling some pain in left back and ribs. Also reports taking prednisone PTA (last dose today) and has been taking Musinex DM.

## 2020-11-25 NOTE — Telephone Encounter (Signed)
Pt currently in the ED.

## 2020-11-25 NOTE — ED Notes (Signed)
ED Provider at bedside. 

## 2020-11-25 NOTE — ED Notes (Signed)
PLACED IN SUPINE POSITION FOR ORTHOSTATIC VS EVALUATION

## 2021-01-13 ENCOUNTER — Other Ambulatory Visit: Payer: Self-pay | Admitting: Family Medicine

## 2021-01-13 DIAGNOSIS — F332 Major depressive disorder, recurrent severe without psychotic features: Secondary | ICD-10-CM

## 2021-04-24 ENCOUNTER — Other Ambulatory Visit: Payer: Self-pay

## 2021-04-24 DIAGNOSIS — F332 Major depressive disorder, recurrent severe without psychotic features: Secondary | ICD-10-CM

## 2021-04-24 MED ORDER — PAROXETINE HCL 40 MG PO TABS: 40.0000 mg | ORAL_TABLET | ORAL | 0 refills | Status: DC

## 2021-04-25 ENCOUNTER — Ambulatory Visit: Payer: 59 | Admitting: Family Medicine

## 2021-05-06 ENCOUNTER — Other Ambulatory Visit: Payer: Self-pay

## 2021-05-06 ENCOUNTER — Ambulatory Visit: Payer: 59 | Admitting: Family Medicine

## 2021-05-06 ENCOUNTER — Encounter: Payer: Self-pay | Admitting: Family Medicine

## 2021-05-06 VITALS — BP 160/61 | HR 86 | Temp 98.8°F | Resp 12 | Ht 63.0 in | Wt 221.2 lb

## 2021-05-06 DIAGNOSIS — F332 Major depressive disorder, recurrent severe without psychotic features: Secondary | ICD-10-CM

## 2021-05-06 DIAGNOSIS — F418 Other specified anxiety disorders: Secondary | ICD-10-CM

## 2021-05-06 DIAGNOSIS — I10 Essential (primary) hypertension: Secondary | ICD-10-CM

## 2021-05-06 DIAGNOSIS — E785 Hyperlipidemia, unspecified: Secondary | ICD-10-CM

## 2021-05-06 DIAGNOSIS — R002 Palpitations: Secondary | ICD-10-CM

## 2021-05-06 HISTORY — DX: Essential (primary) hypertension: I10

## 2021-05-06 MED ORDER — PAROXETINE HCL 40 MG PO TABS: 40.0000 mg | ORAL_TABLET | ORAL | 3 refills | Status: DC

## 2021-05-06 MED ORDER — HYDROCHLOROTHIAZIDE 25 MG PO TABS: 25.0000 mg | ORAL_TABLET | Freq: Every day | ORAL | 3 refills | Status: DC

## 2021-05-06 NOTE — Patient Instructions (Addendum)
Major Depressive Disorder, Adult Major depressive disorder (MDD) is a mental health condition. It may also be called clinical depression or unipolar depression. MDD causes symptoms of sadness, hopelessness, and loss of interest in things. These symptoms last most of the day, almost every day, for 2 weeks. MDD can also cause physical symptoms. It can interfere with relationships and with everyday activities, such as work, school, and activities that are usually pleasant. MDD may be mild, moderate, or severe. It may be single-episode MDD, which happens once, or recurrent MDD, which may occur multiple times. What are the causes? The exact cause of this condition is not known. MDD is most likely caused by a combination of things, which may include:  Your personality traits.  Learned or conditioned behaviors or thoughts or feelings that reinforce negativity.  Any alcohol or substance misuse.  Long-term (chronic) physical or mental health illness.  Going through a traumatic experience or major life changes. What increases the risk? The following factors may make someone more likely to develop MDD:  A family history of depression.  Being a woman.  Troubled family relationships.  Abnormally low levels of certain brain chemicals.  Traumatic or painful events in childhood, especially abuse or loss of a parent.  A lot of stress from life experiences, such as poor living conditions or discrimination.  Chronic physical illness or other mental health disorders. What are the signs or symptoms? The main symptoms of MDD usually include:  Constant depressed or irritable mood.  A loss of interest in things and activities. Other symptoms include:  Sleeping or eating too much or too little.  Unexplained weight gain or weight loss.  Tiredness or low energy.  Being agitated, restless, or weak.  Feeling hopeless, worthless, or guilty.  Trouble thinking clearly or making  decisions.  Thoughts of suicide or thoughts of harming others.  Isolating oneself or avoiding other people or activities.  Trouble completing tasks, work, or any normal obligations. Severe symptoms of this condition may include:  Psychotic depression.This may include false beliefs, or delusions. It may also include seeing, hearing, tasting, smelling, or feeling things that are not real (hallucinations).  Chronic depression or persistent depressive disorder. This is low-level depression that lasts for at least 2 years.  Melancholic depression, or feeling extremely sad and hopeless.  Catatonic depression, which includes trouble speaking and trouble moving. How is this diagnosed? This condition may be diagnosed based on:  Your symptoms.  Your medical and mental health history. You may be asked questions about your lifestyle, including any drug and alcohol use.  A physical exam.  Blood tests to rule out other conditions. MDD is confirmed if you have the following symptoms most of the day, nearly every day, in a 2-week period:  Either a depressed mood or loss of interest.  At least four other MDD symptoms. How is this treated? This condition is usually treated by mental health professionals, such as psychologists, psychiatrists, and clinical social workers. You may need more than one type of treatment. Treatment may include:  Psychotherapy, also called talk therapy or counseling. Types of psychotherapy include: ? Cognitive behavioral therapy (CBT). This teaches you to recognize unhealthy feelings, thoughts, and behaviors, and replace them with positive thoughts and actions. ? Interpersonal therapy (IPT). This helps you to improve the way you communicate with others or relate to them. ? Family therapy. This treatment includes members of your family.  Medicines to treat anxiety and depression. These medicines help to balance the brain chemicals   that affect your emotions.  Lifestyle  changes. You may be asked to: ? Limit alcohol use and avoid drug use. ? Get regular exercise. ? Get plenty of sleep. ? Make healthy eating choices. ? Spend more time outdoors.  Brain stimulation. This may be done if symptoms are very severe and other treatments have not worked. Examples of this treatment are electroconvulsive therapy and transcranial magnetic stimulation. Follow these instructions at home: Activity  Exercise regularly and spend time outdoors.  Find activities that you enjoy doing, and make time to do them.  Find healthy ways to manage stress, such as: ? Meditation or deep breathing. ? Spending time in nature. ? Journaling.  Return to your normal activities as told by your health care provider. Ask your health care provider what activities are safe for you. Alcohol and drug use  If you drink alcohol: ? Limit how much you use to:  0-1 drink a day for women who are not pregnant.  0-2 drinks a day for men. ? Be aware of how much alcohol is in your drink. In the [place] one drink equals one 12 oz bottle of beer (355 mL), one 5 oz glass of wine (148 mL), or one 1 oz glass of hard liquor (44 mL). ? Discuss your alcohol use with your health care provider. Alcohol can affect any antidepressant medicines you are taking.  Discuss any drug use with your health care provider. General instructions  Take over-the-counter and prescription medicines only as told by your health care provider.  Eat a healthy diet and get plenty of sleep.  Consider joining a support group. Your health care provider may be able to recommend one.  Keep all follow-up visits as told by your health care provider. This is important.   Where to find more information  National Alliance on Mental Illness: www.nami.org  [place] National Institute of Mental Health: www.nimh.nih.gov Contact a health care provider if:  Your symptoms get worse.  You develop new symptoms. Get help right away if:  You  self-harm.  You have serious thoughts about hurting yourself or others.  You hallucinate. If you ever feel like you may hurt yourself or others, or have thoughts about taking your own life, get help right away. Go to your nearest emergency department or:  Call your local emergency services (911 in the [place]  Call a suicide crisis helpline, such as the National Suicide Prevention Lifeline at 1-800-273-8255. This is open 24 hours a day in the [place]  Text the Crisis Text Line at 741741 (in the [place] Summary  Major depressive disorder (MDD) is a mental health condition. MDD causes symptoms of sadness, hopelessness, and loss of interest in things. These symptoms last most of the day, almost every day, for 2 weeks.  The symptoms of MDD can interfere with relationships and with everyday activities.  Treatments and support are available for people who develop MDD. You may need more than one type of treatment.  Get help right away if you have serious thoughts about hurting yourself or others. This information is not intended to replace advice given to you by your health care provider. Make sure you discuss any questions you have with your health care provider. Document Revised: 11/18/2019 Document Reviewed: 11/18/2019 Elsevier Patient Education  2021 [employer]  

## 2021-05-06 NOTE — Progress Notes (Signed)
Subjective:   By signing my name below, I, [name], attest that this documentation has been prepared under the direction and in the presence of Dr. [name], DO. 05/06/2021      Patient ID: Martha Hampton, female    DOB: 05/22/1966, 55 y.o.   MRN: [id number]  Chief Complaint  Patient presents with  . Depression    Paxil refill request     HPI Patient is in today for a office visit. She complains her depression symptoms have been worsening. She is requesting a refill on 40 mg Paxil to manage her symptoms. She also mentions her blood pressure has been elevated recently. She has leg swelling. She has fluttering in her heart during the morning. She has a family history of A-fib. She denies having any fever, ear pain, congestion, sinus pain, sore throat, eye pain, chest pain, cough, SOB, wheezing, n/v/d, constipation, blood in stool, dysuria, frequency, hematuria, dizziness, or headaches at this time. She is leaving tomorrow for a vacation to [place]. She is interested in making an appointment for a colonoscopy.   Past Medical History:  Diagnosis Date  . Anemia    iron deficiency  . Anxiety   . Genital herpes   . GERD (gastroesophageal reflux disease)   . Hyperlipidemia     Past Surgical History:  Procedure Laterality Date  . [place]   left  . CHOLECYSTECTOMY  11-2005  . COLONOSCOPY  01/06/2013   Procedure: COLONOSCOPY;  Surgeon: Beryle Beams, MD;  Location: [facility];  Service: Gastroenterology;  Laterality: N/A;  . 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 Hypertension Mother   . Cancer Mother        non-dodgkins mantel cell  . Hypertension Father   . Rectal cancer Maternal Grandmother   . Breast cancer Maternal Aunt     Social History   Socioeconomic History  . Marital status: Married    Spouse name: Not on file  . Number of children: Not on file  . Years of education: Not on file  . Highest  education level: Not on file  Occupational History  . Not on file  Tobacco Use  . Smoking status: Never Smoker  . Smokeless tobacco: Never Used  Substance and Sexual Activity  . Alcohol use: Yes    Comment: daily  . Drug use: No  . Sexual activity: Not on file  Other Topics Concern  . Not on file  Social History Narrative  . Not on file   Social Determinants of Health   Financial Resource Strain: Not on file  Food Insecurity: Not on file  Transportation Needs: Not on file  Physical Activity: Not on file  Stress: Not on file  Social Connections: Not on file  Intimate Partner Violence: Not on file    Outpatient Medications Prior to Visit  Medication Sig Dispense Refill  . B Complex Vitamins (B COMPLEX 1 PO) Take by mouth daily.    . cetirizine (ZYRTEC) 10 MG tablet Take 10 mg by mouth daily.    Marland Kitchen levonorgestrel (MIRENA) 20 MCG/DAY IUD 1 each by Intrauterine route once.    Marland Kitchen PARoxetine (PAXIL) 40 MG tablet Take 1 tablet (40 mg total) by mouth every morning. 14 tablet 0   Facility-Administered Medications Prior to Visit  Medication Dose Route Frequency Provider Last Rate Last Admin  . cyanocobalamin ((VITAMIN B-12)) injection 1,000 mcg  1,000 mcg Intramuscular Once  [name] R, DO        Allergies  Allergen Reactions  . Anesthetics, Amide     Pt has malignant hyperthermia   . Penicillins     REACTION: RASH    Review of Systems  Constitutional: Negative for fever.  HENT: Negative for congestion, ear pain, sinus pain and sore throat.   Eyes: Negative for pain.  Respiratory: Negative for cough, shortness of breath and wheezing.   Cardiovascular: Positive for palpitations (During the morning) and leg swelling (Bilateral). Negative for chest pain.  Gastrointestinal: Negative for blood in stool, constipation, diarrhea, nausea and vomiting.  Genitourinary: Negative for dysuria, frequency and hematuria.  Neurological: Negative for dizziness and headaches.        Objective:    Physical Exam Constitutional:      Appearance: Normal appearance.  HENT:     Head: Normocephalic and atraumatic.     Right Ear: External ear normal.     Left Ear: External ear normal.  Eyes:     Extraocular Movements: Extraocular movements intact.     Pupils: Pupils are equal, round, and reactive to light.  Cardiovascular:     Rate and Rhythm: Normal rate and regular rhythm.     Pulses: Normal pulses.     Heart sounds: Normal heart sounds. No murmur heard. No friction rub. No gallop.   Pulmonary:     Effort: Pulmonary effort is normal. No respiratory distress.     Breath sounds: Normal breath sounds. No stridor. No wheezing, rhonchi or rales.  Skin:    General: Skin is warm and dry.  Neurological:     Mental Status: She is alert and oriented to person, place, and time.  Psychiatric:        Behavior: Behavior normal.     BP (!) 160/61 (BP Location: Right Arm, Cuff Size: Large)   Pulse 86   Temp 98.8 F (37.1 C) (Oral)   Resp 12   Ht 5\' 3"  (1.6 m)   Wt 221 lb 3.2 oz (100.3 kg)   SpO2 98%   BMI 39.18 kg/m  Wt Readings from Last 3 Encounters:  05/06/21 221 lb 3.2 oz (100.3 kg)  11/25/20 233 lb (105.7 kg)  06/28/20 240 lb (108.9 kg)    Diabetic Foot Exam - Simple   No data filed    Lab Results  Component Value Date   WBC 7.6 11/25/2020   HGB 12.1 11/25/2020   HCT 38.0 11/25/2020   PLT 309 11/25/2020   GLUCOSE 133 (H) 11/25/2020   CHOL 174 04/15/2009   TRIG 56.0 04/15/2009   HDL 47.20 04/15/2009   LDLCALC 116 (H) 04/15/2009   ALT 47 (A) 03/22/2018   AST 74 (A) 03/22/2018   NA 139 11/25/2020   K 3.6 11/25/2020   CL 102 11/25/2020   CREATININE 0.75 11/25/2020   BUN 10 11/25/2020   CO2 26 11/25/2020   TSH 1.297 06/28/2020   INR 1.0 04/01/2015   HGBA1C 5.8 11/18/2015    Lab Results  Component Value Date   TSH 1.297 06/28/2020   Lab Results  Component Value Date   WBC 7.6 11/25/2020   HGB 12.1 11/25/2020   HCT 38.0 11/25/2020    MCV 86.6 11/25/2020   PLT 309 11/25/2020   Lab Results  Component Value Date   NA 139 11/25/2020   K 3.6 11/25/2020   CO2 26 11/25/2020   GLUCOSE 133 (H) 11/25/2020   BUN 10 11/25/2020   CREATININE 0.75 11/25/2020  BILITOT 0.4 08/09/2017   ALKPHOS 134 (A) 03/22/2018   AST 74 (A) 03/22/2018   ALT 47 (A) 03/22/2018   PROT 7.3 08/09/2017   ALBUMIN 4.0 08/09/2017   CALCIUM 9.2 11/25/2020   ANIONGAP 11 11/25/2020   GFR 74.98 08/09/2017   Lab Results  Component Value Date   CHOL 174 04/15/2009   Lab Results  Component Value Date   HDL 47.20 04/15/2009   Lab Results  Component Value Date   LDLCALC 116 (H) 04/15/2009   Lab Results  Component Value Date   TRIG 56.0 04/15/2009   Lab Results  Component Value Date   CHOLHDL 4 04/15/2009   Lab Results  Component Value Date   HGBA1C 5.8 11/18/2015       Assessment & Plan:   Problem List Items Addressed This Visit      Unprioritized   Depression with anxiety    Refill paxil        Relevant Medications   PARoxetine (PAXIL) 40 MG tablet   Hyperlipidemia    Encouraged heart healthy diet, increase exercise, avoid trans fats, consider a krill oil cap daily      Relevant Medications   hydrochlorothiazide (HYDRODIURIL) 25 MG tablet   Palpitations    Check labs Event monitor per er-- need cards referral         Other Visit Diagnoses    Primary hypertension    -  Primary   Relevant Medications   hydrochlorothiazide (HYDRODIURIL) 25 MG tablet   Other Relevant Orders   Lipid panel   TSH   CBC with Differential/Platelet   Comprehensive metabolic panel   Severe episode of recurrent major depressive disorder, without psychotic features (HCC)       Relevant Medications   PARoxetine (PAXIL) 40 MG tablet   Palpitation       Relevant Orders   ECHOCARDIOGRAM COMPLETE   Ambulatory referral to Cardiology   Lipid panel   TSH   CBC with Differential/Platelet   Comprehensive metabolic panel       Meds ordered  this encounter  Medications  . PARoxetine (PAXIL) 40 MG tablet    Sig: Take 1 tablet (40 mg total) by mouth every morning.    Dispense:  90 tablet    Refill:  3  . hydrochlorothiazide (HYDRODIURIL) 25 MG tablet    Sig: Take 1 tablet (25 mg total) by mouth daily.    Dispense:  90 tablet    Refill:  3    I, Dr. [name], DO., personally preformed the services described in this documentation.  All medical record entries made by the scribe were at my direction and in my presence.  I have reviewed the chart and discharge instructions (if applicable) and agree that the record reflects my personal performance and is accurate and complete. 05/06/2021   I,[name],acting as a scribe for Ann Held, DO.,have documented all relevant documentation on the behalf of Ann Held, DO,as directed by  Ann Held, DO while in the presence of Ann Held, DO.   Ann Held, DO

## 2021-05-06 NOTE — Assessment & Plan Note (Signed)
Check labs Event monitor per er-- need cards referral

## 2021-05-06 NOTE — Assessment & Plan Note (Signed)
Encouraged heart healthy diet, increase exercise, avoid trans fats, consider a krill oil cap daily 

## 2021-05-06 NOTE — Assessment & Plan Note (Signed)
hctz daily F/u 2-3 weeks  Check labs  Eat green leafy veg daily to preven dec K

## 2021-05-06 NOTE — Assessment & Plan Note (Signed)
Refill paxil

## 2021-05-07 LAB — COMPREHENSIVE METABOLIC PANEL
ALT: 80 U/L — ABNORMAL HIGH (ref 0–35)
AST: 64 U/L — ABNORMAL HIGH (ref 0–37)
Albumin: 4.6 g/dL (ref 3.5–5.2)
Alkaline Phosphatase: 88 U/L (ref 39–117)
BUN: 17 mg/dL (ref 6–23)
CO2: 25 mEq/L (ref 19–32)
Calcium: 9.7 mg/dL (ref 8.4–10.5)
Chloride: 98 mEq/L (ref 96–112)
Creatinine, Ser: 0.62 mg/dL (ref 0.40–1.20)
GFR: 100.81 mL/min (ref >60.00)
Glucose, Bld: 105 mg/dL — ABNORMAL HIGH (ref 70–99)
Potassium: 3.9 mEq/L (ref 3.5–5.1)
Sodium: 137 mEq/L (ref 135–145)
Total Bilirubin: 0.4 mg/dL (ref 0.2–1.2)
Total Protein: 7.1 g/dL (ref 6.0–8.3)

## 2021-05-07 LAB — LIPID PANEL
Cholesterol: 180 mg/dL (ref 0–200)
HDL: 46.1 mg/dL (ref >39.00)
LDL Cholesterol: 113 mg/dL — ABNORMAL HIGH (ref 0–99)
NonHDL: 134.31
Total CHOL/HDL Ratio: 4
Triglycerides: 106 mg/dL (ref 0.0–149.0)
VLDL: 21.2 mg/dL (ref 0.0–40.0)

## 2021-05-07 LAB — CBC WITH DIFFERENTIAL/PLATELET
Basophils Absolute: 0 10*3/uL (ref 0.0–0.1)
Basophils Relative: 0.5 % (ref 0.0–3.0)
Eosinophils Absolute: 0.1 10*3/uL (ref 0.0–0.7)
Eosinophils Relative: 1.1 % (ref 0.0–5.0)
HCT: 37.4 % (ref 36.0–46.0)
Hemoglobin: 11.9 g/dL — ABNORMAL LOW (ref 12.0–15.0)
Lymphocytes Relative: 28.9 % (ref 12.0–46.0)
Lymphs Abs: 1.8 10*3/uL (ref 0.7–4.0)
MCHC: 31.7 g/dL (ref 30.0–36.0)
MCV: 80.7 fl (ref 78.0–100.0)
Monocytes Absolute: 0.5 10*3/uL (ref 0.1–1.0)
Monocytes Relative: 8.6 % (ref 3.0–12.0)
Neutro Abs: 3.7 10*3/uL (ref 1.4–7.7)
Neutrophils Relative %: 60.9 % (ref 43.0–77.0)
Platelets: 252 10*3/uL (ref 150.0–400.0)
RBC: 4.64 Mil/uL (ref 3.87–5.11)
RDW: 17.7 % — ABNORMAL HIGH (ref 11.5–15.5)
WBC: 6.1 10*3/uL (ref 4.0–10.5)

## 2021-05-07 LAB — TSH: TSH: 1.04 u[IU]/mL (ref 0.35–4.50)

## 2021-05-27 ENCOUNTER — Ambulatory Visit: Payer: 59 | Admitting: Family Medicine

## 2021-05-27 NOTE — Progress Notes (Incomplete)
Subjective:   By signing my name below, I, [name], attest that this documentation has been prepared under the direction and in the presence of Dr. [name], DO. 05/27/2021     Patient ID: Martha Hampton, female    DOB: November 22, 1966, 55 y.o.   MRN: [id number]  No chief complaint on file.   HPI Patient is in today for a office visit.    Past Medical History:  Diagnosis Date  . Anemia    iron deficiency  . Anxiety   . Genital herpes   . GERD (gastroesophageal reflux disease)   . Hyperlipidemia     Past Surgical History:  Procedure Laterality Date  . [place]   left  . CHOLECYSTECTOMY  11-2005  . COLONOSCOPY  01/06/2013   Procedure: COLONOSCOPY;  Surgeon: Beryle Beams, MD;  Location: [facility];  Service: Gastroenterology;  Laterality: N/A;  . 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 Hypertension Mother   . Cancer Mother        non-dodgkins mantel cell  . Hypertension Father   . Rectal cancer Maternal Grandmother   . Breast cancer Maternal Aunt     Social History   Socioeconomic History  . Marital status: Married    Spouse name: Not on file  . Number of children: Not on file  . Years of education: Not on file  . Highest education level: Not on file  Occupational History  . Not on file  Tobacco Use  . Smoking status: Never Smoker  . Smokeless tobacco: Never Used  Substance and Sexual Activity  . Alcohol use: Yes    Comment: daily  . Drug use: No  . Sexual activity: Not on file  Other Topics Concern  . Not on file  Social History Narrative  . Not on file   Social Determinants of Health   Financial Resource Strain: Not on file  Food Insecurity: Not on file  Transportation Needs: Not on file  Physical Activity: Not on file  Stress: Not on file  Social Connections: Not on file  Intimate Partner Violence: Not on file    Outpatient Medications Prior to Visit  Medication Sig Dispense  Refill  . B Complex Vitamins (B COMPLEX 1 PO) Take by mouth daily.    . cetirizine (ZYRTEC) 10 MG tablet Take 10 mg by mouth daily.    . hydrochlorothiazide (HYDRODIURIL) 25 MG tablet Take 1 tablet (25 mg total) by mouth daily. 90 tablet 3  . levonorgestrel (MIRENA) 20 MCG/DAY IUD 1 each by Intrauterine route once.    Marland Kitchen PARoxetine (PAXIL) 40 MG tablet Take 1 tablet (40 mg total) by mouth every morning. 90 tablet 3   Facility-Administered Medications Prior to Visit  Medication Dose Route Frequency Provider Last Rate Last Admin  . cyanocobalamin ((VITAMIN B-12)) injection 1,000 mcg  1,000 mcg Intramuscular Once Carollee Herter, Kendrick Fries R, DO        Allergies  Allergen Reactions  . Anesthetics, Amide     Pt has malignant hyperthermia   . Penicillins     REACTION: RASH    ROS     Objective:    Physical Exam Constitutional:      General: She is not in acute distress.    Appearance: Normal appearance. She is not ill-appearing.  HENT:     Head: Normocephalic and atraumatic.     Right Ear: External ear normal.  Left Ear: External ear normal.  Eyes:     Extraocular Movements: Extraocular movements intact.     Pupils: Pupils are equal, round, and reactive to light.  Cardiovascular:     Rate and Rhythm: Normal rate and regular rhythm.     Pulses: Normal pulses.     Heart sounds: Normal heart sounds. No murmur heard. No gallop.   Pulmonary:     Effort: Pulmonary effort is normal. No respiratory distress.     Breath sounds: Normal breath sounds. No wheezing, rhonchi or rales.  Skin:    General: Skin is warm and dry.  Neurological:     Mental Status: She is alert and oriented to person, place, and time.  Psychiatric:        Behavior: Behavior normal.     There were no vitals taken for this visit. Wt Readings from Last 3 Encounters:  05/06/21 221 lb 3.2 oz (100.3 kg)  11/25/20 233 lb (105.7 kg)  06/28/20 240 lb (108.9 kg)    Diabetic Foot Exam - Simple   No data filed     Lab Results  Component Value Date   WBC 6.1 05/06/2021   HGB 11.9 (L) 05/06/2021   HCT 37.4 05/06/2021   PLT 252.0 05/06/2021   GLUCOSE 105 (H) 05/06/2021   CHOL 180 05/06/2021   TRIG 106.0 05/06/2021   HDL 46.10 05/06/2021   LDLCALC 113 (H) 05/06/2021   ALT 80 (H) 05/06/2021   AST 64 (H) 05/06/2021   NA 137 05/06/2021   K 3.9 05/06/2021   CL 98 05/06/2021   CREATININE 0.62 05/06/2021   BUN 17 05/06/2021   CO2 25 05/06/2021   TSH 1.04 05/06/2021   INR 1.0 04/01/2015   HGBA1C 5.8 11/18/2015    Lab Results  Component Value Date   TSH 1.04 05/06/2021   Lab Results  Component Value Date   WBC 6.1 05/06/2021   HGB 11.9 (L) 05/06/2021   HCT 37.4 05/06/2021   MCV 80.7 05/06/2021   PLT 252.0 05/06/2021   Lab Results  Component Value Date   NA 137 05/06/2021   K 3.9 05/06/2021   CO2 25 05/06/2021   GLUCOSE 105 (H) 05/06/2021   BUN 17 05/06/2021   CREATININE 0.62 05/06/2021   BILITOT 0.4 05/06/2021   ALKPHOS 88 05/06/2021   AST 64 (H) 05/06/2021   ALT 80 (H) 05/06/2021   PROT 7.1 05/06/2021   ALBUMIN 4.6 05/06/2021   CALCIUM 9.7 05/06/2021   ANIONGAP 11 11/25/2020   GFR 100.81 05/06/2021   Lab Results  Component Value Date   CHOL 180 05/06/2021   Lab Results  Component Value Date   HDL 46.10 05/06/2021   Lab Results  Component Value Date   LDLCALC 113 (H) 05/06/2021   Lab Results  Component Value Date   TRIG 106.0 05/06/2021   Lab Results  Component Value Date   CHOLHDL 4 05/06/2021   Lab Results  Component Value Date   HGBA1C 5.8 11/18/2015       Assessment & Plan:   Problem List Items Addressed This Visit   None      No orders of the defined types were placed in this encounter.   I, Dr. [name], DO, personally preformed the services described in this documentation.  All medical record entries made by the scribe were at my direction and in my presence.  I have reviewed the chart and discharge instructions (if  applicable) and agree that the record reflects my  personal performance and is accurate and complete. 05/27/2021   I,[name],acting as a scribe for Ann Held, DO.,have documented all relevant documentation on the behalf of Ann Held, DO,as directed by  Ann Held, DO while in the presence of Ann Held, DO.   Shehryar [employer]

## 2021-06-05 ENCOUNTER — Ambulatory Visit: Payer: 59 | Admitting: Family Medicine

## 2021-06-05 NOTE — Progress Notes (Incomplete)
Subjective:   By signing my name below, I, Martha Hampton, attest that this documentation has been prepared under the direction and in the presence of Dr. [name], DO. 06/05/2021      Patient ID: Martha Hampton, female    DOB: Oct 05, 1966, 55 y.o.   MRN: [id number]  No chief complaint on file.   HPI Patient is in today for a office visit.  Past Medical History:  Diagnosis Date   Anemia    iron deficiency   Anxiety    Genital herpes    GERD (gastroesophageal reflux disease)    Hyperlipidemia     Past Surgical History:  Procedure Laterality Date   ANKLE SURGERY  1986   left   CHOLECYSTECTOMY  11-2005   COLONOSCOPY  01/06/2013   Procedure: COLONOSCOPY;  Surgeon: Beryle Beams, MD;  Location: [facility];  Service: Gastroenterology;  Laterality: N/A;   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    Family History  Problem Relation Age of Onset   Hypertension Mother    Cancer Mother        non-dodgkins mantel cell   Hypertension Father    Rectal cancer Maternal Grandmother    Breast cancer Maternal Aunt     Social History   Socioeconomic History   Marital status: Married    Spouse name: Not on file   Number of children: Not on file   Years of education: Not on file   Highest education level: Not on file  Occupational History   Not on file  Tobacco Use   Smoking status: Never   Smokeless tobacco: Never  Substance and Sexual Activity   Alcohol use: Yes    Comment: daily   Drug use: No   Sexual activity: Not on file  Other Topics Concern   Not on file  Social History Narrative   Not on file   Social Determinants of Health   Financial Resource Strain: Not on file  Food Insecurity: Not on file  Transportation Needs: Not on file  Physical Activity: Not on file  Stress: Not on file  Social Connections: Not on file  Intimate Partner Violence: Not on file    Outpatient Medications Prior to Visit  Medication Sig Dispense Refill   B Complex Vitamins (B COMPLEX 1  PO) Take by mouth daily.     cetirizine (ZYRTEC) 10 MG tablet Take 10 mg by mouth daily.     hydrochlorothiazide (HYDRODIURIL) 25 MG tablet Take 1 tablet (25 mg total) by mouth daily. 90 tablet 3   levonorgestrel (MIRENA) 20 MCG/DAY IUD 1 each by Intrauterine route once.     PARoxetine (PAXIL) 40 MG tablet Take 1 tablet (40 mg total) by mouth every morning. 90 tablet 3   Facility-Administered Medications Prior to Visit  Medication Dose Route Frequency Provider Last Rate Last Admin   cyanocobalamin ((VITAMIN B-12)) injection 1,000 mcg  1,000 mcg Intramuscular Once Carollee Herter, Yvonne R, DO        Allergies  Allergen Reactions   Anesthetics, Amide     Pt has malignant hyperthermia    Penicillins     REACTION: RASH    ROS     Objective:    Physical Exam Constitutional:      General: She is not in acute distress.    Appearance: Normal appearance. She is not ill-appearing.  HENT:     Head: Normocephalic and atraumatic.     Right Ear: External ear normal.     Left Ear:  External ear normal.  Eyes:     Extraocular Movements: Extraocular movements intact.     Pupils: Pupils are equal, round, and reactive to light.  Cardiovascular:     Rate and Rhythm: Normal rate and regular rhythm.     Pulses: Normal pulses.     Heart sounds: Normal heart sounds. No murmur heard.   No gallop.  Pulmonary:     Effort: Pulmonary effort is normal. No respiratory distress.     Breath sounds: Normal breath sounds. No wheezing, rhonchi or rales.  Skin:    General: Skin is warm and dry.  Neurological:     Mental Status: She is alert and oriented to person, place, and time.  Psychiatric:        Behavior: Behavior normal.    There were no vitals taken for this visit. Wt Readings from Last 3 Encounters:  05/06/21 221 lb 3.2 oz (100.3 kg)  11/25/20 233 lb (105.7 kg)  06/28/20 240 lb (108.9 kg)    Diabetic Foot Exam - Simple   No data filed    Lab Results  Component Value Date   WBC 6.1  05/06/2021   HGB 11.9 (L) 05/06/2021   HCT 37.4 05/06/2021   PLT 252.0 05/06/2021   GLUCOSE 105 (H) 05/06/2021   CHOL 180 05/06/2021   TRIG 106.0 05/06/2021   HDL 46.10 05/06/2021   LDLCALC 113 (H) 05/06/2021   ALT 80 (H) 05/06/2021   AST 64 (H) 05/06/2021   NA 137 05/06/2021   K 3.9 05/06/2021   CL 98 05/06/2021   CREATININE 0.62 05/06/2021   BUN 17 05/06/2021   CO2 25 05/06/2021   TSH 1.04 05/06/2021   INR 1.0 04/01/2015   HGBA1C 5.8 11/18/2015    Lab Results  Component Value Date   TSH 1.04 05/06/2021   Lab Results  Component Value Date   WBC 6.1 05/06/2021   HGB 11.9 (L) 05/06/2021   HCT 37.4 05/06/2021   MCV 80.7 05/06/2021   PLT 252.0 05/06/2021   Lab Results  Component Value Date   NA 137 05/06/2021   K 3.9 05/06/2021   CO2 25 05/06/2021   GLUCOSE 105 (H) 05/06/2021   BUN 17 05/06/2021   CREATININE 0.62 05/06/2021   BILITOT 0.4 05/06/2021   ALKPHOS 88 05/06/2021   AST 64 (H) 05/06/2021   ALT 80 (H) 05/06/2021   PROT 7.1 05/06/2021   ALBUMIN 4.6 05/06/2021   CALCIUM 9.7 05/06/2021   ANIONGAP 11 11/25/2020   GFR 100.81 05/06/2021   Lab Results  Component Value Date   CHOL 180 05/06/2021   Lab Results  Component Value Date   HDL 46.10 05/06/2021   Lab Results  Component Value Date   LDLCALC 113 (H) 05/06/2021   Lab Results  Component Value Date   TRIG 106.0 05/06/2021   Lab Results  Component Value Date   CHOLHDL 4 05/06/2021   Lab Results  Component Value Date   HGBA1C 5.8 11/18/2015       Assessment & Plan:   Problem List Items Addressed This Visit   None    No orders of the defined types were placed in this encounter.   I, Dr. [name], DO, personally preformed the services described in this documentation.  All medical record entries made by the scribe were at my direction and in my presence.  I have reviewed the chart and discharge instructions (if applicable) and agree that the record reflects my personal  performance and is  accurate and complete. 06/05/2021   I,Martha Hampton,acting as a scribe for Ann Held, DO.,have documented all relevant documentation on the behalf of Ann Held, DO,as directed by  Ann Held, DO while in the presence of Ann Held, DO.   Martha Hampton

## 2021-06-10 ENCOUNTER — Ambulatory Visit: Payer: 59 | Admitting: Family Medicine

## 2021-06-12 ENCOUNTER — Other Ambulatory Visit (HOSPITAL_BASED_OUTPATIENT_CLINIC_OR_DEPARTMENT_OTHER): Payer: 59

## 2021-06-17 ENCOUNTER — Ambulatory Visit (HOSPITAL_BASED_OUTPATIENT_CLINIC_OR_DEPARTMENT_OTHER)
Admission: RE | Admit: 2021-06-17 | Discharge: 2021-06-17 | Disposition: A | Discharge status: Home / Self Care | Payer: 59 | Source: Ambulatory Visit | Attending: Family Medicine | Admitting: Family Medicine

## 2021-06-17 ENCOUNTER — Other Ambulatory Visit: Payer: Self-pay

## 2021-06-17 DIAGNOSIS — I361 Nonrheumatic tricuspid (valve) insufficiency: Secondary | ICD-10-CM | POA: Diagnosis not present

## 2021-06-17 DIAGNOSIS — R002 Palpitations: Secondary | ICD-10-CM | POA: Diagnosis present

## 2021-06-17 LAB — ECHOCARDIOGRAM COMPLETE
AR max vel: 2.23 cm2
AV Area VTI: 2.24 cm2
AV Area mean vel: 2.35 cm2
AV Mean grad: 5 mmHg
AV Peak grad: 9.1 mmHg
Ao pk vel: 1.51 m/s
Area-P 1/2: 3.93 cm2
Calc EF: 49.1 %
S' Lateral: 2.74 cm
Single Plane A2C EF: 49.9 %
Single Plane A4C EF: 49.2 %

## 2021-06-17 NOTE — Progress Notes (Signed)
*  PRELIMINARY RESULTS* Echocardiogram 2D Echocardiogram has been performed.  Luisa Hart RDCS 06/17/2021, 8:13 AM

## 2021-06-18 ENCOUNTER — Ambulatory Visit (INDEPENDENT_AMBULATORY_CARE_PROVIDER_SITE_OTHER): Payer: 59

## 2021-06-18 ENCOUNTER — Encounter: Payer: Self-pay | Admitting: Cardiology

## 2021-06-18 ENCOUNTER — Ambulatory Visit: Payer: 59 | Admitting: Cardiology

## 2021-06-18 VITALS — BP 142/82 | HR 90 | Ht 63.0 in | Wt 219.0 lb

## 2021-06-18 DIAGNOSIS — R002 Palpitations: Secondary | ICD-10-CM

## 2021-06-18 DIAGNOSIS — I10 Essential (primary) hypertension: Secondary | ICD-10-CM

## 2021-06-18 DIAGNOSIS — R0602 Shortness of breath: Secondary | ICD-10-CM

## 2021-06-18 DIAGNOSIS — R600 Localized edema: Secondary | ICD-10-CM

## 2021-06-18 DIAGNOSIS — E669 Obesity, unspecified: Secondary | ICD-10-CM

## 2021-06-18 HISTORY — DX: Localized edema: R60.0

## 2021-06-18 HISTORY — DX: Shortness of breath: R06.02

## 2021-06-18 HISTORY — DX: Essential (primary) hypertension: I10

## 2021-06-18 MED ORDER — AMLODIPINE BESYLATE 2.5 MG PO TABS: 2.5000 mg | ORAL_TABLET | Freq: Every day | ORAL | 3 refills | Status: DC

## 2021-06-18 NOTE — Progress Notes (Signed)
Cardiology Office Note:    Date:  06/18/2021   ID:  Martha Hampton, DOB 08-04-1966, MRN [id number]  PCP:  Martha Hampton, Martha Apa, DO  Cardiologist:  None  Electrophysiologist:  None   Referring MD: Martha Hampton, Martha Hampton, *   No chief complaint on file. I have had some palpitations  History of Present Illness:    Martha Hampton is a 55 y.o. female with a hx of hypertension, hyperlipidemia, history of COVID-19 infection, history of asthma obesity here to be evaluated for palpitations.  The patient tells me that she started experiencing symptoms of palpitations since for a while now.  She described as abrupt onset of fast heartbeat which last for few minutes and then resolved.  She notes that it comes on at all times.  Nothing makes it better or worse.  She does have some associated shortness of breath but does admit that she she is also deconditioned and has not been exercising.  She denies any chest pain.  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   B Complex Vitamins (B COMPLEX 1 PO) Take by mouth daily.   cetirizine (ZYRTEC) 10 MG tablet Take 10 mg by mouth daily.   hydrochlorothiazide (HYDRODIURIL) 25 MG tablet Take 1 tablet (25 mg total) by mouth daily.   levonorgestrel (MIRENA) 20 MCG/DAY IUD 1 each by Intrauterine route once.   PARoxetine (PAXIL) 40 MG tablet Take 1 tablet (40 mg total) by mouth every morning.   Current  Facility-Administered Medications for the 06/18/21 encounter (Office Visit) with Berniece Salines, DO  Medication   cyanocobalamin ((VITAMIN B-12)) injection 1,000 mcg     Allergies:   Latex; Anesthetics, amide; and Penicillins   Social History   Socioeconomic History   Marital status: Married    Spouse name: Not on file   Number of children: Not on file   Years of education: Not on file   Highest education level: Not on file  Occupational History   Not on file  Tobacco Use   Smoking status: Never   Smokeless tobacco: Never  Substance and Sexual Activity   Alcohol use: Yes    Comment: daily   Drug use: No   Sexual activity: Not on file  Other Topics Concern   Not on file  Social History Narrative   Not on file   Social Determinants of Health   Financial Resource Strain: Not on file  Food Insecurity: Not on file  Transportation Needs: Not on file  Physical Activity: Not on file  Stress: Not on file  Social Connections: Not on file     Family History: The patient's family history includes Breast cancer in her maternal aunt; Cancer in her mother; Hypertension in her father and mother; Rectal cancer in her maternal grandmother.  ROS:   Review of Systems  Constitution: Negative for decreased appetite, fever  and weight gain.  HENT: Negative for congestion, ear discharge, hoarse voice and sore throat.   Eyes: Negative for discharge, redness, vision loss in right eye and visual halos.  Cardiovascular: Negative for chest pain, dyspnea on exertion, leg swelling, orthopnea and palpitations.  Respiratory: Negative for cough, hemoptysis, shortness of breath and snoring.   Endocrine: Negative for heat intolerance and polyphagia.  Hematologic/Lymphatic: Negative for bleeding problem. Does not bruise/bleed easily.  Skin: Negative for flushing, nail changes, rash and suspicious lesions.  Musculoskeletal: Negative for arthritis, joint pain, muscle cramps, myalgias, neck pain and  stiffness.  Gastrointestinal: Negative for abdominal pain, bowel incontinence, diarrhea and excessive appetite.  Genitourinary: Negative for decreased libido, genital sores and incomplete emptying.  Neurological: Negative for brief paralysis, focal weakness, headaches and loss of balance.  Psychiatric/Behavioral: Negative for altered mental status, depression and suicidal ideas.  Allergic/Immunologic: Negative for HIV exposure and persistent infections.    EKGs/Labs/Other Studies Reviewed:    The following studies were reviewed today:   EKG:  The ekg ordered today demonstrates sinus rhythm, heart rate 90 bpm  Recent Labs: 11/25/2020: Magnesium 1.5 05/06/2021: ALT 80; BUN 17; Creatinine, Ser 0.62; Hemoglobin 11.9; Platelets 252.0; Potassium 3.9; Sodium 137; TSH 1.04  Recent Lipid Panel    Component Value Date/Time   CHOL 180 05/06/2021 1810   TRIG 106.0 05/06/2021 1810   HDL 46.10 05/06/2021 1810   CHOLHDL 4 05/06/2021 1810   VLDL 21.2 05/06/2021 1810   LDLCALC 113 (H) 05/06/2021 1810    Physical Exam:    VS:  BP (!) 142/82 (BP Location: Right Arm)   Pulse 90   Ht 5\' 3"  (1.6 m)   Wt 219 lb (99.3 kg)   SpO2 98%   BMI 38.79 kg/m     Wt Readings from Last 3 Encounters:  06/18/21 219 lb (99.3 kg)  05/06/21 221 lb 3.2 oz (100.3 kg)  11/25/20 233 lb (105.7 kg)     GEN: Well nourished, well developed in no acute distress HEENT: Normal NECK: No JVD; No carotid bruits LYMPHATICS: No lymphadenopathy CARDIAC: S1S2 noted,RRR, no murmurs, rubs, gallops RESPIRATORY:  Clear to auscultation without rales, wheezing or rhonchi  ABDOMEN: Soft, non-tender, non-distended, +bowel sounds, no guarding. EXTREMITIES: No edema, No cyanosis, no clubbing MUSCULOSKELETAL:  No deformity  SKIN: Warm and dry NEUROLOGIC:  Alert and oriented x 3, non-focal PSYCHIATRIC:  Normal affect, good insight  ASSESSMENT:    1. Hypertension, unspecified type   2. Bilateral leg edema   3. Palpitations    4. SOB (shortness of breath)   5. Obesity (BMI 30-39.9)    PLAN:     I would like to rule out a cardiovascular etiology of this palpitation, therefore at this time I would like to placed a zio patch for   7 days.  We reviewed her echocardiogram which showed evidence of grade 1 diastolic dysfunction.  For now she is on diuretics and continue to monitor her.  But she has a history of asthma so it would be appropriate to go ahead and have the patient undergo pulmonary function test to to get some understanding of her shortness of breath.  I suspect that likely deconditioning may also be playing a role here with this patient. She is hypertensive in the office she has not taking her medications.  She says she missed her hydrochlorothiazide most days because she works and does not want to have to go to the bathroom all the time.  I Minna start her on  low-dose amlodipine hopefully this will help to bring her to a target of less than 130/80 mmHg.  She will still remain on the hydrochlorothiazide.  Discussed with the patient about the importance of full compliance with her blood pressure medication in the extremity effects for high blood pressure. The patient understands the need to lose weight with diet and exercise. We have discussed specific strategies for this. The patient is in agreement with the above plan. The patient left the office in stable condition.  The patient will follow up in   Medication Adjustments/Labs and Tests Ordered: Current medicines are reviewed at length with the patient today.  Concerns regarding medicines are outlined above.  Orders Placed This Encounter  Procedures   LONG TERM MONITOR (3-14 DAYS)   EKG 12-Lead   Pulmonary function test   Meds ordered this encounter  Medications   amLODipine (NORVASC) 2.5 MG tablet    Sig: Take 1 tablet (2.5 mg total) by mouth daily.    Dispense:  180 tablet    Refill:  3    Patient Instructions  Medication Instructions:   Your  physician has recommended you make the following change in your medication:  START: Amlodipine 2.5 mg once daily   *If you need a refill on your cardiac medications before your next appointment, please call your pharmacy*   Lab Work: None If you have labs (blood work) drawn today and your tests are completely normal, you will receive your results only by: [place] (if you have MyChart) OR A paper copy in the mail If you have any lab test that is abnormal or we need to change your treatment, we will call you to review the results.   Testing/Procedures: A zio monitor was ordered today. It will remain on for 14 days. You will then return monitor and event diary in provided box. It takes 1-2 weeks for report to be downloaded and returned to [place]. We will call you with the results. If monitor falls off or has orange flashing light, please call Zio for further instructions.     Follow-Up: At [facility], you and your health needs are our priority.  As part of our continuing mission to provide you with exceptional heart care, we have created designated Provider Care Teams.  These Care Teams include your primary Cardiologist (physician) and Advanced Practice Providers (APPs -  Physician Assistants and Nurse Practitioners) who all work together to provide you with the care you need, when you need it.  We recommend signing up for the patient portal called "MyChart".  Sign up information is provided on this After Visit Summary.  MyChart is used to connect with patients for Virtual Visits (Telemedicine).  Patients are able to view lab/test results, encounter notes, upcoming appointments, etc.  Non-urgent messages can be sent to your provider as well.   To learn more about what you can do with MyChart, go to [email or web address].    Your next appointment:   6 month(s)  The format for your next appointment:   In Person    Other Instructions ZIO  WHY IS MY DOCTOR PRESCRIBING  ZIO? The Zio system is proven and trusted by physicians to detect and diagnose irregular heart rhythms -- and has been prescribed to hundreds of thousands of patients.  The FDA has cleared the Zio system to monitor for many different kinds of irregular heart rhythms. In a study, physicians were able to reach a diagnosis 90% of the time with the Zio system1.  You can wear the Zio monitor -- a small, discreet, comfortable patch -- during your normal day-to-day activity, including while you sleep, shower, and exercise, while it records every single heartbeat for analysis.  1Barrett, P., et al. Comparison of 24 Hour Holter Monitoring Versus 14 Day Novel Adhesive Patch Electrocardiographic Monitoring. [place], 2014.  ZIO VS. HOLTER MONITORING The Zio monitor can be comfortably worn for up to 14 days. Holter monitors can be worn for 24 to 48 hours, limiting the time to record any irregular heart rhythms you may have. Zio is able to capture data for the 51% of patients who have their first symptom-triggered arrhythmia after 48 hours.1  LIVE WITHOUT RESTRICTIONS The Zio ambulatory cardiac monitor is a small, unobtrusive, and water-resistant patch--you might even forget you're wearing it. The Zio monitor records and stores every beat of your heart, whether you're sleeping, working out, or showering. Remove on: July 13th 2022  KardiaMobile [email or web address]        FDA-cleared, clinical grade mobile EKG monitor: Jodelle Red is the most clinically-validated mobile EKG used by the world's leading cardiac care medical professionals With Basic service, know instantly if your heart rhythm is normal or if atrial fibrillation is detected, and email the last single EKG recording to yourself or your doctor Premium service, available for purchase through the K[employer]ia app for $9.99 per month or $99 per year, includes unlimited history and storage of your EKG recordings,  a monthly EKG summary report to share with your doctor, along with the ability to track your blood pressure, activity and weight Includes one KardiaMobile phone clip FREE SHIPPING: Standard delivery 1-3 business days. Orders placed by 11:00am PST will ship that afternoon. Otherwise, will ship next business day. All orders ship via [employer] from [place], [place]        Adopting a Healthy Lifestyle.  Know what a healthy weight is for you (roughly BMI <25) and aim to maintain this   Aim for 7+ servings of fruits and vegetables daily   65-80+ fluid ounces of water or unsweet tea for healthy kidneys   Limit to max 1 drink of alcohol per day; avoid smoking/tobacco   Limit animal fats in diet for cholesterol and heart health - choose grass fed whenever available   Avoid highly processed foods, and foods high in saturated/trans fats   Aim for low stress - take time to unwind and care for your mental health   Aim for 150 min of moderate intensity exercise weekly for heart health, and weights twice weekly for bone health   Aim for 7-9 hours of sleep daily   When it comes to diets, agreement about the perfect plan isnt easy to find, even among the experts. Experts at the [employer] developed an idea known as the Healthy Eating Plate. Just imagine a plate divided into logical, healthy portions.   The emphasis is on diet quality:   Load up on vegetables and fruits - one-half of your plate: Aim for color and variety, and remember that potatoes dont count.   Go for whole grains - one-quarter of your plate: Whole wheat, barley, wheat berries, quinoa, oats, brown rice, and foods made with them. If you want pasta, go with whole wheat pasta.   Protein power - one-quarter of your plate: Fish, chicken, beans, and nuts are all healthy, versatile protein sources. Limit red meat.   The diet, however, does go beyond the plate, offering a few other suggestions.  Use healthy  plant oils, such as olive, canola, soy, corn, sunflower and peanut. Check the labels, and avoid partially hydrogenated oil, which have unhealthy trans fats.   If youre thirsty, drink water. Coffee and tea are good in moderation, but skip sugary drinks and limit milk and dairy products to one or two daily servings.   The type of carbohydrate in the diet is more important than the amount. Some sources of carbohydrates, such as vegetables, fruits, whole grains, and beans-are healthier than others.   Finally, stay active  Signed, Berniece Salines, DO  06/18/2021 4:56 PM    [facility]

## 2021-06-18 NOTE — Patient Instructions (Addendum)
Medication Instructions:   Your physician has recommended you make the following change in your medication:  START: Amlodipine 2.5 mg once daily   *If you need a refill on your cardiac medications before your next appointment, please call your pharmacy*   Lab Work: None If you have labs (blood work) drawn today and your tests are completely normal, you will receive your results only by: [place] (if you have MyChart) OR A paper copy in the mail If you have any lab test that is abnormal or we need to change your treatment, we will call you to review the results.   Testing/Procedures: A zio monitor was ordered today. It will remain on for 14 days. You will then return monitor and event diary in provided box. It takes 1-2 weeks for report to be downloaded and returned to [place]. We will call you with the results. If monitor falls off or has orange flashing light, please call Zio for further instructions.     Follow-Up: At [facility], you and your health needs are our priority.  As part of our continuing mission to provide you with exceptional heart care, we have created designated Provider Care Teams.  These Care Teams include your primary Cardiologist (physician) and Advanced Practice Providers (APPs -  Physician Assistants and Nurse Practitioners) who all work together to provide you with the care you need, when you need it.  We recommend signing up for the patient portal called "MyChart".  Sign up information is provided on this After Visit Summary.  MyChart is used to connect with patients for Virtual Visits (Telemedicine).  Patients are able to view lab/test results, encounter notes, upcoming appointments, etc.  Non-urgent messages can be sent to your provider as well.   To learn more about what you can do with MyChart, go to [email or web address].    Your next appointment:   6 month(s)  The format for your next appointment:   In Person    Other Instructions ZIO  WHY  IS MY DOCTOR PRESCRIBING ZIO? The Zio system is proven and trusted by physicians to detect and diagnose irregular heart rhythms -- and has been prescribed to hundreds of thousands of patients.  The FDA has cleared the Zio system to monitor for many different kinds of irregular heart rhythms. In a study, physicians were able to reach a diagnosis 90% of the time with the Zio system1.  You can wear the Zio monitor -- a small, discreet, comfortable patch -- during your normal day-to-day activity, including while you sleep, shower, and exercise, while it records every single heartbeat for analysis.  1Barrett, P., et al. Comparison of 24 Hour Holter Monitoring Versus 14 Day Novel Adhesive Patch Electrocardiographic Monitoring. [place], 2014.  ZIO VS. HOLTER MONITORING The Zio monitor can be comfortably worn for up to 14 days. Holter monitors can be worn for 24 to 48 hours, limiting the time to record any irregular heart rhythms you may have. Zio is able to capture data for the 51% of patients who have their first symptom-triggered arrhythmia after 48 hours.1  LIVE WITHOUT RESTRICTIONS The Zio ambulatory cardiac monitor is a small, unobtrusive, and water-resistant patch--you might even forget you're wearing it. The Zio monitor records and stores every beat of your heart, whether you're sleeping, working out, or showering. Remove on: July 13th 2022  KardiaMobile [email or web address]        FDA-cleared, clinical grade mobile EKG monitor: Jodelle Red is the most clinically-validated mobile EKG used by the  world's leading cardiac care medical professionals With Basic service, know instantly if your heart rhythm is normal or if atrial fibrillation is detected, and email the last single EKG recording to yourself or your doctor Premium service, available for purchase through the [employer] app for $9.99 per month or $99 per year, includes unlimited history and  storage of your EKG recordings, a monthly EKG summary report to share with your doctor, along with the ability to track your blood pressure, activity and weight Includes one KardiaMobile phone clip FREE SHIPPING: Standard delivery 1-3 business days. Orders placed by 11:00am PST will ship that afternoon. Otherwise, will ship next business day. All orders ship via [employer] from [place], [place]

## 2021-06-19 ENCOUNTER — Telehealth: Payer: Self-pay

## 2021-06-19 NOTE — Telephone Encounter (Signed)
Spoke with Judeen Hammans from [facility] she will reach out to patient to set up PFT.

## 2021-06-26 ENCOUNTER — Telehealth: Payer: Self-pay

## 2021-06-26 NOTE — Telephone Encounter (Signed)
Called [facility] resp department, left patient information on the answer machine to they can schedule her PFT.

## 2021-08-19 ENCOUNTER — Telehealth: Payer: Self-pay | Admitting: Cardiology

## 2021-08-19 MED ORDER — DILTIAZEM HCL ER COATED BEADS 120 MG PO CP24: 120.0000 mg | ORAL_CAPSULE | Freq: Every day | ORAL | 3 refills | Status: DC

## 2021-08-19 NOTE — Telephone Encounter (Signed)
Left message on patients voicemail to please return our call.   

## 2021-08-19 NOTE — Telephone Encounter (Signed)
Patient states she received a notification from Dr. Harriet Masson regarding medication changes. She is requesting a call back to go over the changes. Please return call to patient's work phone at [phone] when able.

## 2021-08-19 NOTE — Telephone Encounter (Signed)
Spoke to the patient just now and she let me know that she received a letter regarding Dr. Harriet Masson needing to make medication changes. I looked at the letter in Epic and went over the instructions with her. She verbalizes understanding and thanked me for calling her back.    Encouraged patient to call back with any questions or concerns.

## 2021-08-19 NOTE — Addendum Note (Signed)
Addended by: Resa Miner I on: 08/19/2021 10:29 AM   Modules accepted: Orders

## 2021-10-23 DIAGNOSIS — A6000 Herpesviral infection of urogenital system, unspecified: Secondary | ICD-10-CM | POA: Insufficient documentation

## 2021-10-23 DIAGNOSIS — D649 Anemia, unspecified: Secondary | ICD-10-CM | POA: Insufficient documentation

## 2021-10-23 DIAGNOSIS — F419 Anxiety disorder, unspecified: Secondary | ICD-10-CM | POA: Insufficient documentation

## 2021-10-23 DIAGNOSIS — K219 Gastro-esophageal reflux disease without esophagitis: Secondary | ICD-10-CM | POA: Insufficient documentation

## 2021-10-27 ENCOUNTER — Ambulatory Visit: Payer: 59 | Admitting: Cardiology

## 2021-12-11 IMAGING — CT CT ANGIO CHEST
2 of 10 series · 18 of 36 positions shown · IV contrast (Omnipaque)
Comparison: Radiographs 03/19/2019.  MRCP 05/04/2007.

CLINICAL DATA: Tachycardia and bilateral upper extremity numbness.
High clinical probability for acute pulmonary embolism.

EXAM:
CT ANGIOGRAPHY CHEST WITH CONTRAST
TECHNIQUE: Multidetector CT imaging of the chest was performed using the
standard protocol during bolus administration of intravenous
contrast. Multiplanar CT image reconstructions and MIPs were
obtained to evaluate the vascular anatomy.
CONTRAST:  100mL OMNIPAQUE IOHEXOL 350 MG/ML SOLN

[Series 8: pe coronal mpr · coronal · 0.62mm/px · 1 of 143 slices shown]
[im 72/143  mediastinal]
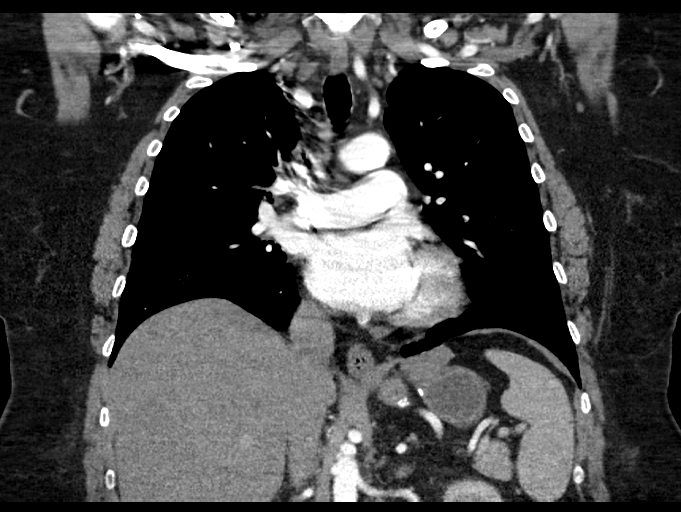

[Series 12: pe thins · axial · 0.76mm/px · z∈[-229,+39]mm · 17 of 302 slices shown]
[im 17/302  lung]
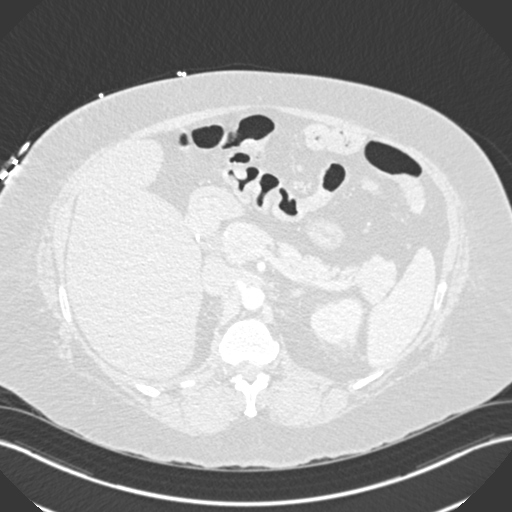
[im 34/302  mediastinal]
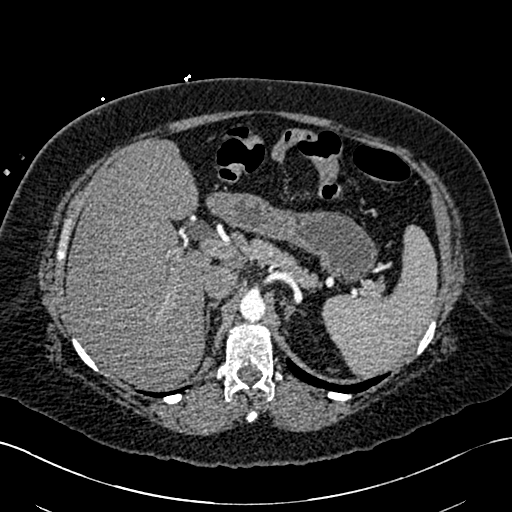
[im 51/302  lung]
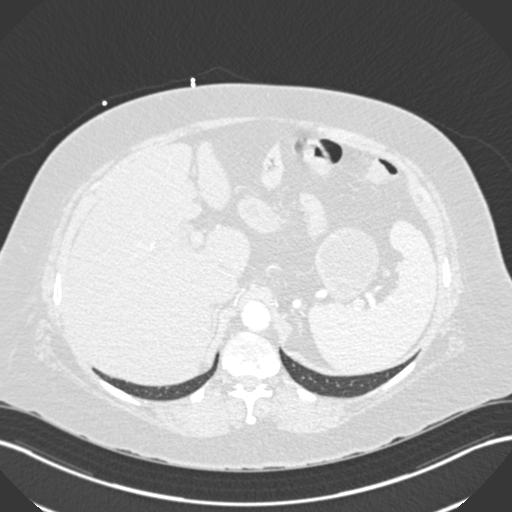
[im 67/302  mediastinal]
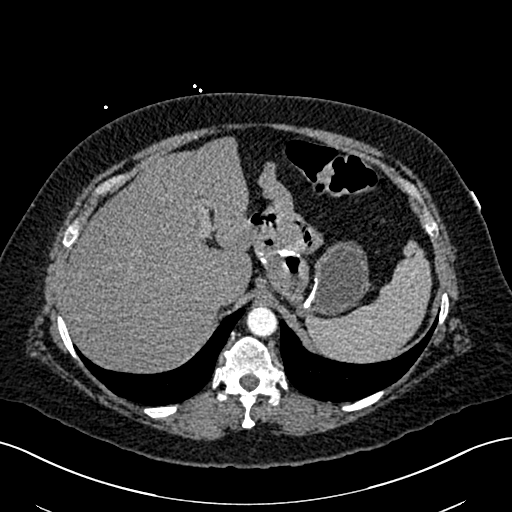
[im 84/302  lung]
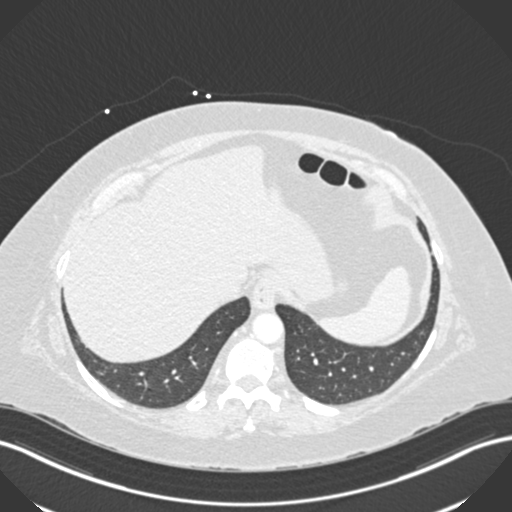
[im 101/302  mediastinal]
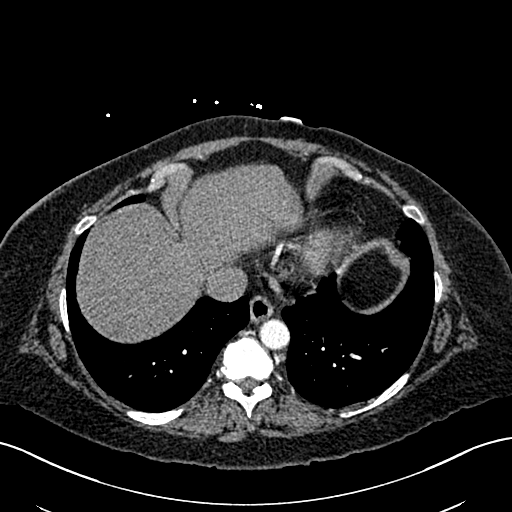
[im 118/302  lung]
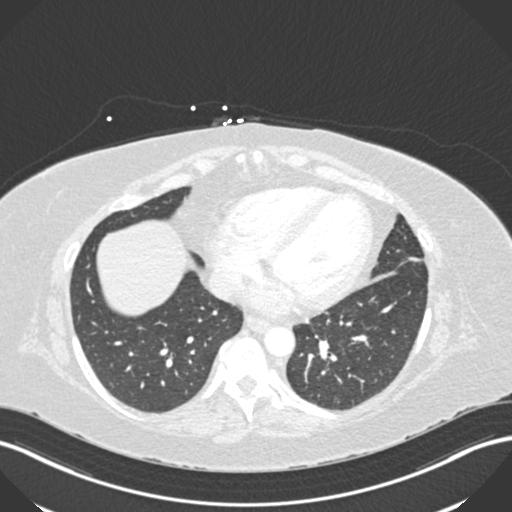
[im 134/302  mediastinal]
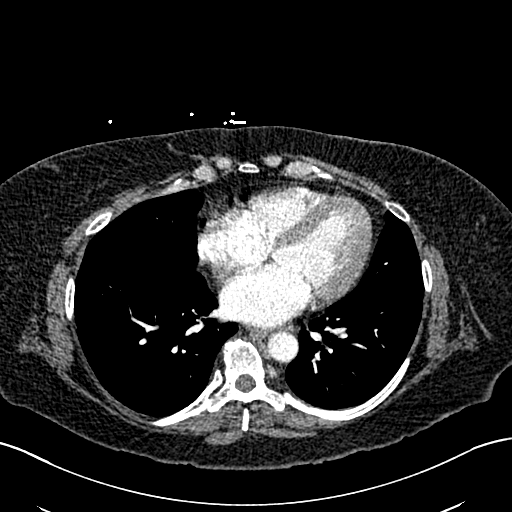
[im 151/302  lung]
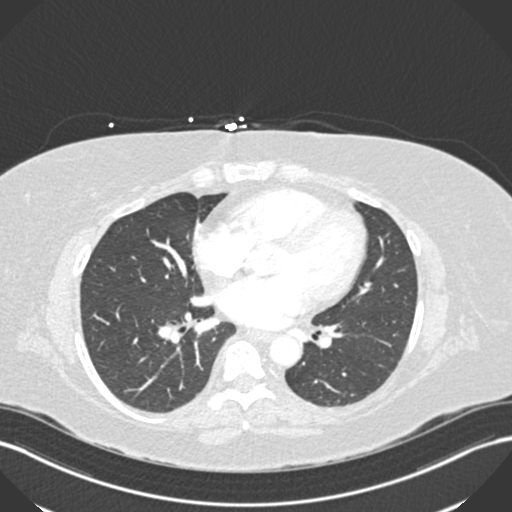
[im 168/302  mediastinal]
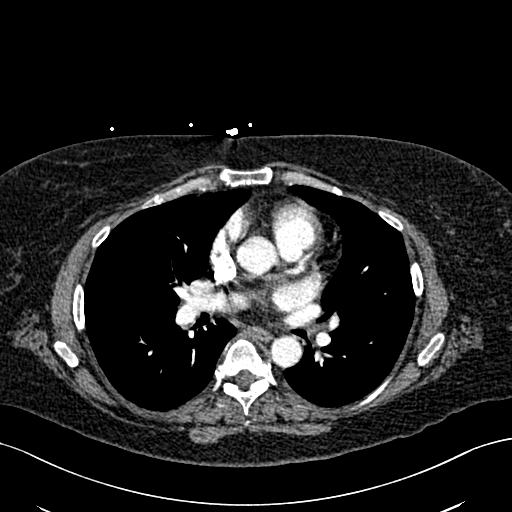
[im 184/302  lung]
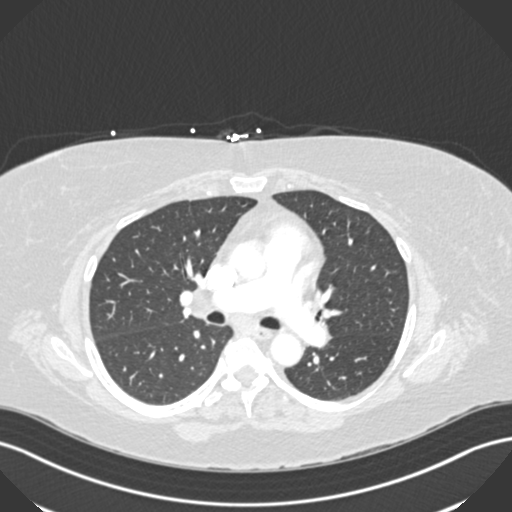
[im 201/302  mediastinal]
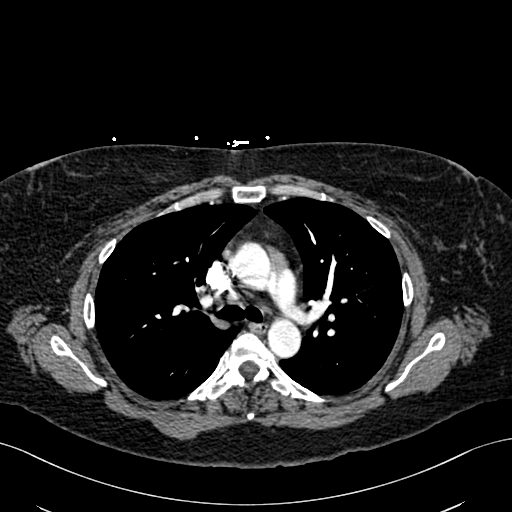
[im 218/302  lung]
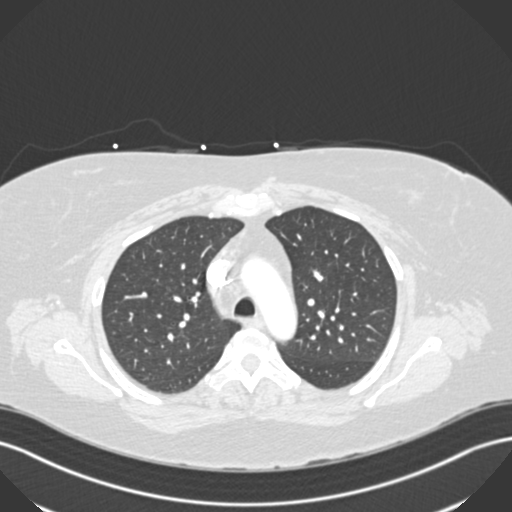
[im 235/302  mediastinal]
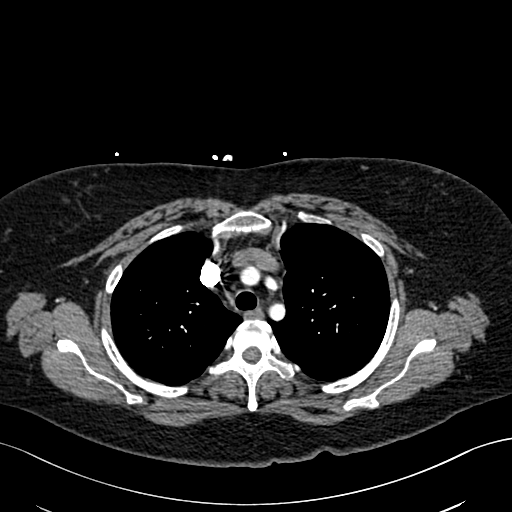
[im 251/302  lung]
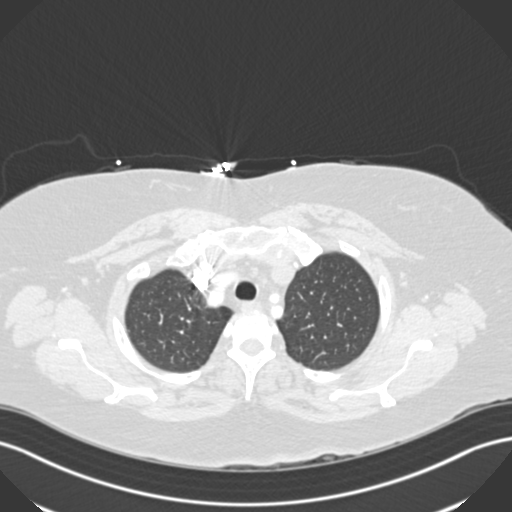
[im 268/302  mediastinal]
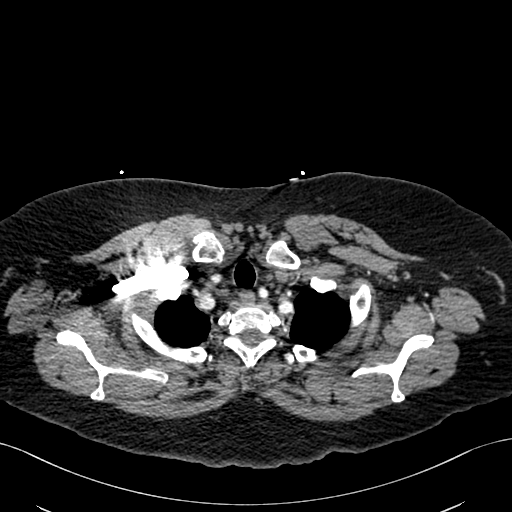
[im 285/302  lung]
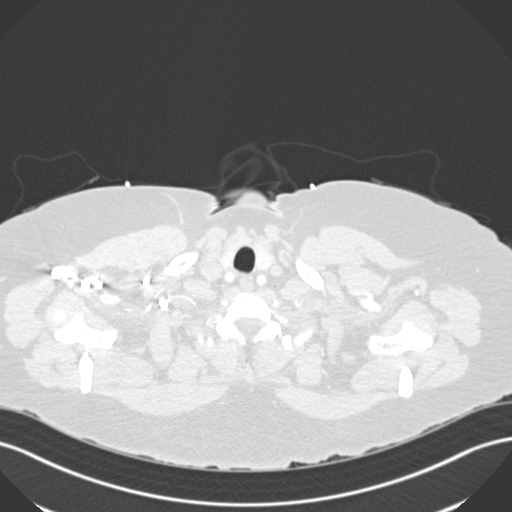

[18 of 36 positions shown; findings below may reference images not displayed]

FINDINGS: Cardiovascular: The pulmonary arteries are well opacified with
contrast to the level of the subsegmental branches. There is no
evidence of acute pulmonary embolism. Minimal aortic atherosclerosis
without acute vascular findings. The heart size is normal. There is
no pericardial effusion.

Mediastinum/Nodes: There are no enlarged mediastinal, hilar or
axillary lymph nodes. The thyroid gland, trachea and esophagus
demonstrate no significant findings.

Lungs/Pleura: There is no pleural effusion or pneumothorax. The
lungs are clear.

Upper abdomen: Postsurgical changes consistent with previous gastric
bypass surgery and cholecystectomy. Mild extrahepatic biliary
dilatation is similar to previous study and likely physiologic.
There is hepatic low density consistent with steatosis.

Musculoskeletal/Chest wall: There is no chest wall mass or
suspicious osseous finding. Mild degenerative changes in the spine.

Review of the MIP images confirms the above findings.
IMPRESSION: 1. No evidence of acute pulmonary embolism or other acute chest
process.
2. Hepatic steatosis.
3. Mild extrahepatic biliary dilatation post cholecystectomy, likely
physiologic.

## 2022-04-22 ENCOUNTER — Encounter: Payer: Self-pay | Admitting: Family Medicine

## 2022-04-23 MED ORDER — ACAMPROSATE CALCIUM 333 MG PO TBEC: 333.0000 mg | DELAYED_RELEASE_TABLET | Freq: Three times a day (TID) | ORAL | 1 refills | Status: DC

## 2022-06-15 ENCOUNTER — Other Ambulatory Visit: Payer: Self-pay | Admitting: Cardiology

## 2022-06-15 DIAGNOSIS — R0602 Shortness of breath: Secondary | ICD-10-CM

## 2022-06-26 ENCOUNTER — Ambulatory Visit: Payer: 59 | Admitting: Medical

## 2022-06-26 ENCOUNTER — Other Ambulatory Visit: Payer: Self-pay | Admitting: Family Medicine

## 2022-06-26 VITALS — BP 161/73 | HR 90 | Resp 18 | Ht 63.0 in | Wt 224.0 lb

## 2022-06-26 DIAGNOSIS — R748 Abnormal levels of other serum enzymes: Secondary | ICD-10-CM | POA: Diagnosis not present

## 2022-06-26 DIAGNOSIS — I10 Essential (primary) hypertension: Secondary | ICD-10-CM | POA: Diagnosis not present

## 2022-06-26 DIAGNOSIS — R739 Hyperglycemia, unspecified: Secondary | ICD-10-CM

## 2022-06-26 DIAGNOSIS — F1010 Alcohol abuse, uncomplicated: Secondary | ICD-10-CM

## 2022-06-26 MED ORDER — SERTRALINE HCL 50 MG PO TABS: 50.0000 mg | ORAL_TABLET | Freq: Every day | ORAL | 3 refills | Status: DC

## 2022-06-26 NOTE — Progress Notes (Signed)
Subjective:    Patient ID: Martha Hampton, female    DOB: [birth year], 56 y.o.   MRN: [id number]  HPI  Pt in for desire to stop using alcohol. She called with this request to Dr. Etter Hampton and she had acamprosate called in. Pt states she has tried this for one week and noted no difference.   Pt states she drinks every night. She does this every night until she passes out. She states will drink straight vodka. Will go thru large bottle in 3 days. Started drinking 6 years ago when husband left her.  Pt has ocd tendency as well. Used to wash hand excessively.   Pt lives in retirement community where every body drinks.   Pt liver enzymes one year ago mild elevated.   Also sugar level mild elevated.   Pt has ocd. She used to be paxil 40 mg daily and she stopped month ago. Was not on anything else in past.   Review of Systems  Constitutional:  Negative for chills, fatigue and fever.  Respiratory:  Negative for cough, chest tightness, shortness of breath and wheezing.   Cardiovascular:  Negative for chest pain and palpitations.  Gastrointestinal:  Negative for anal bleeding and diarrhea.  Genitourinary:  Negative for dysuria.  Musculoskeletal:  Negative for back pain, myalgias and neck pain.  Skin:  Negative for rash.  Neurological:  Negative for dizziness, speech difficulty, weakness, numbness and headaches.  Hematological:  Negative for adenopathy. Does not bruise/bleed easily.  Psychiatric/Behavioral:  Negative for decreased concentration, dysphoric mood and suicidal ideas. The patient is not nervous/anxious.    Past Medical History:  Diagnosis Date   Anemia    iron deficiency   Anxiety    Genital herpes    GERD (gastroesophageal reflux disease)    Hyperlipidemia      Social History   Socioeconomic History   Marital status: Married    Spouse name: Not on file   Number of children: Not on file   Years of education: Not on file   Highest education level: Not on file   Occupational History   Not on file  Tobacco Use   Smoking status: Never   Smokeless tobacco: Never  Substance and Sexual Activity   Alcohol use: Yes    Comment: daily   Drug use: No   Sexual activity: Not on file  Other Topics Concern   Not on file  Social History Narrative   Not on file   Social Determinants of Health   Financial Resource Strain: Not on file  Food Insecurity: Not on file  Transportation Needs: Not on file  Physical Activity: Not on file  Stress: Not on file  Social Connections: Not on file  Intimate Partner Violence: Not on file    Past Surgical History:  Procedure Laterality Date   [place]   left   CHOLECYSTECTOMY  11-2005   COLONOSCOPY  01/06/2013   Procedure: COLONOSCOPY;  Surgeon: Beryle Beams, MD;  Location: [facility];  Service: Gastroenterology;  Laterality: N/A;   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 90 tablet 1   B Complex Vitamins (B COMPLEX 1 PO) Take by mouth daily.     cetirizine (ZYRTEC) 10 MG tablet Take 10 mg by mouth daily.     diltiazem (CARDIZEM CD) 120 MG 24 hr capsule Take 1 capsule (120 mg total) by mouth daily. 90 capsule 3   hydrochlorothiazide (HYDRODIURIL) 25 MG tablet Take 1 tablet (25 mg total) by mouth daily. 90 tablet 3   levonorgestrel (MIRENA) 20 MCG/DAY IUD 1 each by Intrauterine route once.     PARoxetine (PAXIL) 40 MG tablet Take 1 tablet (40 mg total) by mouth every  morning. 90 tablet 3   Current Facility-Administered Medications on File Prior to Visit  Medication Dose Route Frequency Provider Last Rate Last Admin   cyanocobalamin ((VITAMIN B-12)) injection 1,000 mcg  1,000 mcg Intramuscular Once Lowne Chase, Yvonne R, DO        BP (!) 161/73   Pulse 90   Resp 18   Ht '5\' 3"'$  (1.6 m)   Wt 224 lb (101.6 kg)   SpO2 99%   BMI 39.68 kg/m       Objective:   Physical Exam    General- No acute distress. Pleasant patient. Neck- Full range of motion, no jvd Lungs- Clear, even and unlabored. Heart- regular rate and rhythm. Neurologic- CNII- XII grossly intact.  Abdomen- soft, nt, nd, +bs, no rebound or guarding.     Assessment & Plan:   Patient Instructions  For alcohol abuse recommend that you use acamprosate 333 mg 2 tablets 3 times a day.  Pharmacist does agree that is the most commonly way it is prescribed.  I am thinking that it might take at least 7 days before it takes full effect.  Try to start tapering off on currently drinking.  OCD by history and your description.  You stopped Paxil about 1 month ago.  I will prescribe sertraline 50 mg tablet to take daily.  However would recommend not starting immediately but waiting 1 to 2 weeks.  If you are successful in tapering off of alcohol and feel like you begin to have normal withdrawal type symptoms might be able to give a taper dose of benzodiazepine.  Htn- on review not on bp medication diltiazem. Please refill that and start. Rx in epic states you have 90 tab refill. Also have refill of hctz.  Follow-up in 10 to 14 days or sooner if needed.   Mackie Pai, PA-C

## 2022-06-26 NOTE — Patient Instructions (Addendum)
For alcohol abuse recommend that you use acamprosate 333 mg 2 tablets 3 times a day.  Pharmacist does agree that is the most commonly way it is prescribed.  I am thinking that it might take at least 7 days before it takes full effect.  Try to start tapering off on currently drinking.  OCD by history and your description.  You stopped Paxil about 1 month ago.  I will prescribe sertraline 50 mg tablet to take daily.  However would recommend not starting immediately but waiting 1 to 2 weeks.  If you are successful in tapering off of alcohol and feel like you begin to have normal withdrawal type symptoms might be able to give a taper dose of benzodiazepine.  Htn- on review not on bp medication diltiazem. Please refill that and start. Rx in epic states you have 90 tab refill. Also have refill of hctz.  Follow-up in 10 to 14 days or sooner if needed.

## 2022-07-08 ENCOUNTER — Ambulatory Visit: Payer: 59 | Admitting: Medical

## 2022-07-08 ENCOUNTER — Ambulatory Visit (HOSPITAL_BASED_OUTPATIENT_CLINIC_OR_DEPARTMENT_OTHER)
Admission: RE | Admit: 2022-07-08 | Discharge: 2022-07-08 | Disposition: A | Discharge status: Home / Self Care | Payer: 59 | Source: Ambulatory Visit | Attending: Medical | Admitting: Medical

## 2022-07-08 ENCOUNTER — Telehealth: Payer: Self-pay | Admitting: Family Medicine

## 2022-07-08 VITALS — BP 149/69 | HR 96 | Temp 98.0°F | Resp 16 | Ht 63.0 in | Wt 218.0 lb

## 2022-07-08 DIAGNOSIS — M79672 Pain in left foot: Secondary | ICD-10-CM | POA: Insufficient documentation

## 2022-07-08 DIAGNOSIS — J40 Bronchitis, not specified as acute or chronic: Secondary | ICD-10-CM

## 2022-07-08 DIAGNOSIS — S92402A Displaced unspecified fracture of left great toe, initial encounter for closed fracture: Secondary | ICD-10-CM

## 2022-07-08 DIAGNOSIS — R062 Wheezing: Secondary | ICD-10-CM | POA: Diagnosis not present

## 2022-07-08 DIAGNOSIS — J029 Acute pharyngitis, unspecified: Secondary | ICD-10-CM

## 2022-07-08 LAB — POCT RAPID STREP A (OFFICE): Rapid Strep A Screen: NEGATIVE

## 2022-07-08 LAB — POC COVID19 BINAXNOW: SARS Coronavirus 2 Ag: NEGATIVE

## 2022-07-08 MED ORDER — FLUTICASONE PROPIONATE 50 MCG/ACT NA SUSP: 2.0000 | Freq: Every day | NASAL | 1 refills | Status: DC

## 2022-07-08 MED ORDER — BENZONATATE 100 MG PO CAPS
100.0000 mg | ORAL_CAPSULE | Freq: Three times a day (TID) | ORAL | 0 refills | Status: DC | PRN
Start: 2022-07-08 — End: 2022-12-08

## 2022-07-08 MED ORDER — AZITHROMYCIN 250 MG PO TABS: ORAL_TABLET | ORAL | 0 refills | Status: AC

## 2022-07-08 MED ORDER — ALBUTEROL SULFATE HFA 108 (90 BASE) MCG/ACT IN AERS: 2.0000 | INHALATION_SPRAY | Freq: Four times a day (QID) | RESPIRATORY_TRACT | 0 refills | Status: DC | PRN

## 2022-07-08 NOTE — Addendum Note (Signed)
Addended by: Laure Kidney on: 07/08/2022 11:37 AM   Modules accepted: Orders

## 2022-07-08 NOTE — Addendum Note (Signed)
Addended by: Anabel Halon on: 07/08/2022 03:36 PM   Modules accepted: Orders

## 2022-07-08 NOTE — Patient Instructions (Addendum)
Bronchitis and pharyngitis(some sinus pressure as well). Rapid testing negative today in office. Moderate cough with rare wheeze.   Rx flonase for nasal congestion.  Will prescribe azithromycin, benzonatate and refilled your albuterol inhaler.  If cough worsens or chest congestion the recommend getting chest xray.  For left foot pain get xray today.   Follow up as regularly scheduled or sooner if needed or the above.

## 2022-07-08 NOTE — Progress Notes (Signed)
Subjective:    Patient ID: Martha Hampton, female    DOB: 11-26-1966, 56 y.o.   MRN: [id number]  HPI Pt in for some a lot of pnd and productive cough. Most of time she is swallowing mucus after coughing. Some mild- moderate st.  Minimal intermittent wheeze. Rare and ocasional. Hx of asthma as child and will flare if gets sick.   Pt had negative covid test and negative strep test in office.  Gets bronchitis easily 2-3 times a year.   Pt also stubbed her left great toe on Sunday. Accidentally hit dock when getting of boat. Pt found old post op shoe that she is using.      Review of Systems  Constitutional:  Negative for chills, fatigue and fever.  HENT:  Positive for congestion, sinus pressure and sinus pain. Negative for ear pain, hearing loss and mouth sores.   Respiratory:  Negative for cough, chest tightness, shortness of breath and wheezing.   Cardiovascular:  Negative for chest pain and palpitations.  Gastrointestinal:  Negative for abdominal pain, anal bleeding, diarrhea, nausea and vomiting.  Musculoskeletal:  Negative for back pain, myalgias and neck stiffness.  Skin:  Negative for rash.  Neurological:  Negative for dizziness, syncope, weakness, numbness and headaches.  Hematological:  Negative for adenopathy. Does not bruise/bleed easily.  Psychiatric/Behavioral:  Negative for behavioral problems, dysphoric mood and self-injury. The patient is not nervous/anxious.     Past Medical History:  Diagnosis Date   Anemia    iron deficiency   Anxiety    Genital herpes    GERD (gastroesophageal reflux disease)    Hyperlipidemia      Social History   Socioeconomic History   Marital status: Married    Spouse name: Not on file   Number of children: Not on file   Years of education: Not on file   Highest education level: Not on file  Occupational History   Not on file  Tobacco Use   Smoking status: Never   Smokeless tobacco: Never  Substance and Sexual Activity    Alcohol use: Yes    Comment: daily   Drug use: No   Sexual activity: Not on file  Other Topics Concern   Not on file  Social History Narrative   Not on file   Social Determinants of Health   Financial Resource Strain: Not on file  Food Insecurity: Not on file  Transportation Needs: Not on file  Physical Activity: Not on file  Stress: Not on file  Social Connections: Not on file  Intimate Partner Violence: Not on file    Past Surgical History:  Procedure Laterality Date   [place]   left   CHOLECYSTECTOMY  11-2005   COLONOSCOPY  01/06/2013   Procedure: COLONOSCOPY;  Surgeon: Beryle Beams, MD;  Location: [facility];  Service: Gastroenterology;  Laterality: N/A;   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 90 tablet 1   B Complex Vitamins (B COMPLEX 1 PO) Take by mouth daily.     cetirizine (ZYRTEC) 10 MG tablet Take 10 mg by mouth daily.     hydrochlorothiazide (HYDRODIURIL) 25 MG tablet TAKE 1 TABLET (25 MG TOTAL) BY MOUTH DAILY. 30 tablet 11   levonorgestrel (MIRENA) 20 MCG/DAY IUD 1 each by Intrauterine route once.     sertraline (ZOLOFT) 50 MG tablet Take 1 tablet (50 mg total) by mouth daily. 30 tablet 3   diltiazem (CARDIZEM CD) 120 MG 24 hr capsule Take 1 capsule (120 mg total) by mouth daily. 90 capsule 3   Current Facility-Administered Medications on File Prior to Visit  Medication Dose Route Frequency Provider Last Rate  Last Admin   cyanocobalamin ((VITAMIN B-12)) injection 1,000 mcg  1,000 mcg Intramuscular Once Lowne Chase, Yvonne R, DO        BP (!) 149/69 (BP Location: Right Arm, Patient Position: Sitting, Cuff Size: Normal)   Pulse 96   Temp 98 F (36.7 C) (Oral)   Resp 16   Ht '5\' 3"'$  (1.6 m)   Wt 218 lb (98.9 kg)   SpO2 98%   BMI 38.62 kg/m        Objective:   Physical Exam  General- No acute distress. Pleasant patient. Neck- Full range of motion, no jvd Lungs- Clear except for faint rough breath sounds at bases, even and unlabored. Heart- regular rate and rhythm. Neurologic- CNII- XII grossly intact.   Left foot- red, bruised left great toe and tender distal 1st metatarsal.  Heent- mild frontal sinus pressure.      Assessment & Plan:   Patient Instructions  Bronchitis and pharyngitis. Rapid testing negative today in office. Moderate cough with rare wheeze.   Will prescribe azithromycin, benzonatate and refilled your albuterol inhaler.  If cough worsens or chest congestion the recommend getting chest xray.  For left foot pain get xray today.   Follow up as regularly scheduled or sooner if needed or the above.   Mackie Pai, PA-C

## 2022-07-08 NOTE — Telephone Encounter (Signed)
Patient notified that referral was placed

## 2022-07-08 NOTE — Telephone Encounter (Signed)
Pt called in reference to Edward's note on her 7.19.23 xray:    Pt stated she would like to have the referral put in to sports medicine when possible.

## 2022-07-08 NOTE — Telephone Encounter (Signed)
Referral pended, not sure what dx to use.

## 2022-07-09 ENCOUNTER — Ambulatory Visit: Payer: 59 | Admitting: Family Medicine

## 2022-07-09 ENCOUNTER — Encounter: Payer: Self-pay | Admitting: Family Medicine

## 2022-07-09 VITALS — BP 120/68 | Ht 63.0 in | Wt 218.0 lb

## 2022-07-09 DIAGNOSIS — S92412A Displaced fracture of proximal phalanx of left great toe, initial encounter for closed fracture: Secondary | ICD-10-CM

## 2022-07-09 DIAGNOSIS — E2839 Other primary ovarian failure: Secondary | ICD-10-CM | POA: Diagnosis not present

## 2022-07-09 HISTORY — DX: Displaced fracture of proximal phalanx of left great toe, initial encounter for closed fracture: S92.412A

## 2022-07-09 NOTE — Assessment & Plan Note (Signed)
Acutely occurring with initial injury on 7/16.  She self reduced her initial injury. -Counseled on home exercise therapy and supportive care. -Continue postop shoe. -Follow-up in reimage.

## 2022-07-09 NOTE — Patient Instructions (Signed)
Nice to meet you Please continue elevation  Please use the hard soled shoe   Please send me a message in MyChart with any questions or updates.  Please see me back in 10 days.   --Dr. Raeford Razor

## 2022-07-09 NOTE — Progress Notes (Signed)
  Martha Hampton - 56 y.o. female MRN [id number]  Date of birth: 04/14/1966  SUBJECTIVE:  Including CC & ROS.  No chief complaint on file.   Martha Hampton is a 56 y.o. female that is presenting with left great toe fracture.  She had an injury on Sunday.  She stepped incorrectly and her toe bent backwards.  She had a self reduced the toe when this happened.  Now she is having altered sensation and pain at the proximal phalanx.  Independent review of the left great toe x-ray from 7/19 shows a comminuted proximal phalanx fracture.    Review of Systems See HPI   HISTORY: Past Medical, Surgical, Social, and Family History Reviewed & Updated per EMR.   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  She self reduced her initial injury. -Counseled on home exercise therapy and supportive care. -Continue postop shoe. -Follow-up in reimage.

## 2022-07-16 ENCOUNTER — Ambulatory Visit (HOSPITAL_BASED_OUTPATIENT_CLINIC_OR_DEPARTMENT_OTHER)
Admission: RE | Admit: 2022-07-16 | Discharge: 2022-07-16 | Disposition: A | Discharge status: Home / Self Care | Payer: 59 | Source: Ambulatory Visit | Attending: Family Medicine | Admitting: Family Medicine

## 2022-07-16 DIAGNOSIS — E2839 Other primary ovarian failure: Secondary | ICD-10-CM | POA: Diagnosis not present

## 2022-07-21 ENCOUNTER — Telehealth: Payer: Self-pay | Admitting: Family Medicine

## 2022-07-21 NOTE — Telephone Encounter (Signed)
Unable to leave VM for patient. If she calls back please have her speak with a nurse/CMA and inform that her bone density exam is normal.   If any questions then please take the best time and phone number to call and I will try to call her back.   Rosemarie Ax, MD [facility] 07/21/2022, 4:45 PM

## 2022-07-23 ENCOUNTER — Ambulatory Visit: Payer: 59 | Admitting: Family Medicine

## 2022-09-16 ENCOUNTER — Other Ambulatory Visit: Payer: Self-pay | Admitting: Family Medicine

## 2022-10-08 ENCOUNTER — Ambulatory Visit (HOSPITAL_BASED_OUTPATIENT_CLINIC_OR_DEPARTMENT_OTHER)
Admission: RE | Admit: 2022-10-08 | Discharge: 2022-10-08 | Disposition: A | Discharge status: Home / Self Care | Payer: 59 | Source: Ambulatory Visit | Attending: Family Medicine | Admitting: Family Medicine

## 2022-10-08 ENCOUNTER — Other Ambulatory Visit: Payer: Self-pay | Admitting: Family Medicine

## 2022-10-08 ENCOUNTER — Telehealth: Payer: Self-pay | Admitting: Family Medicine

## 2022-10-08 ENCOUNTER — Ambulatory Visit: Payer: 59 | Admitting: Medical

## 2022-10-08 DIAGNOSIS — N289 Disorder of kidney and ureter, unspecified: Secondary | ICD-10-CM | POA: Diagnosis present

## 2022-10-08 NOTE — Telephone Encounter (Signed)
Pt stated she was seen at an UC and they found a lesion on her R kidney and recommend an [place]. She stated they asked if pcp could order this. She wanted to get it done closer to her house as she does not live here, but stated she will come to our facility if she has to. She stated it is very important she gets it done today.

## 2022-10-08 NOTE — Telephone Encounter (Signed)
Patient called back. She lives a hr and a half away so needs enough time to get here to have imaging done. Please let her know as soon as possible.

## 2022-10-09 ENCOUNTER — Telehealth: Payer: Self-pay | Admitting: Family Medicine

## 2022-10-09 NOTE — Telephone Encounter (Signed)
Pt had ultrasound done yesterday

## 2022-10-09 NOTE — Telephone Encounter (Signed)
Patient called to say she tried to send Dr. Etter Sjogren a message back to let her know she was under the impression that the ED would send her imaging and visit notes to Dr. Etter Sjogren since she told them she was her PCP. Reviewed Care Everywhere and patient's abdomen CT is in the note from 10/08/2022 [facility]. Copy also placed in provider's box for review. Please call patient to advise results of most recent imaging.

## 2022-10-09 NOTE — Telephone Encounter (Signed)
Received. Placed in folder 

## 2022-10-13 NOTE — Telephone Encounter (Signed)
Pt made aware

## 2022-10-16 ENCOUNTER — Telehealth: Payer: Self-pay | Admitting: Family Medicine

## 2022-10-16 MED ORDER — DILTIAZEM HCL ER COATED BEADS 120 MG PO CP24: 120.0000 mg | ORAL_CAPSULE | Freq: Every day | ORAL | 0 refills | Status: DC

## 2022-10-16 NOTE — Telephone Encounter (Signed)
Medication:   diltiazem (CARDIZEM CD) 120 MG 24 hr capsule [[id number]]   Has the patient contacted their pharmacy? No. (If no, request that the patient contact the pharmacy for the refill.) (If yes, when and what did the pharmacy advise?)  Preferred Pharmacy (with phone number or street name):   [facility], [place], [place] [address] P: [phone]  Agent: Please be advised that RX refills may take up to 3 business days. We ask that you follow-up with your pharmacy.

## 2022-10-16 NOTE — Telephone Encounter (Signed)
Refill sent.

## 2022-12-03 LAB — HM PAP SMEAR
HM Pap smear: NORMAL
HPV, high-risk: NEGATIVE

## 2022-12-04 ENCOUNTER — Emergency Department (HOSPITAL_BASED_OUTPATIENT_CLINIC_OR_DEPARTMENT_OTHER)
Admission: EM | Admit: 2022-12-04 | Discharge: 2022-12-05 | Disposition: A | Discharge status: Home / Self Care | Payer: 59 | Attending: Emergency Medicine | Admitting: Emergency Medicine

## 2022-12-04 ENCOUNTER — Emergency Department (HOSPITAL_BASED_OUTPATIENT_CLINIC_OR_DEPARTMENT_OTHER): Payer: 59

## 2022-12-04 ENCOUNTER — Other Ambulatory Visit: Payer: Self-pay

## 2022-12-04 ENCOUNTER — Emergency Department (HOSPITAL_COMMUNITY): Payer: 59

## 2022-12-04 DIAGNOSIS — R202 Paresthesia of skin: Secondary | ICD-10-CM

## 2022-12-04 DIAGNOSIS — I10 Essential (primary) hypertension: Secondary | ICD-10-CM

## 2022-12-04 DIAGNOSIS — R200 Anesthesia of skin: Secondary | ICD-10-CM | POA: Diagnosis present

## 2022-12-04 LAB — COMPREHENSIVE METABOLIC PANEL
ALT: 19 U/L (ref 0–44)
AST: 31 U/L (ref 15–41)
Albumin: 4.2 g/dL (ref 3.5–5.0)
Alkaline Phosphatase: 113 U/L (ref 38–126)
Anion gap: 9 (ref 5–15)
BUN: 15 mg/dL (ref 6–20)
CO2: 28 mmol/L (ref 22–32)
Calcium: 9.1 mg/dL (ref 8.9–10.3)
Chloride: 101 mmol/L (ref 98–111)
Creatinine, Ser: 0.82 mg/dL (ref 0.44–1.00)
GFR, Estimated: >60 mL/min (ref >60)
Glucose, Bld: 91 mg/dL (ref 70–99)
Potassium: 3.5 mmol/L (ref 3.5–5.1)
Sodium: 138 mmol/L (ref 135–145)
Total Bilirubin: 0.5 mg/dL (ref 0.3–1.2)
Total Protein: 7.7 g/dL (ref 6.5–8.1)

## 2022-12-04 LAB — CBC
HCT: 41.7 % (ref 36.0–46.0)
Hemoglobin: 14.2 g/dL (ref 12.0–15.0)
MCH: 32.3 pg (ref 26.0–34.0)
MCHC: 34.1 g/dL (ref 30.0–36.0)
MCV: 94.8 fL (ref 80.0–100.0)
Platelets: 249 10*3/uL (ref 150–400)
RBC: 4.4 MIL/uL (ref 3.87–5.11)
RDW: 14.2 % (ref 11.5–15.5)
WBC: 6.8 10*3/uL (ref 4.0–10.5)
nRBC: 0 % (ref 0.0–0.2)

## 2022-12-04 LAB — DIFFERENTIAL
Abs Immature Granulocytes: 0.02 10*3/uL (ref 0.00–0.07)
Basophils Absolute: 0 10*3/uL (ref 0.0–0.1)
Basophils Relative: 0 %
Eosinophils Absolute: 0.1 10*3/uL (ref 0.0–0.5)
Eosinophils Relative: 1 %
Immature Granulocytes: 0 %
Lymphocytes Relative: 31 %
Lymphs Abs: 2.1 10*3/uL (ref 0.7–4.0)
Monocytes Absolute: 0.6 10*3/uL (ref 0.1–1.0)
Monocytes Relative: 9 %
Neutro Abs: 4 10*3/uL (ref 1.7–7.7)
Neutrophils Relative %: 59 %

## 2022-12-04 LAB — APTT: aPTT: 26 seconds (ref 24–36)

## 2022-12-04 LAB — PROTIME-INR
INR: 1 (ref 0.8–1.2)
Prothrombin Time: 13 seconds (ref 11.4–15.2)

## 2022-12-04 LAB — ETHANOL: Alcohol, Ethyl (B): <10 mg/dL (ref <10)

## 2022-12-04 LAB — CBG MONITORING, ED: Glucose-Capillary: 92 mg/dL (ref 70–99)

## 2022-12-04 LAB — PREGNANCY, URINE: Preg Test, Ur: NEGATIVE

## 2022-12-04 NOTE — ED Notes (Signed)
This RN called MRI, approximate plan for scan at 2200

## 2022-12-04 NOTE — ED Triage Notes (Addendum)
Pt POV steady gait- reports elevated BP, working with PCP to adjust BP meds. Reports new onset right arm tingling yesterday, went away over night, returned this morning and progressing up right arm to right face.   Denies chest pain, new ShOB, blurred vision.   VAN neg.

## 2022-12-04 NOTE — ED Provider Notes (Signed)
[facility] EMERGENCY DEPARTMENT Provider Note   CSN: [id number] Arrival date & time: 12/04/22  1728     History  Chief Complaint  Patient presents with   Tingling   Numbness    Martha Hampton is a 56 y.o. female.  Patient is a 56 year old female with a history of hypertension who is presenting today with complaints of tingling sensation in the right side of her face arm and leg.  She reports that she has noticed for a while now that her blood pressure has been elevated.  About a month and a half ago she went back on Cardizem but has still been having elevated pressure with being compliant with the medication between 160/90 up to 200/100.  She went to work yesterday and noticed when she was at work she was having a tingling sensation in her right arm.  She did not have any pain but noticed it started moving up into her neck and it was still present when she went to bed.  When she got up this morning and was at work she noticed that the tingling sensation was in the right side of her face and her leg.  She has not had any coordination issues with her arm or leg.  She has not dropped anything or tripped.  She has not had any weakness or difficulty with speech or swallowing.  She denies prior history of similar symptoms.  No headache or neck pain.  The history is provided by the patient.       Home Medications Prior to Admission medications   Medication Sig Start Date End Date Taking? Authorizing Provider  acamprosate (CAMPRAL) 333 MG tablet Take 1 tablet (333 mg total) by mouth 3 (three) times daily with meals. 04/23/22   Ann Held, DO  albuterol (VENTOLIN HFA) 108 (90 Base) MCG/ACT inhaler Inhale 2 puffs into the lungs every 6 (six) hours as needed. 07/08/22   Saguier, Percell Miller, PA-C  B Complex Vitamins (B COMPLEX 1 PO) Take by mouth daily.    [provider]  benzonatate (TESSALON) 100 MG capsule Take 1 capsule (100 mg total) by mouth 3 (three) times daily  as needed for cough. 07/08/22   Saguier, Percell Miller, PA-C  cetirizine (ZYRTEC) 10 MG tablet Take 10 mg by mouth daily.    [provider]  diltiazem (CARDIZEM CD) 120 MG 24 hr capsule Take 1 capsule (120 mg total) by mouth daily. 10/16/22   [name] R, DO  fluticasone (FLONASE) 50 MCG/ACT nasal spray Place 2 sprays into both nostrils daily. 07/08/22   Saguier, Percell Miller, PA-C  hydrochlorothiazide (HYDRODIURIL) 25 MG tablet TAKE 1 TABLET (25 MG TOTAL) BY MOUTH DAILY. 06/26/22   Ann Held, DO  levonorgestrel (MIRENA) 20 MCG/DAY IUD 1 each by Intrauterine route once.    [provider]  sertraline (ZOLOFT) 50 MG tablet Take 1 tablet (50 mg total) by mouth daily. 06/26/22   Saguier, Percell Miller, PA-C      Allergies    Latex; Anesthetics, amide; and Penicillins    Review of Systems   Review of Systems  Physical Exam Updated Vital Signs BP (!) 171/94   Pulse 87   Temp 98.6 F (37 C) (Oral)   Resp 18   Ht 5' 2.5" (1.588 m)   Wt 105.7 kg   SpO2 99%   BMI 41.94 kg/m  Physical Exam Vitals and nursing note reviewed.  Constitutional:      General: She is not  in acute distress.    Appearance: She is well-developed.  HENT:     Head: Normocephalic and atraumatic.  Eyes:     Conjunctiva/sclera: Conjunctivae normal.     Pupils: Pupils are equal, round, and reactive to light.  Cardiovascular:     Rate and Rhythm: Normal rate and regular rhythm.     Heart sounds: No murmur heard. Pulmonary:     Effort: Pulmonary effort is normal. No respiratory distress.     Breath sounds: Normal breath sounds. No wheezing or rales.  Abdominal:     General: There is no distension.     Palpations: Abdomen is soft.     Tenderness: There is no abdominal tenderness. There is no guarding or rebound.  Musculoskeletal:        General: No tenderness. Normal range of motion.     Cervical back: Normal range of motion and neck supple.  Skin:    General: Skin is warm and dry.     Findings:  No erythema or rash.  Neurological:     Mental Status: She is alert and oriented to person, place, and time. Mental status is at baseline.     Cranial Nerves: No cranial nerve deficit.     Sensory: No sensory deficit.     Motor: No weakness.     Coordination: Coordination normal.     Gait: Gait normal.  Psychiatric:        Mood and Affect: Mood normal.        Behavior: Behavior normal.     ED Results / Procedures / Treatments   Labs (all labs ordered are listed, but only abnormal results are displayed) Labs Reviewed  PROTIME-INR  APTT  CBC  DIFFERENTIAL  COMPREHENSIVE METABOLIC PANEL  ETHANOL  PREGNANCY, URINE  CBG MONITORING, ED    EKG EKG Interpretation  Date/Time:  Friday December 04 2022 18:05:37 EST Ventricular Rate:  82 PR Interval:  153 QRS Duration: 88 QT Interval:  384 QTC Calculation: 449 R Axis:   30 Text Interpretation: Sinus rhythm Normal ECG Confirmed by Blanchie Dessert [id number]) on 12/04/2022 7:18:25 PM  Radiology CT HEAD WO CONTRAST  Result Date: 12/04/2022 CLINICAL DATA:  Neuro deficit, acute, stroke suspected EXAM: CT HEAD WITHOUT CONTRAST TECHNIQUE: Contiguous axial images were obtained from the base of the skull through the vertex without intravenous contrast. RADIATION DOSE REDUCTION: This exam was performed according to the departmental dose-optimization program which includes automated exposure control, adjustment of the mA and/or kV according to patient size and/or use of iterative reconstruction technique. COMPARISON:  MRI head 08/25/2013 FINDINGS: Brain: No evidence of large-territorial acute infarction. No parenchymal hemorrhage. No mass lesion. No extra-axial collection. No mass effect or midline shift. No hydrocephalus. Basilar cisterns are patent. Vascular: No hyperdense vessel. Skull: No acute fracture or focal lesion. Sinuses/Orbits: Paranasal sinuses and mastoid air cells are clear. The orbits are unremarkable. Other: None. IMPRESSION: No  acute intracranial abnormality. Electronically Signed   By: Iven Finn M.D.   On: 12/04/2022 18:52    Procedures Procedures    Medications Ordered in ED Medications - No data to display  ED Course/ Medical Decision Making/ A&P                           Medical Decision Making Amount and/or Complexity of Data Reviewed External Data Reviewed: notes. Labs: ordered. Decision-making details documented in ED Course. Radiology: ordered and independent interpretation performed. Decision-making details documented in ED  Course. ECG/medicine tests: ordered and independent interpretation performed. Decision-making details documented in ED Course.   Pt with multiple medical problems and comorbidities and presenting today with a complaint that caries a high risk for morbidity and mortality.  Presenting here with symptoms concerning for stroke.  She is having a tingling sensation in the right side of her body.  NIH of 0 at this time.  However patient is hypertensive and does have risk factors.  She does report a prior history of palpitations and wore a monitor and had an echo but did not have A-fib at that time.  She is hypertensive here today but is in no acute distress.  Low suspicion for MS, migraine, cervical pathology.  Stroke workup initiated but feel that patient will need an MRI for definitive evaluation. 8:00 PM I interpreted patient's labs and EKG today and CBC, CMP, coags, EtOH are all negative.  EKG shows a normal sinus rhythm. I have independently visualized and interpreted pt's images today. Head CT without evidence of acute bleed and radiology reports no sign of stroke.  Patient symptoms are still present and feel that is important she gets an MRI to rule out small stroke.  Discussed with Dr. Lorretta Harp at [facility].  MRI was ordered.  Patient is going to go by POV.        Final Clinical Impression(s) / ED Diagnoses Final diagnoses:  Paresthesia    Rx / DC Orders ED Discharge Orders      None         Blanchie Dessert, MD 12/04/22 2000

## 2022-12-04 NOTE — ED Notes (Signed)
Pt taken to MRI  

## 2022-12-04 NOTE — ED Notes (Signed)
Pt in as transfer from [facility], still reporting intermittent numbness/tingling to R face, R arm and leg

## 2022-12-04 NOTE — ED Notes (Signed)
Patient transported to CT 

## 2022-12-04 NOTE — Discharge Instructions (Signed)
You were seen in the ER today for your tingling sensation.  Your physical exam and vital signs were very reassuring as was your blood work and your imaging.  There is no evidence of stroke on your MRI today.  While the exact cause of your symptoms remains unclear you have been tested for B12 deficiency and thyroid problems.  Please follow-up with your primary care doctor on these test results so that they may be treated as necessary.  Return to the ER with any new severe symptoms.

## 2022-12-04 NOTE — ED Provider Notes (Signed)
Martha Hampton Provider Note   CSN: [id number] Arrival date & time: 12/04/22  1728     History {Add pertinent medical, surgical, social history, OB history to HPI:1} Chief Complaint  Patient presents with   Tingling   Numbness    Martha Hampton is a 56 y.o. female.  HPI     Home Medications Prior to Admission medications   Medication Sig Start Date End Date Taking? Authorizing Provider  acamprosate (CAMPRAL) 333 MG tablet Take 1 tablet (333 mg total) by mouth 3 (three) times daily with meals. 04/23/22   Ann Held, DO  albuterol (VENTOLIN HFA) 108 (90 Base) MCG/ACT inhaler Inhale 2 puffs into the lungs every 6 (six) hours as needed. 07/08/22   Saguier, Percell Miller, PA-C  B Complex Vitamins (B COMPLEX 1 PO) Take by mouth daily.    [provider]  benzonatate (TESSALON) 100 MG capsule Take 1 capsule (100 mg total) by mouth 3 (three) times daily as needed for cough. 07/08/22   Saguier, Percell Miller, PA-C  cetirizine (ZYRTEC) 10 MG tablet Take 10 mg by mouth daily.    [provider]  diltiazem (CARDIZEM CD) 120 MG 24 hr capsule Take 1 capsule (120 mg total) by mouth daily. 10/16/22   [name] R, DO  fluticasone (FLONASE) 50 MCG/ACT nasal spray Place 2 sprays into both nostrils daily. 07/08/22   Saguier, Percell Miller, PA-C  hydrochlorothiazide (HYDRODIURIL) 25 MG tablet TAKE 1 TABLET (25 MG TOTAL) BY MOUTH DAILY. 06/26/22   Ann Held, DO  levonorgestrel (MIRENA) 20 MCG/DAY IUD 1 each by Intrauterine route once.    [provider]  sertraline (ZOLOFT) 50 MG tablet Take 1 tablet (50 mg total) by mouth daily. 06/26/22   Saguier, Percell Miller, PA-C      Allergies    Latex; Anesthetics, amide; and Penicillins    Review of Systems   Review of Systems  Physical Exam Updated Vital Signs BP (!) 170/90   Pulse 80   Temp 98.6 F (37 C) (Oral)   Resp 18   Ht 5' 2.5" (1.588 m)   Wt 105.7 kg   SpO2 96%   BMI 41.94  kg/m  Physical Exam  ED Results / Procedures / Treatments   Labs (all labs ordered are listed, but only abnormal results are displayed) Labs Reviewed  PROTIME-INR  APTT  CBC  DIFFERENTIAL  COMPREHENSIVE METABOLIC PANEL  ETHANOL  PREGNANCY, URINE  CBG MONITORING, ED    EKG EKG Interpretation  Date/Time:  Friday December 04 2022 18:05:37 EST Ventricular Rate:  82 PR Interval:  153 QRS Duration: 88 QT Interval:  384 QTC Calculation: 449 R Axis:   30 Text Interpretation: Sinus rhythm Normal ECG Confirmed by Blanchie Dessert [phone]) on 12/04/2022 7:18:25 PM  Radiology MR BRAIN WO CONTRAST  Result Date: 12/04/2022 CLINICAL DATA:  Acute neurologic deficit EXAM: MRI HEAD WITHOUT CONTRAST TECHNIQUE: Multiplanar, multiecho pulse sequences of the brain and surrounding structures were obtained without intravenous contrast. COMPARISON:  08/25/2013 FINDINGS: Brain: No acute infarct, mass effect or extra-axial collection. No acute or chronic hemorrhage. Normal white matter signal, parenchymal volume and CSF spaces. The midline structures are normal. Vascular: Major flow voids are preserved. Skull and upper cervical spine: Normal calvarium and skull base. Visualized upper cervical spine and soft tissues are normal. Sinuses/Orbits:No paranasal sinus fluid levels or advanced mucosal thickening. No mastoid or middle ear effusion. Normal orbits. IMPRESSION: Normal brain MRI. Electronically Signed   By: Lennette Bihari  [place] M.D.   On: 12/04/2022 23:16   CT HEAD WO CONTRAST  Result Date: 12/04/2022 CLINICAL DATA:  Neuro deficit, acute, stroke suspected EXAM: CT HEAD WITHOUT CONTRAST TECHNIQUE: Contiguous axial images were obtained from the base of the skull through the vertex without intravenous contrast. RADIATION DOSE REDUCTION: This exam was performed according to the departmental dose-optimization program which includes automated exposure control, adjustment of the mA and/or kV according to patient  size and/or use of iterative reconstruction technique. COMPARISON:  MRI head 08/25/2013 FINDINGS: Brain: No evidence of large-territorial acute infarction. No parenchymal hemorrhage. No mass lesion. No extra-axial collection. No mass effect or midline shift. No hydrocephalus. Basilar cisterns are patent. Vascular: No hyperdense vessel. Skull: No acute fracture or focal lesion. Sinuses/Orbits: Paranasal sinuses and mastoid air cells are clear. The orbits are unremarkable. Other: None. IMPRESSION: No acute intracranial abnormality. Electronically Signed   By: Iven Finn M.D.   On: 12/04/2022 18:52    Procedures Procedures  {Document cardiac monitor, telemetry assessment procedure when appropriate:1}  Medications Ordered in ED Medications - No data to display  ED Course/ Medical Decision Making/ A&P Clinical Course as of 12/04/22 2355  Fri Dec 04, 2022  2347 MR BRAIN WO CONTRAST [RS]    Clinical Course User Index [RS] Yvonne Kendall Gypsy Balsam, PA-C                           Medical Decision Making Amount and/or Complexity of Data Reviewed Labs: ordered. Radiology: ordered. Decision-making details documented in ED Course.   ***  {Document critical care time when appropriate:1} {Document review of labs and clinical decision tools ie heart score, Chads2Vasc2 etc:1}  {Document your independent review of radiology images, and any outside records:1} {Document your discussion with family members, caretakers, and with consultants:1} {Document social determinants of health affecting pt's care:1} {Document your decision making why or why not admission, treatments were needed:1} Final Clinical Impression(s) / ED Diagnoses Final diagnoses:  Paresthesia  Uncontrolled hypertension    Rx / DC Orders ED Discharge Orders     None

## 2022-12-05 LAB — VITAMIN B12: Vitamin B-12: 89 pg/mL — ABNORMAL LOW (ref 180–914)

## 2022-12-05 LAB — TSH: TSH: 2.359 u[IU]/mL (ref 0.350–4.500)

## 2022-12-05 NOTE — ED Notes (Signed)
ED provider at bedside.

## 2022-12-05 NOTE — ED Notes (Signed)
Numbness and tingling to rt side that started 2 days ago, yesterday started with numbness and tingling in the face as well. Denies any pain, N/V, or any other symptoms

## 2022-12-07 ENCOUNTER — Telehealth: Payer: Self-pay | Admitting: Family Medicine

## 2022-12-07 NOTE — Telephone Encounter (Signed)
Return Phone Number [phone] (Primary) Chief Complaint NUMBNESS/TINGLING- sudden on one side of the body or face Reason for Call Symptomatic / Request for [place] states she has tingling in her leg and foot on her right side. Pt did go to the ed over the weekend. Translation No Nurse Assessment Nurse: Waymond Cera, RN, Benjamine Mola Date/Time (Eastern Time): 12/07/2022 9:54:41 AM Confirm and document reason for call. If symptomatic, describe symptoms. ---Caller states tingling on the right side of her body (face,neck,arm,leg, and foot.)  Final Disposition 12/07/2022 10:00:14 AM Go to ED Now (or PCP triage) Yes Cantrell, RN, Dalene Carrow User: Sharol Given, RN Date/Time Eilene Ghazi Time): 12/07/2022 9:57:20 AM States bp was elevated in the ER and has been elevated this week others as well. User: Sharol Given, RN Date/Time Eilene Ghazi Time): 12/07/2022 10:02:44 AM Caller states that she doesn't want to go to the ER now. States that she has already been seen there, and she doesn't know what else they would do. States that she is wanting to speak with her provider. Advised risk of not going now is getting worse/possible death. Verbalizes understanding. User: Sharol Given, RN Date/Time Eilene Ghazi Time): 12/07/2022 10:04:48 AM Caller states that she was seen in UC and sent to ER. States MRI was negative. States that they told her it was a vitamin deficiency, but she is not sure. User: Sharol Given, RN Date/Time Eilene Ghazi Time): 12/07/2022 10:05:13 AM Called backline and advised of situation. States that she will call pt back.  Referrals GO TO FACILITY REFUSED

## 2022-12-07 NOTE — Telephone Encounter (Signed)
Spoke with triage nurse Benjamine Mola and she stated that patient needed to be seen in ED but patient refused.  Called and spoke with patient.  She went to ER on 12/04/22 and she does not want to go back.  They stated that she may have a vitamin b deficiency and did MRI of her head which was normal and sent her home.  She is still having intermittent tingling here and there on right side and no other complaints.  I advised that I did not have any openings today with you or anyone else.  She did not want to see anyone else or go to any other [facility].  Appointment made for tomorrow. Is she ok to wait and any recommendations?  She does not want to wait, but advised to go to ER or UC if symptoms worsens.

## 2022-12-07 NOTE — Telephone Encounter (Signed)
Spoke with patient again and she stated that she talked with her sisters husband who is a doctor and he stated that if it was b12 then she would be tingling all over.  I advised her that is going to listen to him or her provider and may best for her to be evaluated by Dr. Etter Sjogren tomorrow.  Patient agreed.  Advised again that if this gets worse to return to ER or go to UC.

## 2022-12-07 NOTE — Telephone Encounter (Signed)
Pt was seen at the ER for right side numbness and it has still not gone away. She wanted to be seen today, transferred to triage.

## 2022-12-08 ENCOUNTER — Encounter: Payer: Self-pay | Admitting: Family Medicine

## 2022-12-08 ENCOUNTER — Ambulatory Visit (HOSPITAL_BASED_OUTPATIENT_CLINIC_OR_DEPARTMENT_OTHER)
Admission: RE | Admit: 2022-12-08 | Discharge: 2022-12-08 | Disposition: A | Discharge status: Home / Self Care | Payer: 59 | Source: Ambulatory Visit | Attending: Family Medicine | Admitting: Family Medicine

## 2022-12-08 ENCOUNTER — Ambulatory Visit: Payer: 59 | Admitting: Family Medicine

## 2022-12-08 VITALS — BP 142/80 | HR 88 | Temp 98.5°F | Resp 18 | Ht 62.5 in | Wt 233.4 lb

## 2022-12-08 DIAGNOSIS — I10 Essential (primary) hypertension: Secondary | ICD-10-CM | POA: Diagnosis not present

## 2022-12-08 DIAGNOSIS — R200 Anesthesia of skin: Secondary | ICD-10-CM | POA: Insufficient documentation

## 2022-12-08 DIAGNOSIS — R202 Paresthesia of skin: Secondary | ICD-10-CM | POA: Diagnosis present

## 2022-12-08 DIAGNOSIS — E538 Deficiency of other specified B group vitamins: Secondary | ICD-10-CM | POA: Diagnosis not present

## 2022-12-08 MED ORDER — CYANOCOBALAMIN 1000 MCG/ML IJ SOLN: INTRAMUSCULAR | 0 refills | Status: DC

## 2022-12-08 MED ORDER — DILTIAZEM HCL ER 180 MG PO TB24: 180.0000 mg | ORAL_TABLET | Freq: Every day | ORAL | 1 refills | Status: DC

## 2022-12-08 MED ORDER — CYANOCOBALAMIN 1000 MCG/ML IJ SOLN
1000.0000 ug | Freq: Once | INTRAMUSCULAR | Status: AC
  Administered 2022-12-08: 1000 ug via INTRAMUSCULAR

## 2022-12-08 NOTE — Progress Notes (Addendum)
Subjective:   By signing my name below, I, Martha Hampton, attest that this documentation has been prepared under the direction and in the presence of Martha Hampton, 12/08/2022.   Patient ID: Martha Hampton, female    DOB: September 06, 1966, 56 y.o.   MRN: [id number]  No chief complaint on file.   HPI Patient is in today for an office visit.  ER Follow Up Patient was admitted to the ER for paresthesia on Friday, 12/15. She reports that on 12/14 she experienced tingling in her right arm, but did not think much of this until the following day. On 12/15, the paresthesia experienced in her right arm traveled up towards her neck and onto face. She had an EKG, CAT scan, and blood work completed that came back normal. The ER was not able to get an MRI so the patient traveled to [facility] for this. The MRI came back normal and she was told her TSH is normal, but her vitamin b12 levels were low which could cause a tingling sensation. She explains that she is currently experiencing numbness and tingling on right side of face, shoulder, and arm.   Left foot pain Patient is complaining of left foot pain and states that it may be due to her fallen arch. She does not currently see a podiatrist.   Health Maintenance Due  Topic Date Due   MAMMOGRAM  04/23/2000   Zoster Vaccines- Shingrix (1 of 2) Never done   PAP SMEAR-Modifier  10/21/2017   DTaP/Tdap/Td (2 - Tdap) 04/16/2019   INFLUENZA VACCINE  Never done    Past Medical History:  Diagnosis Date   Anemia    iron deficiency   Anxiety    Genital herpes    GERD (gastroesophageal reflux disease)    Hyperlipidemia     Past Surgical History:  Procedure Laterality Date   ANKLE SURGERY  1986   left   CHOLECYSTECTOMY  11-2005   COLONOSCOPY  01/06/2013   Procedure: COLONOSCOPY;  Surgeon: Beryle Beams, MD;  Location: [facility];  Service: Gastroenterology;  Laterality: N/A;   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    Family History  Problem Relation Age of  Onset   Hypertension Mother    Cancer Mother        non-dodgkins mantel cell   Hypertension Father    Rectal cancer Maternal Grandmother    Breast cancer Maternal Aunt     Social History   Socioeconomic History   Marital status: Married    Spouse name: Not on file   Number of children: Not on file   Years of education: Not on file   Highest education level: Not on file  Occupational History   Not on file  Tobacco Use   Smoking status: Never   Smokeless tobacco: Never  Substance and Sexual Activity   Alcohol use: Yes    Comment: daily   Drug use: No   Sexual activity: Not on file  Other Topics Concern   Not on file  Social History Narrative   Not on file   Social Determinants of Health   Financial Resource Strain: Not on file  Food Insecurity: Not on file  Transportation Needs: Not on file  Physical Activity: Not on file  Stress: Not on file  Social Connections: Not on file  Intimate Partner Violence: Not on file    Outpatient Medications Prior to Visit  Medication Sig Dispense Refill   acamprosate (CAMPRAL) 333 MG tablet Take 1 tablet (333  mg total) by mouth 3 (three) times daily with meals. 90 tablet 1   albuterol (VENTOLIN HFA) 108 (90 Base) MCG/ACT inhaler Inhale 2 puffs into the lungs every 6 (six) hours as needed. 18 g 0   B Complex Vitamins (B COMPLEX 1 PO) Take by mouth daily.     benzonatate (TESSALON) 100 MG capsule Take 1 capsule (100 mg total) by mouth 3 (three) times daily as needed for cough. 30 capsule 0   cetirizine (ZYRTEC) 10 MG tablet Take 10 mg by mouth daily.     diltiazem (CARDIZEM CD) 120 MG 24 hr capsule Take 1 capsule (120 mg total) by mouth daily. 90 capsule 0   fluticasone (FLONASE) 50 MCG/ACT nasal spray Place 2 sprays into both nostrils daily. 16 g 1   hydrochlorothiazide (HYDRODIURIL) 25 MG tablet TAKE 1 TABLET (25 MG TOTAL) BY MOUTH DAILY. 30 tablet 11   levonorgestrel (MIRENA) 20 MCG/DAY IUD 1 each by Intrauterine route once.      sertraline (ZOLOFT) 50 MG tablet Take 1 tablet (50 mg total) by mouth daily. 30 tablet 3   Facility-Administered Medications Prior to Visit  Medication Dose Route Frequency Provider Last Rate Last Admin   cyanocobalamin ((VITAMIN B-12)) injection 1,000 mcg  1,000 mcg Intramuscular Once Carollee Herter, Leilanie Rauda R, DO        Allergies  Allergen Reactions   Latex Rash   Anesthetics, Amide     Pt has malignant hyperthermia    Penicillins     REACTION: RASH    Review of Systems  Constitutional:  Negative for fever and malaise/fatigue.  HENT:  Negative for congestion.   Eyes:  Negative for blurred vision.  Respiratory:  Negative for cough and shortness of breath.   Cardiovascular:  Negative for chest pain, palpitations and leg swelling.  Gastrointestinal:  Negative for vomiting.  Musculoskeletal:  Negative for back pain.       (+) left foot pain  Skin:  Negative for rash.  Neurological:  Positive for tingling (right side of upper body). Negative for loss of consciousness and headaches.       Objective:    Physical Exam Vitals and nursing note reviewed.  Constitutional:      General: She is not in acute distress.    Appearance: Normal appearance. She is not ill-appearing.  HENT:     Head: Normocephalic and atraumatic.     Right Ear: External ear normal.     Left Ear: External ear normal.  Eyes:     Extraocular Movements: Extraocular movements intact.     Pupils: Pupils are equal, round, and reactive to light.  Cardiovascular:     Rate and Rhythm: Normal rate and regular rhythm.     Heart sounds: Normal heart sounds. No murmur heard.    No gallop.  Pulmonary:     Effort: Pulmonary effort is normal. No respiratory distress.     Breath sounds: Normal breath sounds. No wheezing or rales.  Skin:    General: Skin is warm and dry.  Neurological:     Mental Status: She is alert and oriented to person, place, and time.     Sensory: Sensory deficit present.  Psychiatric:         Judgment: Judgment normal.    There were no vitals taken for this visit. Wt Readings from Last 3 Encounters:  12/04/22 233 lb (105.7 kg)  07/09/22 218 lb (98.9 kg)  07/08/22 218 lb (98.9 kg)       Assessment &  Plan:   Problem List Items Addressed This Visit   None  No orders of the defined types were placed in this encounter.   I, Martha Hampton, personally preformed the services described in this documentation.  All medical record entries made by the scribe were at my direction and in my presence.  I have reviewed the chart and discharge instructions (if applicable) and agree that the record reflects my personal performance and is accurate and complete. 12/08/2022.   I,Verona Buck,acting as a Education administrator for [employer], DO.,have documented all relevant documentation on the behalf of Ann Held, DO,as directed by  Ann Held, DO while in the presence of Ann Held, DO.    Martha Hampton

## 2022-12-11 ENCOUNTER — Other Ambulatory Visit: Payer: Self-pay | Admitting: Family Medicine

## 2022-12-11 DIAGNOSIS — M542 Cervicalgia: Secondary | ICD-10-CM

## 2022-12-16 ENCOUNTER — Telehealth: Payer: Self-pay | Admitting: Family Medicine

## 2022-12-16 ENCOUNTER — Other Ambulatory Visit: Payer: Self-pay | Admitting: Family Medicine

## 2022-12-16 DIAGNOSIS — M4802 Spinal stenosis, cervical region: Secondary | ICD-10-CM

## 2022-12-16 NOTE — Telephone Encounter (Signed)
There are two different orders. Which one did you want?

## 2022-12-16 NOTE — Telephone Encounter (Signed)
Pt called stating that [facility] had stated that there were two orders for her MRI and they were unsure as to which one to do. Verified there were two different orders for the same area to be scanned and advised a note would be sent back to clarify which order to fulfill. Pt acknowledged understanding and stated she would like a call to indicate when this is corrected.

## 2022-12-17 NOTE — Telephone Encounter (Signed)
Pt called. LDVM 

## 2022-12-18 NOTE — Telephone Encounter (Signed)
[employer] stated PA for the MRI with contrast has not been denied but is on hold. They are waiting for the provider to be a peer to peer phone call, they can be reached at [phone] option 3.

## 2023-01-04 ENCOUNTER — Other Ambulatory Visit: Payer: Self-pay | Admitting: Family Medicine

## 2023-01-04 DIAGNOSIS — E538 Deficiency of other specified B group vitamins: Secondary | ICD-10-CM

## 2023-01-04 MED ORDER — CYANOCOBALAMIN 1000 MCG/ML IJ SOLN: 1000.0000 ug | INTRAMUSCULAR | 1 refills | Status: DC

## 2023-01-04 NOTE — Telephone Encounter (Signed)
Patient last b12 checked in hospital and was 39.

## 2023-01-05 ENCOUNTER — Ambulatory Visit
Admission: RE | Admit: 2023-01-05 | Discharge: 2023-01-05 | Disposition: A | Discharge status: Home / Self Care | Payer: 59 | Source: Ambulatory Visit | Attending: Family Medicine | Admitting: Family Medicine

## 2023-01-05 ENCOUNTER — Ambulatory Visit: Payer: 59 | Admitting: Orthopaedic Surgery

## 2023-01-05 DIAGNOSIS — M542 Cervicalgia: Secondary | ICD-10-CM

## 2023-01-07 ENCOUNTER — Other Ambulatory Visit: Payer: Self-pay

## 2023-01-07 DIAGNOSIS — M4802 Spinal stenosis, cervical region: Secondary | ICD-10-CM

## 2023-01-12 ENCOUNTER — Ambulatory Visit: Payer: 59 | Admitting: Orthopaedic Surgery

## 2023-02-08 ENCOUNTER — Other Ambulatory Visit: Payer: Self-pay | Admitting: Family Medicine

## 2023-02-08 ENCOUNTER — Telehealth: Payer: Self-pay | Admitting: Cardiology

## 2023-02-08 ENCOUNTER — Ambulatory Visit: Payer: 59 | Admitting: Neurology

## 2023-02-08 NOTE — Telephone Encounter (Signed)
This patient is wanting to switch providers from Dr. Harriet Masson at [facility] to Dr. Bettina Gavia at [facility], due to location. Please advise.

## 2023-03-18 ENCOUNTER — Telehealth: Payer: Self-pay | Admitting: Family Medicine

## 2023-03-18 DIAGNOSIS — I10 Essential (primary) hypertension: Secondary | ICD-10-CM

## 2023-03-18 MED ORDER — DILTIAZEM HCL ER 180 MG PO TB24: 180.0000 mg | ORAL_TABLET | Freq: Every day | ORAL | 0 refills | Status: DC

## 2023-03-18 NOTE — Telephone Encounter (Signed)
7 day supply sent to [facility] in [facility].

## 2023-03-18 NOTE — Telephone Encounter (Signed)
Patient states she's currently in [facility] and only has 2 pills left of her Diltiazem. Patient states she will be there until Tuesday and really needs her medication refilled. Patient is aware that her new cardiologist will need to refill it, but pt stated her appointment is not until 04/25 and she will need a refill to get her to that appt date.   [facility] [address] [place]  Phone: [phone]

## 2023-03-29 ENCOUNTER — Other Ambulatory Visit: Payer: Self-pay | Admitting: *Deleted

## 2023-03-29 DIAGNOSIS — I10 Essential (primary) hypertension: Secondary | ICD-10-CM

## 2023-03-29 MED ORDER — DILTIAZEM HCL ER 180 MG PO TB24
180.0000 mg | ORAL_TABLET | Freq: Every day | ORAL | 0 refills | Status: DC
Start: 2023-03-29 — End: 2023-04-15

## 2023-04-05 ENCOUNTER — Encounter: Payer: Self-pay | Admitting: *Deleted

## 2023-04-14 NOTE — Progress Notes (Unsigned)
Cardiology Office Note:    Date:  04/15/2023   ID:  Martha Hampton, DOB [birth year], MRN [id number]  PCP:  Zola Button, Grayling Congress, DO  Cardiologist:  Norman Herrlich, MD    Referring MD: Zola Button, Grayling Congress, *    ASSESSMENT:    1. Palpitations   2. Hypertension, unspecified type   3. Mixed hyperlipidemia    PLAN:    In order of problems listed above:  She had done well previously both control of palpitation without any documented significant arrhythmia and hypertension remain on her long-acting Cardizem given her prescription for 1 year She is concerned about the potential of atrial fibrillation and advised a smart watch and self-monitoring particularly affect product if there is a concern on the strip she can send it to [place] through MyChart Continue her statin   Next appointment: 1 year   Medication Adjustments/Labs and Tests Ordered: Current medicines are reviewed at length with the patient today.  Concerns regarding medicines are outlined above.  No orders of the defined types were placed in this encounter.  No orders of the defined types were placed in this encounter.      History of Present Illness:    Martha Hampton is a 57 y.o. female with a hx of hypertension hyperlipidemia asthma and palpitation last seen 06/18/2021.  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  Echocardiogram performed 06/17/2021 showed normal left ventricular size wall thickness EF 60 to 65% with grade 1 diastolic dysfunction.  Right ventricle normal size and function and no valvular abnormality was noted.  Compliance with diet, lifestyle and medications: Yes  She is frustrated she filled a prescription for 1 week for diltiazem with a charge of $50 I explained to her there is a generic drug from in 1980s and I agree she should remain  this preparation While taking diltiazem her blood pressure is controlled she does not have palpitation and feels better Off the drug she had 1 episode with vigorous activity she felt very weak and her heart was racing She is concerned she may have atrial fibrillation advised her to purchase a smart watch.  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 (Patient taking differently: Inhale 2 puffs into the lungs every 6 (six) hours as needed for wheezing or shortness of breath.)   B Complex Vitamins (B COMPLEX 1 PO) Take 5,000 mg by mouth daily.   cetirizine (ZYRTEC) 10 MG tablet Take 10 mg by mouth daily.   Cholecalciferol (VITAMIN D) 125 MCG (5000 UT) CAPS Take 4  capsules by mouth daily.   Iron Combinations (IRON COMPLEX PO) Take 1 tablet by mouth daily.   levonorgestrel (MIRENA) 20 MCG/DAY IUD 1 each by Intrauterine route once.   POTASSIUM CHLORIDE PO Take 1 tablet by mouth daily.   Current Facility-Administered Medications for the 04/15/23 encounter (Office Visit) with Baldo Daub, MD  Medication   cyanocobalamin ((VITAMIN B-12)) injection 1,000 mcg     Allergies:   Latex; Penicillins; Anesthetics, amide; Hydrocodone-acetaminophen; and Lidocaine   Social History   Socioeconomic History    Marital status: Married    Spouse name: Not on file   Number of children: Not on file   Years of education: Not on file   Highest education level: Not on file  Occupational History   Not on file  Tobacco Use   Smoking status: Never   Smokeless tobacco: Never  Substance and Sexual Activity   Alcohol use: Yes    Comment: daily   Drug use: No   Sexual activity: Not on file  Other Topics Concern   Not on file  Social History Narrative   Not on file   Social Determinants of Health   Financial Resource Strain: Not on file  Food Insecurity: Not on file  Transportation Needs: Not on file  Physical Activity: Not on file  Stress: Not on file  Social Connections: Not on file     Family History: The patient's family history includes Breast cancer in her maternal aunt; Cancer in her mother; Hypertension in her father and mother; Rectal cancer in her maternal grandmother. ROS:   Please see the history of present illness.    All other systems reviewed and are negative.  EKGs/Labs/Other Studies Reviewed:    The following studies were reviewed today:  Cardiac Studies & Procedures       ECHOCARDIOGRAM  ECHOCARDIOGRAM COMPLETE 06/17/2021  Narrative ECHOCARDIOGRAM REPORT    Patient Name:   Martha Hampton Date of Exam: 06/17/2021 Medical Rec #:  [id number]        Height:       63.0 in Accession #:    [id number]       Weight:       221.2 lb Date of Birth:  08-Aug-1966        BSA:          2.018 m Patient Age:    54 years         BP:           133/80 mmHg Patient Gender: F                HR:           76 bpm. Exam Location:  [place]  Procedure: 2D Echo, Cardiac Doppler and Color Doppler  Indications:    Palpitations  History:        Patient has no prior history of Echocardiogram examinations.  Sonographer:    Neomia Dear RDCS Referring Phys: [id number] YVONNE R LOWNE CHASE  IMPRESSIONS   1. Left ventricular ejection fraction, by estimation, is 60 to 65%. The left  ventricle has normal function. The left ventricle has no regional wall motion abnormalities. Left ventricular diastolic parameters are consistent with Grade I diastolic dysfunction (impaired relaxation). 2. Right ventricular systolic function is normal. The right ventricular size is normal. 3. The mitral valve is normal in structure. No evidence of mitral valve regurgitation. No evidence of mitral stenosis. 4. The aortic valve is normal in structure. Aortic valve regurgitation  is not visualized. No aortic stenosis is present. 5. The inferior vena cava is normal in size with greater than 50% respiratory variability, suggesting right atrial pressure of 3 mmHg.  FINDINGS Left Ventricle: Left ventricular ejection fraction, by estimation, is 60 to 65%. The left ventricle has normal function. The left ventricle has no regional wall motion abnormalities. The left ventricular internal cavity size was normal in size. There is no left ventricular hypertrophy. Left ventricular diastolic parameters are consistent with Grade I diastolic dysfunction (impaired relaxation).  Right Ventricle: The right ventricular size is normal. No increase in right ventricular wall thickness. Right ventricular systolic function is normal.  Left Atrium: Left atrial size was normal in size.  Right Atrium: Right atrial size was normal in size.  Pericardium: There is no evidence of pericardial effusion.  Mitral Valve: The mitral valve is normal in structure. No evidence of mitral valve regurgitation. No evidence of mitral valve stenosis.  Tricuspid Valve: The tricuspid valve is normal in structure. Tricuspid valve regurgitation is mild . No evidence of tricuspid stenosis.  Aortic Valve: The aortic valve is normal in structure. Aortic valve regurgitation is not visualized. No aortic stenosis is present. Aortic valve mean gradient measures 5.0 mmHg. Aortic valve peak gradient measures 9.1 mmHg. Aortic valve area, by VTI measures  2.24 cm.  Pulmonic Valve: The pulmonic valve was normal in structure. Pulmonic valve regurgitation is not visualized. No evidence of pulmonic stenosis.  Aorta: The aortic root is normal in size and structure.  Venous: The inferior vena cava is normal in size with greater than 50% respiratory variability, suggesting right atrial pressure of 3 mmHg.  IAS/Shunts: No atrial level shunt detected by color flow Doppler.   LEFT VENTRICLE PLAX 2D LVIDd:         4.38 cm     Diastology LVIDs:         2.74 cm     LV e' medial:    8.76 cm/s LV PW:         0.98 cm     LV E/e' medial:  10.1 LV IVS:        1.13 cm     LV e' lateral:   11.30 cm/s LVOT diam:     2.00 cm     LV E/e' lateral: 7.8 LV SV:         73 LV SV Index:   36 LVOT Area:     3.14 cm  LV Volumes (MOD) LV vol d, MOD A2C: 75.4 ml LV vol d, MOD A4C: 89.0 ml LV vol s, MOD A2C: 37.8 ml LV vol s, MOD A4C: 45.2 ml LV SV MOD A2C:     37.6 ml LV SV MOD A4C:     89.0 ml LV SV MOD BP:      41.5 ml  RIGHT VENTRICLE RV Basal diam:  3.62 cm  PULMONARY VEINS RV Mid diam:    2.34 cm  A Reversal Duration: 106.00 msec TAPSE (M-mode): 2.3 cm   A Reversal Velocity: 27.60 cm/s Diastolic Velocity:  43.80 cm/s S/D Velocity:        1.30 Systolic Velocity:   56.70 cm/s  LEFT ATRIUM             Index       RIGHT ATRIUM           Index LA diam:        3.90 cm 1.93 cm/m  RA Area:     15.90 cm LA  Vol [facility]):   53.4 ml 26.46 ml/m RA Volume:   42.80 ml  21.21 ml/m LA Vol (A4C):   37.9 ml 18.78 ml/m LA Biplane Vol: 45.9 ml 22.74 ml/m AORTIC VALVE                    PULMONIC VALVE AV Area (Vmax):    2.23 cm     PV Vmax:       1.12 m/s AV Area (Vmean):   2.35 cm     PV Vmean:      82.100 cm/s AV Area (VTI):     2.24 cm     PV VTI:        0.269 m AV Vmax:           151.00 cm/s  PV Peak grad:  5.0 mmHg AV Vmean:          107.000 cm/s PV Mean grad:  3.0 mmHg AV VTI:            0.325 m AV Peak Grad:      9.1 mmHg AV Mean Grad:      5.0  mmHg LVOT Vmax:         107.00 cm/s LVOT Vmean:        79.900 cm/s LVOT VTI:          0.232 m LVOT/AV VTI ratio: 0.71  AORTA Ao Root diam: 2.70 cm Ao Asc diam:  2.60 cm  MITRAL VALVE MV Area (PHT): 3.93 cm    SHUNTS MV Decel Time: 193 msec    Systemic VTI:  0.23 m MV E velocity: 88.30 cm/s  Systemic Diam: 2.00 cm MV A velocity: 84.90 cm/s MV E/A ratio:  1.04  Belva Crome MD Electronically signed by Belva Crome MD Signature Date/Time: 06/17/2021/6:02:02 PM    Final    MONITORS  LONG TERM MONITOR (3-14 DAYS) 07/27/2021  Narrative Patch Wear Time:  14 days and 0 hours starting June 18, 2021. Indication: Palpitations  Patient had a min HR of 38 bpm, max HR of 193 bpm, and avg HR of 87 bpm. Predominant underlying rhythm was Sinus Rhythm.  430 Supraventricular Tachycardia runs occurred, the run with the fastest interval lasting 11 beats with a max rate of 193 bpm, the longest lasting 17.9 secs with an average rate of 105 bpm.  Second Degree AV Block-Mobitz I (Wenckebach) was present-occurred during the overnight hours (2:24am).  Premature atrial complexes were rare (<1.0%). Premature ventricular complexes were rare (<1.0%.  No ventricular tachycardia, no atrial fibrillation noted.  Conclusion: Paroxysmal symptomatic supraventricular tachycardia.           EKG:  EKG ordered today and personally reviewed.  The ekg ordered today demonstrates  Shows sinus rhythm with 1 APC otherwise normal EKG  Recent Labs: 12/04/2022: ALT 19; BUN 15; Creatinine, Ser 0.82; Hemoglobin 14.2; Platelets 249; Potassium 3.5; Sodium 138 12/05/2022: TSH 2.359  Recent Lipid Panel    Component Value Date/Time   CHOL 180 05/06/2021 1810   TRIG 106.0 05/06/2021 1810   HDL 46.10 05/06/2021 1810   CHOLHDL 4 05/06/2021 1810   VLDL 21.2 05/06/2021 1810   LDLCALC 113 (H) 05/06/2021 1810    Physical Exam:    VS:  BP (!) 140/78 (BP Location: Right Arm, Patient Position: Sitting)   Pulse  87   Ht 5\' 3"  (1.6 m)   Wt 231 lb 12.8 oz (105.1 kg)   SpO2 97%   BMI 41.06 kg/m  Wt Readings from Last 3 Encounters:  04/15/23 231 lb 12.8 oz (105.1 kg)  12/08/22 233 lb 6.4 oz (105.9 kg)  12/04/22 233 lb (105.7 kg)     GEN:  Well nourished, well developed in no acute distress HEENT: Normal NECK: No JVD; No carotid bruits LYMPHATICS: No lymphadenopathy CARDIAC: RRR, no murmurs, rubs, gallops RESPIRATORY:  Clear to auscultation without rales, wheezing or rhonchi  ABDOMEN: Soft, non-tender, non-distended MUSCULOSKELETAL:  No edema; No deformity  SKIN: Warm and dry NEUROLOGIC:  Alert and oriented x 3 PSYCHIATRIC:  Normal affect    Signed, Norman Herrlich, MD  04/15/2023 10:23 AM    [facility]

## 2023-04-15 ENCOUNTER — Telehealth: Payer: Self-pay | Admitting: Cardiology

## 2023-04-15 ENCOUNTER — Encounter: Payer: Self-pay | Admitting: Cardiology

## 2023-04-15 ENCOUNTER — Ambulatory Visit: Payer: 59 | Attending: Cardiology | Admitting: Cardiology

## 2023-04-15 VITALS — BP 140/78 | HR 87 | Ht 63.0 in | Wt 231.8 lb

## 2023-04-15 DIAGNOSIS — I10 Essential (primary) hypertension: Secondary | ICD-10-CM | POA: Diagnosis not present

## 2023-04-15 DIAGNOSIS — E782 Mixed hyperlipidemia: Secondary | ICD-10-CM | POA: Diagnosis not present

## 2023-04-15 DIAGNOSIS — R002 Palpitations: Secondary | ICD-10-CM | POA: Diagnosis not present

## 2023-04-15 MED ORDER — DILTIAZEM HCL ER COATED BEADS 180 MG PO CP24: 180.0000 mg | ORAL_CAPSULE | Freq: Every day | ORAL | 3 refills | Status: DC

## 2023-04-15 NOTE — Patient Instructions (Signed)
Medication Instructions:  Your physician recommends that you continue on your current medications as directed. Please refer to the Current Medication list given to you today.  *If you need a refill on your cardiac medications before your next appointment, please call your pharmacy*   Lab Work: None If you have labs (blood work) drawn today and your tests are completely normal, you will receive your results only by: MyChart Message (if you have MyChart) OR A paper copy in the mail If you have any lab test that is abnormal or we need to change your treatment, we will call you to review the results.   Testing/Procedures: None   Follow-Up: At [facility], you and your health needs are our priority.  As part of our continuing mission to provide you with exceptional heart care, we have created designated Provider Care Teams.  These Care Teams include your primary Cardiologist (physician) and Advanced Practice Providers (APPs -  Physician Assistants and Nurse Practitioners) who all work together to provide you with the care you need, when you need it.  We recommend signing up for the patient portal called "MyChart".  Sign up information is provided on this After Visit Summary.  MyChart is used to connect with patients for Virtual Visits (Telemedicine).  Patients are able to view lab/test results, encounter notes, upcoming appointments, etc.  Non-urgent messages can be sent to your provider as well.   To learn more about what you can do with MyChart, go to [email or web address].    Your next appointment:   1 year(s)  Provider:   Norman Herrlich, MD    Other Instructions Get Apple Watch.  Can send by MyChart

## 2023-04-15 NOTE — Telephone Encounter (Signed)
Patient needs 90 day RX for Cardizem sent to [facility] per insurance. Stated other pharmacies will only fill 30 day rx.

## 2023-04-15 NOTE — Telephone Encounter (Signed)
Patient states that she would only need 4 days to get her through the weekend since she leaves in the morning. Instructed her to take rx that was given to her today at her appointment and ask her pharmacy to only fill the amount of tablets she needs at this time and I will send rx to her mail order [facility]. Patient voices understanding and agreed.   Diltiazem 180 mg # 90 tablets with 3 additional refills sent to [facility]

## 2023-04-22 ENCOUNTER — Ambulatory Visit (HOSPITAL_BASED_OUTPATIENT_CLINIC_OR_DEPARTMENT_OTHER)
Admission: RE | Admit: 2023-04-22 | Discharge: 2023-04-22 | Disposition: A | Discharge status: Home / Self Care | Payer: 59 | Source: Ambulatory Visit | Attending: Family Medicine | Admitting: Family Medicine

## 2023-04-22 ENCOUNTER — Encounter: Payer: Self-pay | Admitting: Family Medicine

## 2023-04-22 ENCOUNTER — Ambulatory Visit: Payer: 59 | Admitting: Family Medicine

## 2023-04-22 VITALS — BP 148/80 | HR 94 | Temp 98.5°F | Resp 18 | Ht 63.0 in | Wt 235.0 lb

## 2023-04-22 DIAGNOSIS — R0981 Nasal congestion: Secondary | ICD-10-CM

## 2023-04-22 HISTORY — DX: Nasal congestion: R09.81

## 2023-04-22 MED ORDER — IOHEXOL 300 MG/ML  SOLN
75.0000 mL | Freq: Once | INTRAMUSCULAR | Status: AC | PRN
  Administered 2023-04-22: 75 mL via INTRAVENOUS

## 2023-04-22 NOTE — Progress Notes (Addendum)
Subjective:   By signing my name below, I, [name], attest that this documentation has been prepared under the direction and in the presence of Donato Schultz, DO. 04/22/2023   Patient ID: Martha Hampton, female    DOB: 04-21-1966, 57 y.o.   MRN: [id number]  Chief Complaint  Patient presents with   Nasal Problem    Pt states sxs have been going on for a while. Pt states having congestion and states neosporin in her nose a few days ago.     HPI Patient is in today for a office visit.   She complains of clear drainage since August 2023. She also has rawness on the inside of his nose. She also has tenderness to the touch on the outside of her nose. She denies congestion. She has taken sudafed to manage her symptoms. She has not taken sudafed today and reports feeling burning in her nose due to dryness.    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    Family History  Problem Relation Age of Onset   Hypertension Mother    Cancer Mother        non-dodgkins mantel cell   Hypertension Father    Rectal cancer Maternal Grandmother    Breast cancer Maternal Aunt     Social History   Socioeconomic History   Marital status: Married    Spouse name: Not on file   Number of children: Not on file   Years of education: Not on file   Highest education level: Some college, no degree  Occupational History   Not on file  Tobacco Use   Smoking status: Never   Smokeless tobacco: Never  Substance and Sexual Activity   Alcohol use: Yes    Comment: daily   Drug use: No   Sexual  activity: Not on file  Other Topics Concern   Not on file  Social History Narrative   Not on file   Social Determinants of Health   Financial Resource Strain: Low Risk  (04/21/2023)   Overall Financial Resource Strain (CARDIA)    Difficulty of Paying Living Expenses: Not hard at all  Food Insecurity: No Food Insecurity (04/21/2023)   Hunger Vital Sign    Worried About Running Out of Food in the Last Year: Never true    Ran Out of Food in the Last Year: Never true  Transportation Needs: No Transportation Needs (04/21/2023)   PRAPARE - Administrator, Civil Service (Medical): No    Lack of Transportation (Non-Medical): No  Physical Activity: Unknown (04/21/2023)   Exercise Vital Sign    Days of Exercise per Week: 0 days    Minutes of Exercise per Session: Not on file  Stress: No Stress Concern Present (04/21/2023)   [employer] of Occupational Health - Occupational Stress Questionnaire    Feeling of Stress : Not at all  Social Connections: Moderately Integrated (04/21/2023)   Social Connection and Isolation Panel [NHANES]    Frequency of Communication with  Friends and Family: More than three times a week    Frequency of Social Gatherings with Friends and Family: More than three times a week    Attends Religious Services: 1 to 4 times per year    Active Member of [employer] or Organizations: No    Attends Engineer, structural: Not on file    Marital Status: Married  Catering manager Violence: Not on file    Outpatient Medications Prior to Visit  Medication Sig Dispense Refill   albuterol (VENTOLIN HFA) 108 (90 Base) MCG/ACT inhaler Inhale 2 puffs into the lungs every 6 (six) hours as needed. (Patient taking differently: Inhale 2 puffs into the lungs every 6 (six) hours as needed for wheezing or shortness of breath.) 18 g 0   B Complex Vitamins (B COMPLEX 1 PO) Take 5,000 mg by mouth daily.     cetirizine (ZYRTEC) 10 MG tablet Take 10 mg by mouth daily.      Cholecalciferol (VITAMIN D) 125 MCG (5000 UT) CAPS Take 4 capsules by mouth daily.     diltiazem (CARDIZEM CD) 180 MG 24 hr capsule Take 1 capsule (180 mg total) by mouth daily. 90 capsule 3   Iron Combinations (IRON COMPLEX PO) Take 1 tablet by mouth daily.     levonorgestrel (MIRENA) 20 MCG/DAY IUD 1 each by Intrauterine route once.     POTASSIUM CHLORIDE PO Take 1 tablet by mouth daily.     Facility-Administered Medications Prior to Visit  Medication Dose Route Frequency Provider Last Rate Last Admin   cyanocobalamin ((VITAMIN B-12)) injection 1,000 mcg  1,000 mcg Intramuscular Once Zola Button, Shaakira Borrero R, DO        Allergies  Allergen Reactions   Latex Rash   Penicillins Rash    REACTION: RASH  Other Reaction(s): Unknown   Anesthetics, Amide     Pt has malignant hyperthermia    Hydrocodone-Acetaminophen     Intolerance to hydrocodone/IBUPROFEN. She can tolerate hydrocodone/acetaminophen   Lidocaine     Other Reaction(s): Not available    Review of Systems  Constitutional:  Negative for chills and fever.  HENT:  Positive for congestion. Negative for sinus pain and sore throat.        (+)nose tenderness (+)clear nasal drainage (+)raw inside nasal canal  Respiratory:  Negative for cough and sputum production.        Objective:    Physical Exam Vitals and nursing note reviewed.  Constitutional:      General: She is not in acute distress.    Appearance: Normal appearance. She is not ill-appearing.  HENT:     Head: Normocephalic and atraumatic.     Right Ear: External ear normal.     Left Ear: External ear normal.     Nose: Congestion present.     Right Turbinates: Swollen.     Left Turbinates: Swollen.     Comments: Erythematous turbinates and swollen Eyes:     Extraocular Movements: Extraocular movements intact.     Pupils: Pupils are equal, round, and reactive to light.  Cardiovascular:     Rate and Rhythm: Normal rate and regular rhythm.     Heart sounds:  Normal heart sounds. No murmur heard.    No gallop.  Pulmonary:     Effort: Pulmonary effort is normal. No respiratory distress.     Breath sounds: Normal breath sounds. No wheezing or rales.  Skin:    General: Skin is warm and dry.  Neurological:     Mental  Status: She is alert and oriented to person, place, and time.  Psychiatric:        Judgment: Judgment normal.     BP (!) 148/80 (BP Location: Left Arm, Patient Position: Sitting, Cuff Size: Large)   Pulse 94   Temp 98.5 F (36.9 C) (Oral)   Resp 18   Ht 5\' 3"  (1.6 m)   Wt 235 lb (106.6 kg)   SpO2 97%   BMI 41.63 kg/m  Wt Readings from Last 3 Encounters:  04/22/23 235 lb (106.6 kg)  04/15/23 231 lb 12.8 oz (105.1 kg)  12/08/22 233 lb 6.4 oz (105.9 kg)       Assessment & Plan:  Chronic nasal congestion Assessment & Plan: With chronic sores in nasal passages  Use saline spray / vaseline Cool mist humidifier  Ct sinuses  Ent referral   Orders: -     CBC with Differential/Platelet -     Comprehensive metabolic panel -     CT MAXILLOFACIAL W CONTRAST; Future -     Ambulatory referral to ENT    I, Donato Schultz, DO, personally preformed the services described in this documentation.  All medical record entries made by the scribe were at my direction and in my presence.  I have reviewed the chart and discharge instructions (if applicable) and agree that the record reflects my personal performance and is accurate and complete. 04/22/2023   I,[name],acting as a scribe for Donato Schultz, DO.,have documented all relevant documentation on the behalf of Donato Schultz, DO,as directed by  Donato Schultz, DO while in the presence of Donato Schultz, DO.   Donato Schultz, DO

## 2023-04-22 NOTE — Assessment & Plan Note (Signed)
With chronic sores in nasal passages  Use saline spray / vaseline Cool mist humidifier  Ct sinuses  Ent referral

## 2023-04-23 ENCOUNTER — Other Ambulatory Visit: Payer: Self-pay | Admitting: Family Medicine

## 2023-04-23 ENCOUNTER — Telehealth (HOSPITAL_BASED_OUTPATIENT_CLINIC_OR_DEPARTMENT_OTHER): Payer: Self-pay

## 2023-04-23 DIAGNOSIS — R748 Abnormal levels of other serum enzymes: Secondary | ICD-10-CM

## 2023-04-23 DIAGNOSIS — F1010 Alcohol abuse, uncomplicated: Secondary | ICD-10-CM

## 2023-04-23 LAB — CBC WITH DIFFERENTIAL/PLATELET
Basophils Absolute: 0 10*3/uL (ref 0.0–0.1)
Basophils Relative: 0.5 % (ref 0.0–3.0)
Eosinophils Absolute: 0.1 10*3/uL (ref 0.0–0.7)
Eosinophils Relative: 1.7 % (ref 0.0–5.0)
HCT: 39.3 % (ref 36.0–46.0)
Hemoglobin: 12.6 g/dL (ref 12.0–15.0)
Lymphocytes Relative: 27.1 % (ref 12.0–46.0)
Lymphs Abs: 1.4 10*3/uL (ref 0.7–4.0)
MCHC: 32.2 g/dL (ref 30.0–36.0)
MCV: 89.1 fl (ref 78.0–100.0)
Monocytes Absolute: 0.5 10*3/uL (ref 0.1–1.0)
Monocytes Relative: 10.7 % (ref 3.0–12.0)
Neutro Abs: 3 10*3/uL (ref 1.4–7.7)
Neutrophils Relative %: 60 % (ref 43.0–77.0)
Platelets: 276 10*3/uL (ref 150.0–400.0)
RBC: 4.4 Mil/uL (ref 3.87–5.11)
RDW: 17.4 % — ABNORMAL HIGH (ref 11.5–15.5)
WBC: 5 10*3/uL (ref 4.0–10.5)

## 2023-04-23 LAB — COMPREHENSIVE METABOLIC PANEL
ALT: 133 U/L — ABNORMAL HIGH (ref 0–35)
AST: 171 U/L — ABNORMAL HIGH (ref 0–37)
Albumin: 4.3 g/dL (ref 3.5–5.2)
Alkaline Phosphatase: 89 U/L (ref 39–117)
BUN: 13 mg/dL (ref 6–23)
CO2: 28 mEq/L (ref 19–32)
Calcium: 9.3 mg/dL (ref 8.4–10.5)
Chloride: 99 mEq/L (ref 96–112)
Creatinine, Ser: 0.73 mg/dL (ref 0.40–1.20)
GFR: 91.83 mL/min (ref >60.00)
Glucose, Bld: 110 mg/dL — ABNORMAL HIGH (ref 70–99)
Potassium: 3.7 mEq/L (ref 3.5–5.1)
Sodium: 140 mEq/L (ref 135–145)
Total Bilirubin: 0.4 mg/dL (ref 0.2–1.2)
Total Protein: 6.9 g/dL (ref 6.0–8.3)

## 2023-08-31 ENCOUNTER — Ambulatory Visit (INDEPENDENT_AMBULATORY_CARE_PROVIDER_SITE_OTHER): Payer: 59 | Admitting: Family Medicine

## 2023-08-31 ENCOUNTER — Encounter: Payer: Self-pay | Admitting: Family Medicine

## 2023-08-31 VITALS — BP 138/88 | HR 87 | Temp 98.5°F | Resp 18 | Ht 63.0 in | Wt 227.0 lb

## 2023-08-31 DIAGNOSIS — K635 Polyp of colon: Secondary | ICD-10-CM

## 2023-08-31 DIAGNOSIS — I10 Essential (primary) hypertension: Secondary | ICD-10-CM

## 2023-08-31 DIAGNOSIS — Z0000 Encounter for general adult medical examination without abnormal findings: Secondary | ICD-10-CM | POA: Diagnosis not present

## 2023-08-31 DIAGNOSIS — R748 Abnormal levels of other serum enzymes: Secondary | ICD-10-CM | POA: Diagnosis not present

## 2023-08-31 DIAGNOSIS — Z23 Encounter for immunization: Secondary | ICD-10-CM | POA: Diagnosis not present

## 2023-08-31 DIAGNOSIS — E782 Mixed hyperlipidemia: Secondary | ICD-10-CM
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
## 2023-09-01 DIAGNOSIS — R748 Abnormal levels of other serum enzymes: Secondary | ICD-10-CM | POA: Insufficient documentation

## 2023-09-01 DIAGNOSIS — Z0000 Encounter for general adult medical examination without abnormal findings: Secondary | ICD-10-CM | POA: Insufficient documentation

## 2023-09-01 DIAGNOSIS — K635 Polyp of colon: Secondary | ICD-10-CM | POA: Insufficient documentation

## 2023-09-01 HISTORY — DX: Polyp of colon: K63.5

## 2023-09-01 HISTORY — DX: Abnormal levels of other serum enzymes: R74.8

## 2023-09-01 HISTORY — DX: Encounter for general adult medical examination without abnormal findings: Z00.00

## 2023-09-01 LAB — CBC WITH DIFFERENTIAL/PLATELET
Basophils Absolute: 0 10*3/uL (ref 0.0–0.1)
Basophils Relative: 0.6 % (ref 0.0–3.0)
Eosinophils Absolute: 0 10*3/uL (ref 0.0–0.7)
Eosinophils Relative: 0.8 % (ref 0.0–5.0)
HCT: 41.4 % (ref 36.0–46.0)
Hemoglobin: 13.1 g/dL (ref 12.0–15.0)
Lymphocytes Relative: 30.3 % (ref 12.0–46.0)
Lymphs Abs: 1.5 10*3/uL (ref 0.7–4.0)
MCHC: 31.7 g/dL (ref 30.0–36.0)
MCV: 88.3 fl (ref 78.0–100.0)
Monocytes Absolute: 0.4 10*3/uL (ref 0.1–1.0)
Monocytes Relative: 8.6 % (ref 3.0–12.0)
Neutro Abs: 3 10*3/uL (ref 1.4–7.7)
Neutrophils Relative %: 59.7 % (ref 43.0–77.0)
Platelets: 241 10*3/uL (ref 150.0–400.0)
RBC: 4.69 Mil/uL (ref 3.87–5.11)
RDW: 16.3 % — ABNORMAL HIGH (ref 11.5–15.5)
WBC: 5 10*3/uL (ref 4.0–10.5)

## 2023-09-01 LAB — HEPATITIS PANEL, ACUTE
Hep A IgM: NONREACTIVE
Hep B C IgM: NONREACTIVE
Hepatitis B Surface Ag: NONREACTIVE
Hepatitis C Ab: NONREACTIVE

## 2023-09-01 LAB — COMPREHENSIVE METABOLIC PANEL
ALT: 59 U/L — ABNORMAL HIGH (ref 0–35)
AST: 61 U/L — ABNORMAL HIGH (ref 0–37)
Albumin: 4.3 g/dL (ref 3.5–5.2)
Alkaline Phosphatase: 88 U/L (ref 39–117)
BUN: 6 mg/dL (ref 6–23)
CO2: 31 meq/L (ref 19–32)
Calcium: 9.5 mg/dL (ref 8.4–10.5)
Chloride: 97 meq/L (ref 96–112)
Creatinine, Ser: 0.69 mg/dL (ref 0.40–1.20)
GFR: 96.66 mL/min (ref >60.00)
Glucose, Bld: 99 mg/dL (ref 70–99)
Potassium: 4 meq/L (ref 3.5–5.1)
Sodium: 138 meq/L (ref 135–145)
Total Bilirubin: 0.8 mg/dL (ref 0.2–1.2)
Total Protein: 6.9 g/dL (ref 6.0–8.3)

## 2023-09-01 LAB — LIPID PANEL
Cholesterol: 209 mg/dL — ABNORMAL HIGH (ref 0–200)
HDL: 68.2 mg/dL (ref >39.00)
LDL Cholesterol: 125 mg/dL — ABNORMAL HIGH (ref 0–99)
NonHDL: 140.55
Total CHOL/HDL Ratio: 3
Triglycerides: 80 mg/dL (ref 0.0–149.0)
VLDL: 16 mg/dL (ref 0.0–40.0)

## 2023-09-01 LAB — GAMMA GT: GGT: 51 U/L (ref 7–51)

## 2023-09-01 LAB — TSH: TSH: 0.84 u[IU]/mL (ref 0.35–5.50)

## 2023-09-01 NOTE — Assessment & Plan Note (Signed)
Well controlled, no changes to meds. Encouraged heart healthy diet such as the DASH diet and exercise as tolerated.  °

## 2023-09-01 NOTE — Assessment & Plan Note (Signed)
Ghm utd Check labs  Health Maintenance  Topic Date Due   MAMMOGRAM  04/23/2000   PAP SMEAR-Modifier  10/21/2017   Colonoscopy  01/06/2023   INFLUENZA VACCINE  03/20/2024 (Originally 07/22/2023)   Zoster Vaccines- Shingrix (2 of 2) 10/26/2023   DTaP/Tdap/Td (3 - Td or Tdap) 08/30/2033   Hepatitis C Screening  Completed   HIV Screening  Completed   HPV VACCINES  Aged Out   COVID-19 Vaccine  Discontinued

## 2023-09-01 NOTE — Assessment & Plan Note (Signed)
Repeat today

## 2023-09-01 NOTE — Patient Instructions (Signed)
Preventive Care 40-57 Years Old, Female Preventive care refers to lifestyle choices and visits with your health care provider that can promote health and wellness. Preventive care visits are also called wellness exams. What can I expect for my preventive care visit? Counseling Your health care provider may ask you questions about your: Medical history, including: Past medical problems. Family medical history. Pregnancy history. Current health, including: Menstrual cycle. Method of birth control. Emotional well-being. Home life and relationship well-being. Sexual activity and sexual health. Lifestyle, including: Alcohol, nicotine or tobacco, and drug use. Access to firearms. Diet, exercise, and sleep habits. Work and work environment. Sunscreen use. Safety issues such as seatbelt and bike helmet use. Physical exam Your health care provider will check your: Height and weight. These may be used to calculate your BMI (body mass index). BMI is a measurement that tells if you are at a healthy weight. Waist circumference. This measures the distance around your waistline. This measurement also tells if you are at a healthy weight and may help predict your risk of certain diseases, such as type 2 diabetes and high blood pressure. Heart rate and blood pressure. Body temperature. Skin for abnormal spots. What immunizations do I need?  Vaccines are usually given at various ages, according to a schedule. Your health care provider will recommend vaccines for you based on your age, medical history, and lifestyle or other factors, such as travel or where you work. What tests do I need? Screening Your health care provider may recommend screening tests for certain conditions. This may include: Lipid and cholesterol levels. Diabetes screening. This is done by checking your blood sugar (glucose) after you have not eaten for a while (fasting). Pelvic exam and Pap test. Hepatitis B test. Hepatitis C  test. HIV (human immunodeficiency virus) test. STI (sexually transmitted infection) testing, if you are at risk. Lung cancer screening. Colorectal cancer screening. Mammogram. Talk with your health care provider about when you should start having regular mammograms. This may depend on whether you have a family history of breast cancer. BRCA-related cancer screening. This may be done if you have a family history of breast, ovarian, tubal, or peritoneal cancers. Bone density scan. This is done to screen for osteoporosis. Talk with your health care provider about your test results, treatment options, and if necessary, the need for more tests. Follow these instructions at home: Eating and drinking  Eat a diet that includes fresh fruits and vegetables, whole grains, lean protein, and low-fat dairy products. Take vitamin and mineral supplements as recommended by your health care provider. Do not drink alcohol if: Your health care provider tells you not to drink. You are pregnant, may be pregnant, or are planning to become pregnant. If you drink alcohol: Limit how much you have to 0-1 drink a day. Know how much alcohol is in your drink. In the [place] one drink equals one 12 oz bottle of beer (355 mL), one 5 oz glass of wine (148 mL), or one 1 oz glass of hard liquor (44 mL). Lifestyle Brush your teeth every morning and night with fluoride toothpaste. Floss one time each day. Exercise for at least 30 minutes 5 or more days each week. Do not use any products that contain nicotine or tobacco. These products include cigarettes, chewing tobacco, and vaping devices, such as e-cigarettes. If you need help quitting, ask your health care provider. Do not use drugs. If you are sexually active, practice safe sex. Use a condom or other form of protection to   prevent STIs. If you do not wish to become pregnant, use a form of birth control. If you plan to become pregnant, see your health care provider for a  prepregnancy visit. Take aspirin only as told by your health care provider. Make sure that you understand how much to take and what form to take. Work with your health care provider to find out whether it is safe and beneficial for you to take aspirin daily. Find healthy ways to manage stress, such as: Meditation, yoga, or listening to music. Journaling. Talking to a trusted person. Spending time with friends and family. Minimize exposure to UV radiation to reduce your risk of skin cancer. Safety Always wear your seat belt while driving or riding in a vehicle. Do not drive: If you have been drinking alcohol. Do not ride with someone who has been drinking. When you are tired or distracted. While texting. If you have been using any mind-altering substances or drugs. Wear a helmet and other protective equipment during sports activities. If you have firearms in your house, make sure you follow all gun safety procedures. Seek help if you have been physically or sexually abused. What's next? Visit your health care provider once a year for an annual wellness visit. Ask your health care provider how often you should have your eyes and teeth checked. Stay up to date on all vaccines. This information is not intended to replace advice given to you by your health care provider. Make sure you discuss any questions you have with your health care provider. Document Revised: 06/04/2021 Document Reviewed: 06/04/2021 Elsevier Patient Education  2024 [employer]  

## 2023-09-01 NOTE — Assessment & Plan Note (Signed)
Encourage heart healthy diet such as MIND or DASH diet, increase exercise, avoid trans fats, simple carbohydrates and processed foods, consider a krill or fish or flaxseed oil cap daily.  °

## 2023-09-03 ENCOUNTER — Other Ambulatory Visit: Payer: Self-pay

## 2023-09-03 DIAGNOSIS — R748 Abnormal levels of other serum enzymes: Secondary | ICD-10-CM

## 2023-09-13 ENCOUNTER — Telehealth: Payer: Self-pay | Admitting: Family Medicine

## 2023-09-13 NOTE — Telephone Encounter (Signed)
Pt called stating that she was concerned if her pain in her side is linked to her lung. Pt stated that she had taken her mother-in-law to the hospital with the same symptoms and it ended up being her lung. Pt was wondering if her [place] that was ordered would be able to check this or if additional imaging needed to be ordered.

## 2023-09-17 ENCOUNTER — Encounter: Payer: Self-pay | Admitting: Family

## 2023-09-17 ENCOUNTER — Ambulatory Visit: Payer: 59 | Admitting: Family

## 2023-09-17 ENCOUNTER — Ambulatory Visit (HOSPITAL_BASED_OUTPATIENT_CLINIC_OR_DEPARTMENT_OTHER)
Admission: RE | Admit: 2023-09-17 | Discharge: 2023-09-17 | Disposition: A | Discharge status: Home / Self Care | Payer: 59 | Source: Ambulatory Visit | Attending: Family Medicine | Admitting: Family Medicine

## 2023-09-17 VITALS — BP 162/78 | HR 96 | Temp 98.2°F | Ht 63.0 in | Wt 222.6 lb

## 2023-09-17 DIAGNOSIS — J209 Acute bronchitis, unspecified: Secondary | ICD-10-CM | POA: Diagnosis not present

## 2023-09-17 DIAGNOSIS — R748 Abnormal levels of other serum enzymes: Secondary | ICD-10-CM | POA: Diagnosis present

## 2023-09-17 DIAGNOSIS — R000 Tachycardia, unspecified: Secondary | ICD-10-CM

## 2023-09-17 DIAGNOSIS — R0602 Shortness of breath: Secondary | ICD-10-CM

## 2023-09-17 LAB — CBC WITH DIFFERENTIAL/PLATELET
Basophils Absolute: 0 10*3/uL (ref 0.0–0.1)
Basophils Relative: 0.2 % (ref 0.0–3.0)
Eosinophils Absolute: 0.1 10*3/uL (ref 0.0–0.7)
Eosinophils Relative: 1.8 % (ref 0.0–5.0)
HCT: 41.5 % (ref 36.0–46.0)
Hemoglobin: 13.1 g/dL (ref 12.0–15.0)
Lymphocytes Relative: 23.5 % (ref 12.0–46.0)
Lymphs Abs: 1.1 10*3/uL (ref 0.7–4.0)
MCHC: 31.5 g/dL (ref 30.0–36.0)
MCV: 88.5 fL (ref 78.0–100.0)
Monocytes Absolute: 0.3 10*3/uL (ref 0.1–1.0)
Monocytes Relative: 7 % (ref 3.0–12.0)
Neutro Abs: 3.1 10*3/uL (ref 1.4–7.7)
Neutrophils Relative %: 67.5 % (ref 43.0–77.0)
Platelets: 214 10*3/uL (ref 150.0–400.0)
RBC: 4.69 Mil/uL (ref 3.87–5.11)
RDW: 16.8 % — ABNORMAL HIGH (ref 11.5–15.5)
WBC: 4.6 10*3/uL (ref 4.0–10.5)

## 2023-09-17 LAB — COMPREHENSIVE METABOLIC PANEL
ALT: 114 U/L — ABNORMAL HIGH (ref 0–35)
AST: 195 U/L — ABNORMAL HIGH (ref 0–37)
Albumin: 4.3 g/dL (ref 3.5–5.2)
Alkaline Phosphatase: 92 U/L (ref 39–117)
BUN: 9 mg/dL (ref 6–23)
CO2: 27 meq/L (ref 19–32)
Calcium: 9 mg/dL (ref 8.4–10.5)
Chloride: 101 meq/L (ref 96–112)
Creatinine, Ser: 0.83 mg/dL (ref 0.40–1.20)
GFR: 78.49 mL/min (ref >60.00)
Glucose, Bld: 212 mg/dL — ABNORMAL HIGH (ref 70–99)
Potassium: 3.3 meq/L — ABNORMAL LOW (ref 3.5–5.1)
Sodium: 140 meq/L (ref 135–145)
Total Bilirubin: 0.4 mg/dL (ref 0.2–1.2)
Total Protein: 7.1 g/dL (ref 6.0–8.3)

## 2023-09-17 LAB — D-DIMER, QUANTITATIVE: D-Dimer, Quant: 0.68 ug{FEU}/mL — ABNORMAL HIGH (ref <0.50)

## 2023-09-17 MED ORDER — BENZONATATE 100 MG PO CAPS: 100.0000 mg | ORAL_CAPSULE | Freq: Three times a day (TID) | ORAL | 0 refills | Status: DC | PRN

## 2023-09-17 MED ORDER — AZITHROMYCIN 250 MG PO TABS: ORAL_TABLET | ORAL | 0 refills | Status: DC
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
## 2023-09-20 NOTE — Telephone Encounter (Signed)
Noted  

## 2023-09-23 LAB — HM COLONOSCOPY

## 2023-09-24 ENCOUNTER — Telehealth (INDEPENDENT_AMBULATORY_CARE_PROVIDER_SITE_OTHER): Payer: 59 | Admitting: Family

## 2023-09-24 ENCOUNTER — Encounter: Payer: Self-pay | Admitting: Family

## 2023-09-24 VITALS — Ht 63.0 in

## 2023-09-24 DIAGNOSIS — J209 Acute bronchitis, unspecified: Secondary | ICD-10-CM | POA: Diagnosis not present

## 2023-09-24 MED ORDER — GUAIFENESIN-CODEINE 100-10 MG/5ML PO SOLN: 5.0000 mL | Freq: Four times a day (QID) | ORAL | 0 refills | Status: DC | PRN

## 2023-09-24 MED ORDER — PREDNISONE 20 MG PO TABS: 20.0000 mg | ORAL_TABLET | Freq: Every day | ORAL | 0 refills | Status: DC

## 2023-09-24 MED ORDER — DOXYCYCLINE HYCLATE 100 MG PO TABS: 100.0000 mg | ORAL_TABLET | Freq: Two times a day (BID) | ORAL | 0 refills | Status: DC
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
## 2023-09-27 ENCOUNTER — Other Ambulatory Visit: Payer: Self-pay | Admitting: Family Medicine

## 2023-09-27 DIAGNOSIS — N289 Disorder of kidney and ureter, unspecified: Secondary | ICD-10-CM

## 2023-09-28 ENCOUNTER — Telehealth: Payer: Self-pay | Admitting: Family Medicine

## 2023-09-28 NOTE — Telephone Encounter (Signed)
Pt called stating that she wanted clarification on the US Abdomen Complete as there is conflicting information as to if she has a lesion on her right kidney or both. After reviewing imaging order, noted the following:  -------------------------------------------------------------------------- -Right Kidney Portion:   "Probable isoechoic lateral interpolar right renal 1.9 cm lesion with possible echogenic peripheral rim."  -Third section in Impression Heading:  "Possible complex interpolar left renal 1.9 cm lesion. This could also be further evaluated with nonemergent outpatient pre and post contrast abdominal MRI." --------------------------------------------------------------------------  Pt would like to have this reviewed to ensure she has the correct information as she is in fear of something being on her left kidney as well. Pt would like a call back with our finds, once available. Routed HP for pt peace of mind.

## 2023-09-29 ENCOUNTER — Other Ambulatory Visit (HOSPITAL_BASED_OUTPATIENT_CLINIC_OR_DEPARTMENT_OTHER): Payer: Self-pay | Admitting: Family Medicine

## 2023-09-29 DIAGNOSIS — Z1231 Encounter for screening mammogram for malignant neoplasm of breast: Secondary | ICD-10-CM

## 2023-09-29 NOTE — Telephone Encounter (Signed)
Spoke with patient. Pt advised it was likely mistake. Pt has scheduled MRI. Called reading room and they will ask for an addendum.

## 2023-09-29 NOTE — Telephone Encounter (Signed)
I reviewed the results it does state there is a possible lesion on both kidneys. I will tell the patient about the MRI but what would be the appropriate response to tell the patient for clarification?

## 2023-10-20 ENCOUNTER — Ambulatory Visit (HOSPITAL_BASED_OUTPATIENT_CLINIC_OR_DEPARTMENT_OTHER)
Admission: RE | Admit: 2023-10-20 | Discharge: 2023-10-20 | Disposition: A | Discharge status: Home / Self Care | Payer: 59 | Source: Ambulatory Visit | Attending: Family Medicine | Admitting: Family Medicine

## 2023-10-20 ENCOUNTER — Encounter (HOSPITAL_BASED_OUTPATIENT_CLINIC_OR_DEPARTMENT_OTHER): Payer: Self-pay

## 2023-10-20 DIAGNOSIS — N289 Disorder of kidney and ureter, unspecified: Secondary | ICD-10-CM | POA: Insufficient documentation

## 2023-10-20 DIAGNOSIS — Z1231 Encounter for screening mammogram for malignant neoplasm of breast: Secondary | ICD-10-CM | POA: Diagnosis present

## 2023-10-20 MED ORDER — GADOBUTROL 1 MMOL/ML IV SOLN
10.0000 mL | Freq: Once | INTRAVENOUS | Status: AC | PRN
  Administered 2023-10-20: 10 mL via INTRAVENOUS

## 2024-02-02 ENCOUNTER — Telehealth: Payer: Self-pay

## 2024-02-02 NOTE — Telephone Encounter (Signed)
Error---to close

## 2024-02-13 ENCOUNTER — Other Ambulatory Visit: Payer: Self-pay | Admitting: Cardiology

## 2024-02-15 ENCOUNTER — Telehealth: Payer: Self-pay | Admitting: Family Medicine

## 2024-02-15 NOTE — Telephone Encounter (Signed)
 Copied from CRM [id number]. Topic: Referral - Status >> Feb 15, 2024  8:57 AM Isabell A wrote: Reason for CRM: Patient is calling to check the status of her referral to a kidney specialist. States she called last week to check on this and still hasn't received a call back.   Patient has not received a referral, call or letter to schedule appointment. Patient is requesting a call back.

## 2024-02-15 NOTE — Telephone Encounter (Signed)
 Looks like patient may have kidney lesion? Okay to place referral?

## 2024-03-24 ENCOUNTER — Other Ambulatory Visit: Payer: Self-pay | Admitting: Cardiology

## 2024-03-27 NOTE — Telephone Encounter (Signed)
 Prescription sent to pharmacy.

## 2024-08-31 ENCOUNTER — Encounter: Admitting: Family Medicine

## 2024-09-07 ENCOUNTER — Encounter: Admitting: Family Medicine

## 2024-09-12 ENCOUNTER — Ambulatory Visit: Admitting: Family Medicine

## 2024-09-12 ENCOUNTER — Encounter: Payer: Self-pay | Admitting: Family Medicine

## 2024-09-12 ENCOUNTER — Other Ambulatory Visit: Payer: Self-pay | Admitting: Family Medicine

## 2024-09-12 VITALS — BP 144/80 | HR 102 | Temp 98.5°F | Resp 18 | Ht 63.0 in | Wt 217.4 lb

## 2024-09-12 DIAGNOSIS — R748 Abnormal levels of other serum enzymes: Secondary | ICD-10-CM

## 2024-09-12 DIAGNOSIS — F418 Other specified anxiety disorders: Secondary | ICD-10-CM

## 2024-09-12 DIAGNOSIS — R002 Palpitations: Secondary | ICD-10-CM | POA: Diagnosis not present

## 2024-09-12 DIAGNOSIS — E782 Mixed hyperlipidemia: Secondary | ICD-10-CM | POA: Diagnosis not present

## 2024-09-12 DIAGNOSIS — Z23 Encounter for immunization: Secondary | ICD-10-CM

## 2024-09-12 DIAGNOSIS — I10 Essential (primary) hypertension: Secondary | ICD-10-CM

## 2024-09-12 DIAGNOSIS — Z6838 Body mass index (BMI) 38.0-38.9, adult: Secondary | ICD-10-CM

## 2024-09-12 DIAGNOSIS — E538 Deficiency of other specified B group vitamins: Secondary | ICD-10-CM | POA: Diagnosis not present

## 2024-09-12 DIAGNOSIS — F429 Obsessive-compulsive disorder, unspecified: Secondary | ICD-10-CM

## 2024-09-12 DIAGNOSIS — Z0000 Encounter for general adult medical examination without abnormal findings: Secondary | ICD-10-CM | POA: Diagnosis not present

## 2024-09-12 DIAGNOSIS — K760 Fatty (change of) liver, not elsewhere classified: Secondary | ICD-10-CM

## 2024-09-12 DIAGNOSIS — F1010 Alcohol abuse, uncomplicated: Secondary | ICD-10-CM

## 2024-09-12 MED ORDER — FLUOXETINE HCL 20 MG PO CAPS
20.0000 mg | ORAL_CAPSULE | Freq: Every day | ORAL | 3 refills | Status: AC
Start: 2024-09-12 — End: ?

## 2024-09-12 MED ORDER — ZEPBOUND 2.5 MG/0.5ML ~~LOC~~ SOAJ
2.5000 mg | SUBCUTANEOUS | 0 refills | Status: DC
Start: 2024-09-12 — End: 2024-09-15

## 2024-09-12 NOTE — Assessment & Plan Note (Signed)
 Check labs  Stop etoh

## 2024-09-12 NOTE — Assessment & Plan Note (Signed)
 Encourage heart healthy diet such as MIND or DASH diet, increase exercise, avoid trans fats, simple carbohydrates and processed foods, consider a krill or fish or flaxseed oil cap daily.

## 2024-09-12 NOTE — Assessment & Plan Note (Signed)
 Prozac  20 mg started

## 2024-09-12 NOTE — Assessment & Plan Note (Signed)
 Ghm utd Check labs  See AVS  Health Maintenance  Topic Date Due   Hepatitis B Vaccines 19-59 Average Risk (1 of 3 - 19+ 3-dose series) Never done   Cervical Cancer Screening (HPV/Pap Cotest)  06/28/2014   Pneumococcal Vaccine: 50+ Years (1 of 1 - PCV) Never done   Zoster Vaccines- Shingrix  (2 of 2) 10/26/2023   Mammogram  10/19/2024   Influenza Vaccine  03/20/2025 (Originally 07/21/2024)   DTaP/Tdap/Td (3 - Td or Tdap) 08/30/2033   Colonoscopy  09/22/2033   Hepatitis C Screening  Completed   HIV Screening  Completed   HPV VACCINES  Aged Out   Meningococcal B Vaccine  Aged Out   COVID-19 Vaccine  Discontinued

## 2024-09-12 NOTE — Patient Instructions (Signed)
 Preventive Care 58-58 Years Old, Female  Preventive care refers to lifestyle choices and visits with your health care provider that can promote health and wellness. Preventive care visits are also called wellness exams.  What can I expect for my preventive care visit?  Counseling  Your health care provider may ask you questions about your:  Medical history, including:  Past medical problems.  Family medical history.  Pregnancy history.  Current health, including:  Menstrual cycle.  Method of birth control.  Emotional well-being.  Home life and relationship well-being.  Sexual activity and sexual health.  Lifestyle, including:  Alcohol, nicotine or tobacco, and drug use.  Access to firearms.  Diet, exercise, and sleep habits.  Work and work Astronomer.  Sunscreen use.  Safety issues such as seatbelt and bike helmet use.  Physical exam  Your health care provider will check your:  Height and weight. These may be used to calculate your BMI (body mass index). BMI is a measurement that tells if you are at a healthy weight.  Waist circumference. This measures the distance around your waistline. This measurement also tells if you are at a healthy weight and may help predict your risk of certain diseases, such as type 2 diabetes and high blood pressure.  Heart rate and blood pressure.  Body temperature.  Skin for abnormal spots.  What immunizations do I need?    Vaccines are usually given at various ages, according to a schedule. Your health care provider will recommend vaccines for you based on your age, medical history, and lifestyle or other factors, such as travel or where you work.  What tests do I need?  Screening  Your health care provider may recommend screening tests for certain conditions. This may include:  Lipid and cholesterol levels.  Diabetes screening. This is done by checking your blood sugar (glucose) after you have not eaten for a while (fasting).  Pelvic exam and Pap test.  Hepatitis B test.  Hepatitis C  test.  HIV (human immunodeficiency virus) test.  STI (sexually transmitted infection) testing, if you are at risk.  Lung cancer screening.  Colorectal cancer screening.  Mammogram. Talk with your health care provider about when you should start having regular mammograms. This may depend on whether you have a family history of breast cancer.  BRCA-related cancer screening. This may be done if you have a family history of breast, ovarian, tubal, or peritoneal cancers.  Bone density scan. This is done to screen for osteoporosis.  Talk with your health care provider about your test results, treatment options, and if necessary, the need for more tests.  Follow these instructions at home:  Eating and drinking    Eat a diet that includes fresh fruits and vegetables, whole grains, lean protein, and low-fat dairy products.  Take vitamin and mineral supplements as recommended by your health care provider.  Do not drink alcohol if:  Your health care provider tells you not to drink.  You are pregnant, may be pregnant, or are planning to become pregnant.  If you drink alcohol:  Limit how much you have to 0-1 drink a day.  Know how much alcohol is in your drink. In the [place] one drink equals one 12 oz bottle of beer (355 mL), one 5 oz glass of wine (148 mL), or one 1 oz glass of hard liquor (44 mL).  Lifestyle  Brush your teeth every morning and night with fluoride toothpaste. Floss one time each day.  Exercise for at least  30 minutes 5 or more days each week.  Do not use any products that contain nicotine or tobacco. These products include cigarettes, chewing tobacco, and vaping devices, such as e-cigarettes. If you need help quitting, ask your health care provider.  Do not use drugs.  If you are sexually active, practice safe sex. Use a condom or other form of protection to prevent STIs.  If you do not wish to become pregnant, use a form of birth control. If you plan to become pregnant, see your health care provider for a  prepregnancy visit.  Take aspirin only as told by your health care provider. Make sure that you understand how much to take and what form to take. Work with your health care provider to find out whether it is safe and beneficial for you to take aspirin daily.  Find healthy ways to manage stress, such as:  Meditation, yoga, or listening to music.  Journaling.  Talking to a trusted person.  Spending time with friends and family.  Minimize exposure to UV radiation to reduce your risk of skin cancer.  Safety  Always wear your seat belt while driving or riding in a vehicle.  Do not drive:  If you have been drinking alcohol. Do not ride with someone who has been drinking.  When you are tired or distracted.  While texting.  If you have been using any mind-altering substances or drugs.  Wear a helmet and other protective equipment during sports activities.  If you have firearms in your house, make sure you follow all gun safety procedures.  Seek help if you have been physically or sexually abused.  What's next?  Visit your health care provider once a year for an annual wellness visit.  Ask your health care provider how often you should have your eyes and teeth checked.  Stay up to date on all vaccines.  This information is not intended to replace advice given to you by your health care provider. Make sure you discuss any questions you have with your health care provider.  Document Revised: 06/04/2021 Document Reviewed: 06/04/2021  Elsevier Patient Education  2024 [employer].

## 2024-09-12 NOTE — Assessment & Plan Note (Signed)
 Refer to psych Zepbound  may help with cravings

## 2024-09-12 NOTE — Progress Notes (Signed)
 Subjective:    Patient ID: Martha Hampton, female    DOB: August 31, 1966, 58 y.o.   MRN: [id number]  Chief Complaint  Patient presents with   Annual Exam    Pt states not fasting.     HPI Patient is in today for cpe.   Discussed the use of AI scribe software for clinical note transcription with the patient, who gave verbal consent to proceed.  History of Present Illness Martha Hampton is a 58 year old female who presents for an annual physical exam.  She is concerned about her alcohol consumption, which has increased since her retirement. She finds herself wanting to drink during the day and has not consumed alcohol for a day and a half prior to this visit. She is worried about her liver function tests, which were elevated in May after drinking the night before. She attributes her drinking habits to stress from her previous marriage ending. Her current husband drinks occasionally but not as much as she does.  She has a history of obsessive-compulsive disorder (OCD) since childhood, which she feels has worsened with her drinking. She was previously on Paxil  for about ten years to help cope when her first husband left, but continued to drink while on it. She has not tried other medications like Prozac  and is interested in resuming Paxil  or trying other treatments to manage her OCD and drinking.  She reports difficulty sleeping without alcohol. If she does not drink before bed, she cannot sleep, but if she does, she wakes up during the night. A friend gave her Ambien, which helped her sleep without alcohol, but she only took half a dose due to concerns about its effects.  She has completed her Pap smear, mammogram, and colonoscopy, although the latter was not fully clear due to improper preparation. She has not visited an eye doctor recently and plans to see her dentist soon.  Her father, who recently turned 70, has undergone multiple spinal surgeries and is experiencing difficulty walking.  There are no other significant changes in her family history. No waking up at 2 AM wide awake, but reports increased nighttime urination.    Past Medical History:  Diagnosis Date   Anemia    iron deficiency   Anxiety    Genital herpes    GERD (gastroesophageal reflux disease)    Hyperlipidemia     Past Surgical History:  Procedure Laterality Date   ANKLE SURGERY  1986   left   CHOLECYSTECTOMY  11-2005   COLONOSCOPY  01/06/2013   Procedure: COLONOSCOPY;  Surgeon: Belvie JONETTA Just, MD;  Location: [facility];  Service: Gastroenterology;  Laterality: N/A;   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    Family History  Problem Relation Age of Onset   Hypertension Mother    Cancer Mother        non-dodgkins mantel cell   Hypertension Father    Rectal cancer Maternal Grandmother    Breast cancer Maternal Aunt     Social History   Socioeconomic History   Marital status: Married    Spouse name: Not on file   Number of children: Not on file   Years of education: Not on file   Highest education level: Some college, no degree  Occupational History   Not on file  Tobacco Use   Smoking status: Never   Smokeless tobacco: Never  Substance and Sexual Activity   Alcohol use: Yes    Comment: daily   Drug use: No  Sexual activity: Not on file  Other Topics Concern   Not on file  Social History Narrative   Not on file   Social Drivers of Health   Financial Resource Strain: Low Risk  (04/21/2023)   Overall Financial Resource Strain (CARDIA)    Difficulty of Paying Living Expenses: Not hard at all  Food Insecurity: No Food Insecurity (04/21/2023)   Hunger Vital Sign    Worried About Running Out of Food in the Last Year: Never true    Ran Out of Food in the Last Year: Never true  Transportation Needs: No Transportation Needs (04/21/2023)   PRAPARE - Administrator, Civil Service (Medical): No    Lack of Transportation (Non-Medical): No  Physical Activity: Unknown (04/21/2023)   Exercise  Vital Sign    Days of Exercise per Week: 0 days    Minutes of Exercise per Session: Not on file  Stress: No Stress Concern Present (04/21/2023)   [employer] of Occupational Health - Occupational Stress Questionnaire    Feeling of Stress : Not at all  Social Connections: Moderately Integrated (04/21/2023)   Social Connection and Isolation Panel    Frequency of Communication with Friends and Family: More than three times a week    Frequency of Social Gatherings with Friends and Family: More than three times a week    Attends Religious Services: 1 to 4 times per year    Active Member of [employer] or Organizations: No    Attends Engineer, structural: Not on file    Marital Status: Married  Catering manager Violence: Not on file    Outpatient Medications Prior to Visit  Medication Sig Dispense Refill   B Complex Vitamins (B COMPLEX 1 PO) Take 5,000 mg by mouth daily.     cetirizine (ZYRTEC) 10 MG tablet Take 10 mg by mouth daily.     Cholecalciferol (VITAMIN D ) 125 MCG (5000 UT) CAPS Take 4 capsules by mouth daily.     diltiazem  (CARDIZEM  CD) 180 MG 24 hr capsule Take 1 capsule (180 mg total) by mouth daily. Patient needs appointment for further refills 30 capsule 0   Iron Combinations (IRON COMPLEX PO) Take 1 tablet by mouth daily.     levonorgestrel (MIRENA) 20 MCG/DAY IUD 1 each by Intrauterine route once.     POTASSIUM CHLORIDE  PO Take 1 tablet by mouth daily.     albuterol  (VENTOLIN  HFA) 108 (90 Base) MCG/ACT inhaler Inhale 2 puffs into the lungs every 6 (six) hours as needed. (Patient taking differently: Inhale 2 puffs into the lungs every 6 (six) hours as needed for wheezing or shortness of breath.) 18 g 0   benzonatate  (TESSALON  PERLES) 100 MG capsule Take 1 capsule (100 mg total) by mouth 3 (three) times daily as needed for cough. 20 capsule 0   doxycycline  (VIBRA -TABS) 100 MG tablet Take 1 tablet (100 mg total) by mouth 2 (two) times daily. 14 tablet 0   guaiFENesin -codeine   100-10 MG/5ML syrup Take 5 mLs by mouth every 6 (six) hours as needed for cough. 120 mL 0   predniSONE  (DELTASONE ) 20 MG tablet Take 1 tablet (20 mg total) by mouth daily with breakfast. 5 tablet 0   Facility-Administered Medications Prior to Visit  Medication Dose Route Frequency Provider Last Rate Last Admin   cyanocobalamin  ((VITAMIN B-12)) injection 1,000 mcg  1,000 mcg Intramuscular Once Lowne Chase, Brinda Focht R, DO        Allergies  Allergen Reactions   Latex Rash  Penicillins Rash    REACTION: RASH  Other Reaction(s): Unknown   Anesthetics, Amide     Pt has malignant hyperthermia    Hydrocodone -Acetaminophen      Intolerance to hydrocodone /IBUPROFEN. She can tolerate hydrocodone /acetaminophen    Lidocaine     Other Reaction(s): Not available    Review of Systems  Constitutional:  Negative for chills, fever and malaise/fatigue.  HENT:  Negative for congestion and hearing loss.   Eyes:  Negative for blurred vision and discharge.  Respiratory:  Negative for cough, sputum production and shortness of breath.   Cardiovascular:  Negative for chest pain, palpitations and leg swelling.  Gastrointestinal:  Negative for abdominal pain, blood in stool, constipation, diarrhea, heartburn, nausea and vomiting.  Genitourinary:  Negative for dysuria, frequency, hematuria and urgency.  Musculoskeletal:  Negative for back pain, falls and myalgias.  Skin:  Negative for rash.  Neurological:  Negative for dizziness, sensory change, loss of consciousness, weakness and headaches.  Endo/Heme/Allergies:  Negative for environmental allergies. Does not bruise/bleed easily.  Psychiatric/Behavioral:  Negative for depression and suicidal ideas. The patient is not nervous/anxious and does not have insomnia.        Objective:    Physical Exam Vitals and nursing note reviewed.  Constitutional:      General: She is not in acute distress.    Appearance: Normal appearance. She is well-developed.  HENT:      Head: Normocephalic and atraumatic.     Right Ear: Tympanic membrane, ear canal and external ear normal. There is no impacted cerumen.     Left Ear: Tympanic membrane, ear canal and external ear normal. There is no impacted cerumen.     Nose: Nose normal.     Mouth/Throat:     Mouth: Mucous membranes are moist.     Pharynx: Oropharynx is clear. No oropharyngeal exudate or posterior oropharyngeal erythema.  Eyes:     General: No scleral icterus.       Right eye: No discharge.        Left eye: No discharge.     Conjunctiva/sclera: Conjunctivae normal.     Pupils: Pupils are equal, round, and reactive to light.  Neck:     Thyroid : No thyromegaly or thyroid  tenderness.     Vascular: No JVD.  Cardiovascular:     Rate and Rhythm: Normal rate and regular rhythm.     Heart sounds: Normal heart sounds. No murmur heard. Pulmonary:     Effort: Pulmonary effort is normal. No respiratory distress.     Breath sounds: Normal breath sounds.  Abdominal:     General: Bowel sounds are normal. There is no distension.     Palpations: Abdomen is soft. There is no mass.     Tenderness: There is no abdominal tenderness. There is no guarding or rebound.  Genitourinary:    Vagina: Normal.  Musculoskeletal:        General: Normal range of motion.     Cervical back: Normal range of motion and neck supple.     Right lower leg: No edema.     Left lower leg: No edema.  Lymphadenopathy:     Cervical: No cervical adenopathy.  Skin:    General: Skin is warm and dry.     Findings: No erythema or rash.  Neurological:     Mental Status: She is alert and oriented to person, place, and time.     Cranial Nerves: No cranial nerve deficit.     Deep Tendon Reflexes: Reflexes are normal  and symmetric.  Psychiatric:        Mood and Affect: Mood normal.        Behavior: Behavior normal.        Thought Content: Thought content normal.        Judgment: Judgment normal.     BP (!) 144/80 (BP Location: Right  Arm, Patient Position: Sitting, Cuff Size: Large)   Pulse (!) 102   Temp 98.5 F (36.9 C) (Oral)   Resp 18   Ht 5' 3 (1.6 m)   Wt 217 lb 6.4 oz (98.6 kg)   SpO2 98%   BMI 38.51 kg/m  Wt Readings from Last 3 Encounters:  09/12/24 217 lb 6.4 oz (98.6 kg)  09/17/23 222 lb 9.6 oz (101 kg)  08/31/23 227 lb (103 kg)    Diabetic Foot Exam - Simple   No data filed    Lab Results  Component Value Date   WBC 4.6 09/17/2023   HGB 13.1 09/17/2023   HCT 41.5 09/17/2023   PLT 214.0 09/17/2023   GLUCOSE 212 (H) 09/17/2023   CHOL 209 (H) 08/31/2023   TRIG 80.0 08/31/2023   HDL 68.20 08/31/2023   LDLCALC 125 (H) 08/31/2023   ALT 114 (H) 09/17/2023   AST 195 (H) 09/17/2023   NA 140 09/17/2023   K 3.3 (L) 09/17/2023   CL 101 09/17/2023   CREATININE 0.83 09/17/2023   BUN 9 09/17/2023   CO2 27 09/17/2023   TSH 0.84 08/31/2023   INR 1.0 12/04/2022   HGBA1C 5.8 11/18/2015    Lab Results  Component Value Date   TSH 0.84 08/31/2023   Lab Results  Component Value Date   WBC 4.6 09/17/2023   HGB 13.1 09/17/2023   HCT 41.5 09/17/2023   MCV 88.5 09/17/2023   PLT 214.0 09/17/2023   Lab Results  Component Value Date   NA 140 09/17/2023   K 3.3 (L) 09/17/2023   CO2 27 09/17/2023   GLUCOSE 212 (H) 09/17/2023   BUN 9 09/17/2023   CREATININE 0.83 09/17/2023   BILITOT 0.4 09/17/2023   ALKPHOS 92 09/17/2023   AST 195 (H) 09/17/2023   ALT 114 (H) 09/17/2023   PROT 7.1 09/17/2023   ALBUMIN 4.3 09/17/2023   CALCIUM  9.0 09/17/2023   ANIONGAP 9 12/04/2022   GFR 78.49 09/17/2023   Lab Results  Component Value Date   CHOL 209 (H) 08/31/2023   Lab Results  Component Value Date   HDL 68.20 08/31/2023   Lab Results  Component Value Date   LDLCALC 125 (H) 08/31/2023   Lab Results  Component Value Date   TRIG 80.0 08/31/2023   Lab Results  Component Value Date   CHOLHDL 3 08/31/2023   Lab Results  Component Value Date   HGBA1C 5.8 11/18/2015       Assessment &  Plan:  Preventative health care Assessment & Plan: Ghm utd Check labs  See AVS  Health Maintenance  Topic Date Due   Hepatitis B Vaccines 19-59 Average Risk (1 of 3 - 19+ 3-dose series) Never done   Cervical Cancer Screening (HPV/Pap Cotest)  06/28/2014   Pneumococcal Vaccine: 50+ Years (1 of 1 - PCV) Never done   Zoster Vaccines- Shingrix  (2 of 2) 10/26/2023   Mammogram  10/19/2024   Influenza Vaccine  03/20/2025 (Originally 07/21/2024)   DTaP/Tdap/Td (3 - Td or Tdap) 08/30/2033   Colonoscopy  09/22/2033   Hepatitis C Screening  Completed   HIV Screening  Completed  HPV VACCINES  Aged Out   Meningococcal B Vaccine  Aged Out   COVID-19 Vaccine  Discontinued     Orders: -     CBC with Differential/Platelet -     Comprehensive metabolic panel with GFR -     Lipid panel -     TSH  Elevated liver enzymes Assessment & Plan: Check labs  Stop etoh    Hypertension, unspecified type Assessment & Plan: Well controlled, no changes to meds. Encouraged heart healthy diet such as the DASH diet and exercise as tolerated.    Orders: -     CBC with Differential/Platelet -     Comprehensive metabolic panel with GFR  Mixed hyperlipidemia Assessment & Plan: Encourage heart healthy diet such as MIND or DASH diet, increase exercise, avoid trans fats, simple carbohydrates and processed foods, consider a krill or fish or flaxseed oil cap daily.     Orders: -     Comprehensive metabolic panel with GFR -     Lipid panel  Alcohol abuse Assessment & Plan: Refer to psych Zepbound  may help with cravings   Orders: -     Ambulatory referral to Psychiatry  B12 deficiency -     CBC with Differential/Platelet -     Vitamin B12  Obsessive-compulsive disorder, unspecified type -     Ambulatory referral to Psychiatry -     FLUoxetine  HCl; Take 1 capsule (20 mg total) by mouth daily.  Dispense: 90 capsule; Refill: 3  Fatty liver -     Zepbound ; Inject 2.5 mg into the skin once a week.   Dispense: 0.5 mL; Refill: 0  Morbid obesity (HCC) -     Hemoglobin A1c -     Insulin , random -     Vitamin B12 -     VITAMIN D  25 Hydroxy (Vit-D Deficiency, Fractures)  Need for shingles vaccine -     Varicella-zoster vaccine IM  Palpitations -     Ambulatory referral to Cardiology  Depression with anxiety Assessment & Plan: Prozac  20 mg started    Assessment and Plan Assessment & Plan Alcohol use disorder with insomnia and obsessive-compulsive disorder   She has chronic alcohol use disorder with associated insomnia and obsessive-compulsive disorder. Alcohol consumption has increased since retirement, and she struggles to sleep without it. A previous trial of Paxil  was ineffective for alcohol cravings. Prescribe Zepbound  for alcohol cravings and weight loss. Refer to cognitive behavioral therapy for OCD and alcohol use disorder. Discuss behavioral health urgent care option in [place].  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 Prescribe Zepbound  for weight loss and alcohol cravings. Order blood work to monitor liver function.  Adult Wellness Visit   This is a routine adult wellness visit. There is concern about insurance implications if not considered a physical. Ensure the visit is documented as a physical for insurance purposes.    Matias Thurman R Lowne Chase, DO

## 2024-09-12 NOTE — Assessment & Plan Note (Signed)
 Well controlled, no changes to meds. Encouraged heart healthy diet such as the DASH diet and exercise as tolerated.

## 2024-09-13 ENCOUNTER — Encounter: Payer: Self-pay | Admitting: Family Medicine

## 2024-09-13 ENCOUNTER — Other Ambulatory Visit (HOSPITAL_COMMUNITY): Payer: Self-pay

## 2024-09-13 ENCOUNTER — Telehealth: Payer: Self-pay

## 2024-09-13 LAB — LIPID PANEL
Cholesterol: 248 mg/dL — ABNORMAL HIGH (ref 0–200)
HDL: 64.6 mg/dL (ref >39.00)
LDL Cholesterol: 149 mg/dL — ABNORMAL HIGH (ref 0–99)
NonHDL: 183.69
Total CHOL/HDL Ratio: 4
Triglycerides: 173 mg/dL — ABNORMAL HIGH (ref 0.0–149.0)
VLDL: 34.6 mg/dL (ref 0.0–40.0)

## 2024-09-13 LAB — CBC WITH DIFFERENTIAL/PLATELET
Basophils Absolute: 0 K/uL (ref 0.0–0.1)
Basophils Relative: 0.6 % (ref 0.0–3.0)
Eosinophils Absolute: 0 K/uL (ref 0.0–0.7)
Eosinophils Relative: 0.5 % (ref 0.0–5.0)
HCT: 47.8 % — ABNORMAL HIGH (ref 36.0–46.0)
Hemoglobin: 16 g/dL — ABNORMAL HIGH (ref 12.0–15.0)
Lymphocytes Relative: 20.2 % (ref 12.0–46.0)
Lymphs Abs: 1.3 K/uL (ref 0.7–4.0)
MCHC: 33.4 g/dL (ref 30.0–36.0)
MCV: 97.2 fl (ref 78.0–100.0)
Monocytes Absolute: 0.3 K/uL (ref 0.1–1.0)
Monocytes Relative: 5.3 % (ref 3.0–12.0)
Neutro Abs: 4.6 K/uL (ref 1.4–7.7)
Neutrophils Relative %: 73.4 % (ref 43.0–77.0)
Platelets: 244 K/uL (ref 150.0–400.0)
RBC: 4.92 Mil/uL (ref 3.87–5.11)
RDW: 15.7 % — ABNORMAL HIGH (ref 11.5–15.5)
WBC: 6.2 K/uL (ref 4.0–10.5)

## 2024-09-13 LAB — VITAMIN B12: Vitamin B-12: 195 pg/mL — ABNORMAL LOW (ref 211–911)

## 2024-09-13 LAB — COMPREHENSIVE METABOLIC PANEL WITH GFR
ALT: 18 U/L (ref 0–35)
AST: 27 U/L (ref 0–37)
Albumin: 4.8 g/dL (ref 3.5–5.2)
Alkaline Phosphatase: 105 U/L (ref 39–117)
BUN: 8 mg/dL (ref 6–23)
CO2: 28 meq/L (ref 19–32)
Calcium: 10 mg/dL (ref 8.4–10.5)
Chloride: 97 meq/L (ref 96–112)
Creatinine, Ser: 0.85 mg/dL (ref 0.40–1.20)
GFR: 75.75 mL/min (ref >60.00)
Glucose, Bld: 137 mg/dL — ABNORMAL HIGH (ref 70–99)
Potassium: 3.8 meq/L (ref 3.5–5.1)
Sodium: 137 meq/L (ref 135–145)
Total Bilirubin: 0.7 mg/dL (ref 0.2–1.2)
Total Protein: 7.3 g/dL (ref 6.0–8.3)

## 2024-09-13 LAB — HEMOGLOBIN A1C: Hgb A1c MFr Bld: 5.8 % (ref 4.6–6.5)

## 2024-09-13 LAB — TSH: TSH: 1.21 u[IU]/mL (ref 0.35–5.50)

## 2024-09-13 LAB — VITAMIN D 25 HYDROXY (VIT D DEFICIENCY, FRACTURES): VITD: 94.95 ng/mL (ref 30.00–100.00)

## 2024-09-13 LAB — INSULIN, RANDOM: Insulin: 33.3 u[IU]/mL — ABNORMAL HIGH

## 2024-09-13 NOTE — Telephone Encounter (Signed)
 FYI. Plan exclusion.

## 2024-09-13 NOTE — Telephone Encounter (Signed)
 Pharmacy Patient Advocate Encounter   Received notification from RX Request Messages that prior authorization for Zepbound  2.5mg /0.25ml is required/requested.   Insurance verification completed.   The patient is insured through [facility] .   Per test claim: Per test claim, medication is not covered due to plan/benefit exclusion, PA not submitted at this time

## 2024-09-14 ENCOUNTER — Telehealth: Payer: Self-pay

## 2024-09-14 NOTE — Progress Notes (Signed)
 Cardiology Office Note   Date:  09/19/2024  ID:  JANAYE Hampton, DOB [birth year], MRN [id number] PCP: Antonio Meth, Jamee SAUNDERS, DO  [facility] Providers Cardiologist:  Redell Leiter, MD     History of Present Illness Martha Hampton is a 58 y.o. female with a past medical history of hypertension, GERD, history of alcohol abuse, ADD, dyslipidemia, palpitations  06/18/2021 monitor average heart rate 87 bpm, predominant rhythm was sinus, 430 episodes of SVT, second-degree type I AV block noted during sleeping hours >> amlodipine  was stopped and she was started on Cardizem  06/17/2021 echo EF 60-65%, grade 1 DD, no valvular abnormalities  She established with [facility] in 2022 for the evaluation of palpitations.  A monitor was arranged revealing a high burden of SVT, amlodipine  was stopped and she was started on Cardizem .  An echocardiogram was arranged which was largely normal.  Most recently she was evaluated by Dr. Leiter on 04/15/2023, she was overall well palpitations were quiescent, encouraged to get a smart watch and advised she could follow-up in a year.  She presents today with concerns of elevated blood pressure readings as well as an increased amount of palpitations.  She is also questioning whether she should be on lipid-lowering medication. She denies chest pain, dyspnea, pnd, orthopnea, n, v, dizziness, syncope, edema, weight gain, or early satiety.   ROS: Review of Systems  Cardiovascular:  Positive for palpitations.  Musculoskeletal:  Positive for joint pain.  All other systems reviewed and are negative.    Studies Reviewed EKG Interpretation Date/Time:  Friday September 15 2024 14:30:06 EDT Ventricular Rate:  81 PR Interval:  150 QRS Duration:  76 QT Interval:  378 QTC Calculation: 439 R Axis:   66  Text Interpretation: Normal sinus rhythm Normal ECG When compared with ECG of 04-Dec-2022 18:05, PREVIOUS ECG IS PRESENT Confirmed by Carlin Nest [phone]) on  09/15/2024 2:49:20 PM    Cardiac Studies & Procedures   ______________________________________________________________________________________________     ECHOCARDIOGRAM  ECHOCARDIOGRAM COMPLETE 06/17/2021  Narrative ECHOCARDIOGRAM REPORT    Patient Name:   Martha Hampton Date of Exam: 06/17/2021 Medical Rec #:  [id number]        Height:       63.0 in Accession #:    [id number]       Weight:       221.2 lb Date of Birth:  [birth year]        BSA:          2.018 m Patient Age:    54 years         BP:           133/80 mmHg Patient Gender: F                HR:           76 bpm. Exam Location:  [place]  Procedure: 2D Echo, Cardiac Doppler and Color Doppler  Indications:    Palpitations  History:        Patient has no prior history of Echocardiogram examinations.  Sonographer:    EMMIE DEW RDCS Referring Phys: [id number] YVONNE R LOWNE CHASE  IMPRESSIONS   1. Left ventricular ejection fraction, by estimation, is 60 to 65%. The left ventricle has normal function. The left ventricle has no regional wall motion abnormalities. Left ventricular diastolic parameters are consistent with Grade I diastolic dysfunction (impaired relaxation). 2. Right ventricular systolic function is normal. The right ventricular size is normal. 3. The mitral valve  is normal in structure. No evidence of mitral valve regurgitation. No evidence of mitral stenosis. 4. The aortic valve is normal in structure. Aortic valve regurgitation is not visualized. No aortic stenosis is present. 5. The inferior vena cava is normal in size with greater than 50% respiratory variability, suggesting right atrial pressure of 3 mmHg.  FINDINGS Left Ventricle: Left ventricular ejection fraction, by estimation, is 60 to 65%. The left ventricle has normal function. The left ventricle has no regional wall motion abnormalities. The left ventricular internal cavity size was normal in size. There is no left ventricular hypertrophy.  Left ventricular diastolic parameters are consistent with Grade I diastolic dysfunction (impaired relaxation).  Right Ventricle: The right ventricular size is normal. No increase in right ventricular wall thickness. Right ventricular systolic function is normal.  Left Atrium: Left atrial size was normal in size.  Right Atrium: Right atrial size was normal in size.  Pericardium: There is no evidence of pericardial effusion.  Mitral Valve: The mitral valve is normal in structure. No evidence of mitral valve regurgitation. No evidence of mitral valve stenosis.  Tricuspid Valve: The tricuspid valve is normal in structure. Tricuspid valve regurgitation is mild . No evidence of tricuspid stenosis.  Aortic Valve: The aortic valve is normal in structure. Aortic valve regurgitation is not visualized. No aortic stenosis is present. Aortic valve mean gradient measures 5.0 mmHg. Aortic valve peak gradient measures 9.1 mmHg. Aortic valve area, by VTI measures 2.24 cm.  Pulmonic Valve: The pulmonic valve was normal in structure. Pulmonic valve regurgitation is not visualized. No evidence of pulmonic stenosis.  Aorta: The aortic root is normal in size and structure.  Venous: The inferior vena cava is normal in size with greater than 50% respiratory variability, suggesting right atrial pressure of 3 mmHg.  IAS/Shunts: No atrial level shunt detected by color flow Doppler.   LEFT VENTRICLE PLAX 2D LVIDd:         4.38 cm     Diastology LVIDs:         2.74 cm     LV e' medial:    8.76 cm/s LV PW:         0.98 cm     LV E/e' medial:  10.1 LV IVS:        1.13 cm     LV e' lateral:   11.30 cm/s LVOT diam:     2.00 cm     LV E/e' lateral: 7.8 LV SV:         73 LV SV Index:   36 LVOT Area:     3.14 cm  LV Volumes (MOD) LV vol d, MOD A2C: 75.4 ml LV vol d, MOD A4C: 89.0 ml LV vol s, MOD A2C: 37.8 ml LV vol s, MOD A4C: 45.2 ml LV SV MOD A2C:     37.6 ml LV SV MOD A4C:     89.0 ml LV SV MOD BP:       41.5 ml  RIGHT VENTRICLE RV Basal diam:  3.62 cm  PULMONARY VEINS RV Mid diam:    2.34 cm  A Reversal Duration: 106.00 msec TAPSE (M-mode): 2.3 cm   A Reversal Velocity: 27.60 cm/s Diastolic Velocity:  43.80 cm/s S/D Velocity:        1.30 Systolic Velocity:   56.70 cm/s  LEFT ATRIUM             Index       RIGHT ATRIUM  Index LA diam:        3.90 cm 1.93 cm/m  RA Area:     15.90 cm LA Vol (A2C):   53.4 ml 26.46 ml/m RA Volume:   42.80 ml  21.21 ml/m LA Vol (A4C):   37.9 ml 18.78 ml/m LA Biplane Vol: 45.9 ml 22.74 ml/m AORTIC VALVE                    PULMONIC VALVE AV Area (Vmax):    2.23 cm     PV Vmax:       1.12 m/s AV Area (Vmean):   2.35 cm     PV Vmean:      82.100 cm/s AV Area (VTI):     2.24 cm     PV VTI:        0.269 m AV Vmax:           151.00 cm/s  PV Peak grad:  5.0 mmHg AV Vmean:          107.000 cm/s PV Mean grad:  3.0 mmHg AV VTI:            0.325 m AV Peak Grad:      9.1 mmHg AV Mean Grad:      5.0 mmHg LVOT Vmax:         107.00 cm/s LVOT Vmean:        79.900 cm/s LVOT VTI:          0.232 m LVOT/AV VTI ratio: 0.71  AORTA Ao Root diam: 2.70 cm Ao Asc diam:  2.60 cm  MITRAL VALVE MV Area (PHT): 3.93 cm    SHUNTS MV Decel Time: 193 msec    Systemic VTI:  0.23 m MV E velocity: 88.30 cm/s  Systemic Diam: 2.00 cm MV A velocity: 84.90 cm/s MV E/A ratio:  1.04  Piers Baade Crape MD Electronically signed by Ryliegh Mcduffey Crape MD Signature Date/Time: 06/17/2021/6:02:02 PM    Final    MONITORS  LONG TERM MONITOR (3-14 DAYS) 07/14/2021  Narrative Patch Wear Time:  14 days and 0 hours starting June 18, 2021. Indication: Palpitations  Patient had a min HR of 38 bpm, max HR of 193 bpm, and avg HR of 87 bpm. Predominant underlying rhythm was Sinus Rhythm.  430 Supraventricular Tachycardia runs occurred, the run with the fastest interval lasting 11 beats with a max rate of 193 bpm, the longest lasting 17.9 secs with an average rate of 105  bpm.  Second Degree AV Block-Mobitz I (Wenckebach) was present-occurred during the overnight hours (2:24am).  Premature atrial complexes were rare (<1.0%). Premature ventricular complexes were rare (<1.0%.  No ventricular tachycardia, no atrial fibrillation noted.  Conclusion: Paroxysmal symptomatic supraventricular tachycardia.       ______________________________________________________________________________________________      Risk Assessment/Calculations        Physical Exam VS:  BP (!) 150/82   Pulse 81   Ht 5' 3 (1.6 m)   Wt 216 lb (98 kg)   SpO2 95%   BMI 38.26 kg/m        Wt Readings from Last 3 Encounters:  09/15/24 216 lb (98 kg)  09/12/24 217 lb 6.4 oz (98.6 kg)  09/17/23 222 lb 9.6 oz (101 kg)    GEN: Well nourished, well developed in no acute distress NECK: No JVD; No carotid bruits CARDIAC: RRR, no murmurs, rubs, gallops RESPIRATORY:  Clear to auscultation without rales, wheezing or rhonchi  ABDOMEN: Soft, non-tender, non-distended EXTREMITIES:  No edema;  No deformity   ASSESSMENT AND PLAN Palpitations-previous monitor in 2022 revealed high burden of SVT, will repeat monitor to see if this is increased. Increase her Cardizem  to 240 mg daily.  Hypertension-blood pressure is elevated in the office today, increase Cardizem  to 240 mg daily.  Dyslipidemia- start Crestor  20 mg daily, 09/12/24 total 248, LDL 149, LFTs normal. Repeat FLP, LFT, LP(a) in 6 weeks.        Dispo: increase cardizem  to 240 mg daily, start Crestor  20 mg daily, repeat FLP, LFTs and LP(a) in 6 weeks. Monitor for palpitations. 6 mos with Krasowski.   Signed, Delon JAYSON Hoover, NP

## 2024-09-14 NOTE — Telephone Encounter (Signed)
 Copied from CRM [id number]. Topic: General - Other >> Sep 14, 2024  2:08 PM Timindy P wrote: Reason for CRM: Patient wanted to inform office that she just faxed over a 2025 my chart physician summary report that is 3 pages. She is stating there isn't any patient information as far as her name or OB listed but that information needs to be passed along to Dr. Antonio. Pt stating forms were sent to [phone].

## 2024-09-15 ENCOUNTER — Encounter: Payer: Self-pay | Admitting: Cardiology

## 2024-09-15 ENCOUNTER — Ambulatory Visit: Attending: Cardiology

## 2024-09-15 ENCOUNTER — Ambulatory Visit: Attending: Cardiology | Admitting: Cardiology

## 2024-09-15 VITALS — BP 150/82 | HR 81 | Ht 63.0 in | Wt 216.0 lb

## 2024-09-15 DIAGNOSIS — R002 Palpitations: Secondary | ICD-10-CM | POA: Diagnosis not present

## 2024-09-15 DIAGNOSIS — I10 Essential (primary) hypertension: Secondary | ICD-10-CM

## 2024-09-15 DIAGNOSIS — I471 Supraventricular tachycardia, unspecified: Secondary | ICD-10-CM | POA: Diagnosis not present

## 2024-09-15 DIAGNOSIS — E782 Mixed hyperlipidemia: Secondary | ICD-10-CM

## 2024-09-15 MED ORDER — ROSUVASTATIN CALCIUM 20 MG PO TABS: 20.0000 mg | ORAL_TABLET | Freq: Every day | ORAL | 3 refills | Status: AC

## 2024-09-15 MED ORDER — DILTIAZEM HCL ER COATED BEADS 240 MG PO CP24: 240.0000 mg | ORAL_CAPSULE | Freq: Every day | ORAL | 3 refills | Status: AC

## 2024-09-15 NOTE — Telephone Encounter (Signed)
 Form faxed

## 2024-09-15 NOTE — Patient Instructions (Addendum)
 Medication Instructions:   START: Crestor  20mg  1 tablet daily  INCREASE: Cardizem  to 240mg  daily   Lab Work: Your physician recommends that you return for lab work in: 6 weeks You need to have labs done when you are fasting.  You can come Monday through Friday 8:30 am to 12:00 pm and 1:15 to 4:30. You do not need to make an appointment as the order has already been placed. The labs you are going to have done are Lipids, Liver function tests, LPa   Testing/Procedures:  WHY IS MY DOCTOR PRESCRIBING ZIO? The Zio system is proven and trusted by physicians to detect and diagnose irregular heart rhythms -- and has been prescribed to hundreds of thousands of patients.  The FDA has cleared the Zio system to monitor for many different kinds of irregular heart rhythms. In a study, physicians were able to reach a diagnosis 90% of the time with the Zio system1.  You can wear the Zio monitor -- a small, discreet, comfortable patch -- during your normal day-to-day activity, including while you sleep, shower, and exercise, while it records every single heartbeat for analysis.  1Barrett, P., et al. Comparison of 24 Hour Holter Monitoring Versus 14 Day Novel Adhesive Patch Electrocardiographic Monitoring. American Journal of Medicine, 2014.  ZIO VS. HOLTER MONITORING The Zio monitor can be comfortably worn for up to 14 days. Holter monitors can be worn for 24 to 48 hours, limiting the time to record any irregular heart rhythms you may have. Zio is able to capture data for the 51% of patients who have their first symptom-triggered arrhythmia after 48 hours.1  LIVE WITHOUT RESTRICTIONS The Zio ambulatory cardiac monitor is a small, unobtrusive, and water-resistant patch--you might even forget you're wearing it. The Zio monitor records and stores every beat of your heart, whether you're sleeping, working out, or showering.     Follow-Up: At [facility], you and your health needs are our priority.  As  part of our continuing mission to provide you with exceptional heart care, we have created designated Provider Care Teams.  These Care Teams include your primary Cardiologist (physician) and Advanced Practice Providers (APPs -  Physician Assistants and Nurse Practitioners) who all work together to provide you with the care you need, when you need it.  We recommend signing up for the patient portal called MyChart.  Sign up information is provided on this After Visit Summary.  MyChart is used to connect with patients for Virtual Visits (Telemedicine).  Patients are able to view lab/test results, encounter notes, upcoming appointments, etc.  Non-urgent messages can be sent to your provider as well.   To learn more about what you can do with MyChart, go to [email or web address].    Your next appointment:   6 month(s)  The format for your next appointment:   In Person  Provider:   Lamar Fitch, MD    Other Instructions Research [facility]pa with insurance

## 2024-09-17 ENCOUNTER — Ambulatory Visit: Payer: Self-pay | Admitting: Family Medicine

## 2024-09-19 ENCOUNTER — Encounter: Payer: Self-pay | Admitting: Cardiology

## 2024-10-05 DIAGNOSIS — R002 Palpitations: Secondary | ICD-10-CM

## 2024-10-06 ENCOUNTER — Ambulatory Visit: Payer: Self-pay | Admitting: Cardiology

## 2024-11-02 ENCOUNTER — Ambulatory Visit (INDEPENDENT_AMBULATORY_CARE_PROVIDER_SITE_OTHER): Admitting: Licensed Clinical Social Worker

## 2024-11-02 DIAGNOSIS — F1020 Alcohol dependence, uncomplicated: Secondary | ICD-10-CM

## 2024-11-02 DIAGNOSIS — F422 Mixed obsessional thoughts and acts: Secondary | ICD-10-CM

## 2024-11-02 NOTE — Progress Notes (Signed)
 THERAPIST PROGRESS NOTE  Session Time: 2:04 p.m. to 3:10 p.m.   Type of Therapy: Individual   Therapist Response/Interventions: Solution Focused/the therapist educates Martha Hampton and her husband regarding alcoholism as a disease and the fact that it is a central nervous system depression. The therapist informs her that her OCD and sleep problems cannot be successfully treated without her first getting safely off the alcohol via inpatient detox explaining the risks of trying this on her own. The therapist informs her that her insurance should cover it as it would any other medical condition.   Treatment Goals addressed: Martha Hampton wants to quit drinking; the therapist does not complete as Treatment Plan as she [place] about [place]half away and wants to seek services closer to where she lives  Summary: Martha Hampton presents today having been referred by her PCP. 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     She says that her doctor changed her from Paxil  to Prozac  recently and that it helped some. Martha Hampton says that her problems started 9-10 years ago after her husband of 30 years, not her current husband, cheated on her. She was put on Paxil  and was introduced to wine by a neighbor with Martha Hampton saying that she did not drink or do any drugs prior to this.   Martha Hampton's maternal grandfather was a really bad alcoholic and her paternal grandfather drank every day and her paternal aunts and uncles were frequently drunk.   Her OCD started around age 89 as she had to have everything even and it would take her hours to walk down a hallway. She had fears of contamination and could not get out of the bathroom due to excessive handwashing. She says that her OCD is better than when she was a child but still has obsessions and compulsions such as having to have  the radio on the car on an even number.   Martha Hampton and her husband live at [facility] where she says everybody drinks. Martha Hampton started out drinking two bottle of wine per day but now drinks vodka consuming about 39 standard drinks in a 3 day period. When she has tried to stop drinking previously, her hands will shake so badly that she cannot hold a fork. 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 She last drank alcohol last night.   Her PCP put her on Zepbound  for diabetes and alcohol cravings but her insurance would not afford it. Martha Hampton presents today with her main goal to get an anti-craving medication; however, the therapist informs her that per ASAM Placement Criteria that she needs to seek inpatient detox to safely get off the alcohol explaining the life threatening risks associated with trying to do this on her own.   Progress Towards Goals: Initial  Suicidal/Homicidal: No SI or HI  Plan: Martha Hampton and her husband will call her insurance company to get a list of detox facilities that are in-network and closer to where they live in [place], [place]. They will call this therapist back for assistance on a p.r.n. basis.   Diagnosis: Alcohol Use Disorder, Severe and OCD  Collaboration of Care: Other husband attends the session with Martha Hampton's permission providing history and encouraging her to go to inpatient detox  Patient/Guardian was advised Release of Information must be obtained prior to any record release in order to collaborate  their care with an outside provider. Patient/Guardian was advised if they have not already done so to contact the registration department to sign all necessary forms in order for us  to release information regarding their care.   Consent: Patient/Guardian gives verbal consent for treatment and assignment of benefits for services provided during this visit. Patient/Guardian expressed understanding and agreed to proceed.   Zell Maier, MA, LCSW, [facility], LCAS 11/02/2024

## 2024-11-03 ENCOUNTER — Telehealth: Payer: Self-pay

## 2024-11-03 NOTE — Telephone Encounter (Signed)
 Copied from CRM #[id number]. Topic: Clinical - Prescription Issue >> Nov 03, 2024 10:55 AM Nessti S wrote: Reason for CRM: pt called to be prescribed naltrexone alcohol abuse because the last medicine was $600 and could not afford it. Call back number [phone]

## 2024-11-06 ENCOUNTER — Other Ambulatory Visit: Payer: Self-pay | Admitting: Family Medicine

## 2024-11-06 DIAGNOSIS — F1020 Alcohol dependence, uncomplicated: Secondary | ICD-10-CM

## 2024-11-06 MED ORDER — NALTREXONE HCL 50 MG PO TABS
50.0000 mg | ORAL_TABLET | Freq: Every day | ORAL | 0 refills | Status: AC
Start: 2024-11-06 — End: ?

## 2024-11-06 NOTE — Telephone Encounter (Unsigned)
 Copied from CRM #[id number]. Topic: Clinical - Prescription Issue >> Nov 03, 2024 10:55 AM Nessti S wrote: Reason for CRM: pt called to be prescribed naltrexone alcohol abuse because the last medicine was $600 and could not afford it. Call back number [phone] >> Nov 06, 2024 11:44 AM Carlyon D wrote: Pt is calling again this time very upset that she still has not heard from some one in regards to the medication she is asking for and also that there is nothing sent to pharmacy for this medication please reach out to pr or send medication to pharmacy for pt .  >> Nov 03, 2024  3:31 PM Drema MATSU wrote: Patient was calling to get an update.

## 2024-11-06 NOTE — Telephone Encounter (Addendum)
 Per Dr. Antonio- naltrexone rx sent, needs follow-up in December. Tried calling Pt, no answer, unable to leave message.

## 2024-12-15 ENCOUNTER — Telehealth: Payer: Self-pay | Admitting: *Deleted

## 2024-12-15 NOTE — Telephone Encounter (Signed)
 Copied from CRM #[id number]. Topic: General - Other >> Dec 15, 2024 11:03 AM Alfonso HERO wrote: Reason for CRM: patient took a home COVID test and it is positive and she is out of town wanting to know if something can be called in for her. The pharmacy near her is listed below:  [facility] [address] [place] [address]  Phone [phone] Fax [phone]

## 2024-12-15 NOTE — Telephone Encounter (Addendum)
 Spoke with patient and advised that she can do E-visit and that we cannot do virtual due to her being out of town.  Pt was very upset that we were unable to just help and give her any medication for covid.  She stated that she has been a pt of Dr. Jennefer over 20 something years and that Dr. Antonio is going to be upset that we did not help her.  Also stated that she did not know that [place] and [place] of town.  I also advised that Dr. Antonio is out of the office this week.

## 2024-12-18 ENCOUNTER — Ambulatory Visit: Payer: Self-pay

## 2024-12-18 NOTE — Telephone Encounter (Signed)
 FYI Only or Action Required?: Action required by provider: clinical question for provider.  Patient was last seen in primary care on 09/12/2024 by Antonio Meth, Jamee SAUNDERS, DO.  Called Nurse Triage reporting Cough.  Symptoms began a week ago.  Interventions attempted: Rest, hydration, or home remedies.  Symptoms are: unchanged.Calling again for virtual visit or to have PCP call in medication. Still out of the states. She had done this before for me. Please advise pt. Tested positive for COVID last week.  Triage Disposition: See HCP Within 4 Hours (Or PCP Triage)  Patient/caregiver understands and will follow disposition?: No, wishes to speak with PCP      Copied from CRM #[id number]. Topic: Clinical - Red Word Triage >> Dec 18, 2024 11:07 AM Jayma L wrote: Red Word that prompted transfer to Nurse Triage: Been so sick , sore throat, coughing up yellow or green phlegm first thing in morning , ears popping and head pain from being stopped up. Gets dizzy from as well Reason for Disposition  [1] MILD difficulty breathing (e.g., minimal/no SOB at rest, SOB with walking, pulse < 100) AND [2] still present when not coughing  Answer Assessment - Initial Assessment Questions 1. ONSET: When did the cough begin?      Last week 2. SEVERITY: How bad is the cough today?      severe 3. SPUTUM: Describe the color of your sputum (e.g., none, dry cough; clear, white, yellow, green)    Sometimes yellow 4. HEMOPTYSIS: Are you coughing up any blood? If Yes, ask: How much? (e.g., flecks, streaks, tablespoons, etc.)     no 5. DIFFICULTY BREATHING: Are you having difficulty breathing? If Yes, ask: How bad is it? (e.g., mild, moderate, severe)      no 6. FEVER: Do you have a fever? If Yes, ask: What is your temperature, how was it measured, and when did it start?     no 7. CARDIAC HISTORY: Do you have any history of heart disease? (e.g., heart attack, congestive heart failure)       HTN 8. LUNG HISTORY: Do you have any history of lung disease?  (e.g., pulmonary embolus, asthma, emphysema)     no 9. PE RISK FACTORS: Do you have a history of blood clots? (or: recent major surgery, recent prolonged travel, bedridden)     no 10. OTHER SYMPTOMS: Do you have any other symptoms? (e.g., runny nose, wheezing, chest pain)       COVID positive, sore throat, nose stopped up 11. PREGNANCY: Is there any chance you are pregnant? When was your last menstrual period?       no 12. TRAVEL: Have you traveled out of the country in the last month? (e.g., travel history, exposures)       no  Protocols used: Cough - Acute Non-Productive-A-AH

## 2024-12-18 NOTE — Telephone Encounter (Signed)
 Pt was made aware on 12/26 that we are unable to rx medication since she is out of state

## 2025-01-01 ENCOUNTER — Ambulatory Visit: Admitting: Family Medicine
